# Patient Record
Sex: Female | Born: 1960 | Race: White | Hispanic: No | Marital: Married | Smoking: Never smoker
Health system: Southern US, Community
[De-identification: ages and names within clinical notes are randomized; demographics above are authoritative.]

## PROBLEM LIST (undated history)

## (undated) DIAGNOSIS — K610 Anal abscess: Secondary | ICD-10-CM

## (undated) DIAGNOSIS — R32 Unspecified urinary incontinence: Secondary | ICD-10-CM

## (undated) DIAGNOSIS — G4730 Sleep apnea, unspecified: Secondary | ICD-10-CM

## (undated) DIAGNOSIS — J309 Allergic rhinitis, unspecified: Secondary | ICD-10-CM

## (undated) DIAGNOSIS — I872 Venous insufficiency (chronic) (peripheral): Secondary | ICD-10-CM

## (undated) DIAGNOSIS — K219 Gastro-esophageal reflux disease without esophagitis: Secondary | ICD-10-CM

## (undated) DIAGNOSIS — N1330 Unspecified hydronephrosis: Secondary | ICD-10-CM

## (undated) DIAGNOSIS — T8859XA Other complications of anesthesia, initial encounter: Secondary | ICD-10-CM

## (undated) DIAGNOSIS — N200 Calculus of kidney: Secondary | ICD-10-CM

## (undated) DIAGNOSIS — Z8669 Personal history of other diseases of the nervous system and sense organs: Secondary | ICD-10-CM

## (undated) DIAGNOSIS — Z87442 Personal history of urinary calculi: Secondary | ICD-10-CM

## (undated) DIAGNOSIS — Z860100 Personal history of colon polyps, unspecified: Secondary | ICD-10-CM

## (undated) DIAGNOSIS — E559 Vitamin D deficiency, unspecified: Secondary | ICD-10-CM

## (undated) DIAGNOSIS — G4733 Obstructive sleep apnea (adult) (pediatric): Secondary | ICD-10-CM

## (undated) DIAGNOSIS — N809 Endometriosis, unspecified: Secondary | ICD-10-CM

## (undated) DIAGNOSIS — R319 Hematuria, unspecified: Secondary | ICD-10-CM

## (undated) DIAGNOSIS — C801 Malignant (primary) neoplasm, unspecified: Secondary | ICD-10-CM

## (undated) DIAGNOSIS — K649 Unspecified hemorrhoids: Secondary | ICD-10-CM

## (undated) DIAGNOSIS — T4145XA Adverse effect of unspecified anesthetic, initial encounter: Secondary | ICD-10-CM

## (undated) DIAGNOSIS — M1990 Unspecified osteoarthritis, unspecified site: Secondary | ICD-10-CM

## (undated) DIAGNOSIS — T7840XA Allergy, unspecified, initial encounter: Secondary | ICD-10-CM

## (undated) HISTORY — DX: Personal history of colon polyps, unspecified: Z86.0100

## (undated) HISTORY — PX: LAPAROSCOPIC GASTRIC BANDING: SHX1100

## (undated) HISTORY — DX: Unspecified hydronephrosis: N13.30

## (undated) HISTORY — DX: Allergy, unspecified, initial encounter: T78.40XA

## (undated) HISTORY — DX: Vitamin D deficiency, unspecified: E55.9

## (undated) HISTORY — PX: VAGINAL HYSTERECTOMY: SUR661

## (undated) HISTORY — DX: Allergic rhinitis, unspecified: J30.9

## (undated) HISTORY — DX: Calculus of kidney: N20.0

## (undated) HISTORY — DX: Malignant (primary) neoplasm, unspecified: C80.1

## (undated) HISTORY — DX: Unspecified osteoarthritis, unspecified site: M19.90

## (undated) HISTORY — DX: Personal history of colonic polyps: Z86.010

## (undated) HISTORY — DX: Sleep apnea, unspecified: G47.30

## (undated) HISTORY — PX: BREAST CYST EXCISION: SHX579

## (undated) HISTORY — DX: Unspecified urinary incontinence: R32

## (undated) HISTORY — PX: DILATION AND CURETTAGE OF UTERUS: SHX78

## (undated) HISTORY — DX: Endometriosis, unspecified: N80.9

## (undated) HISTORY — DX: Obstructive sleep apnea (adult) (pediatric): G47.33

## (undated) HISTORY — DX: Anal abscess: K61.0

## (undated) HISTORY — DX: Unspecified hemorrhoids: K64.9

## (undated) HISTORY — DX: Venous insufficiency (chronic) (peripheral): I87.2

## (undated) HISTORY — PX: HERNIA REPAIR: SHX51

## (undated) HISTORY — DX: Gastro-esophageal reflux disease without esophagitis: K21.9

## (undated) HISTORY — PX: JOINT REPLACEMENT: SHX530

## (undated) HISTORY — PX: SMALL INTESTINE SURGERY: SHX150

## (undated) HISTORY — PX: ABDOMINAL SURGERY: SHX537

## (undated) HISTORY — PX: LAPAROSCOPIC CHOLECYSTECTOMY: SUR755

## (undated) HISTORY — PX: COLONOSCOPY W/ POLYPECTOMY: SHX1380

## (undated) HISTORY — PX: HIATAL HERNIA REPAIR: SHX195

---

## 1999-11-24 ENCOUNTER — Encounter: Payer: Self-pay | Admitting: Endocrinology

## 1999-11-24 ENCOUNTER — Ambulatory Visit (HOSPITAL_COMMUNITY): Admission: RE | Admit: 1999-11-24 | Discharge: 1999-11-24 | Payer: Self-pay | Admitting: Endocrinology

## 2000-02-27 ENCOUNTER — Ambulatory Visit (HOSPITAL_COMMUNITY): Admission: RE | Admit: 2000-02-27 | Discharge: 2000-02-28 | Payer: Self-pay | Admitting: General Surgery

## 2000-02-27 ENCOUNTER — Encounter: Payer: Self-pay | Admitting: General Surgery

## 2000-02-27 ENCOUNTER — Encounter (INDEPENDENT_AMBULATORY_CARE_PROVIDER_SITE_OTHER): Payer: Self-pay | Admitting: *Deleted

## 2005-12-18 ENCOUNTER — Ambulatory Visit: Payer: Self-pay | Admitting: Internal Medicine

## 2005-12-25 ENCOUNTER — Ambulatory Visit: Payer: Self-pay | Admitting: Internal Medicine

## 2007-03-05 ENCOUNTER — Ambulatory Visit: Payer: Self-pay | Admitting: Internal Medicine

## 2007-03-06 ENCOUNTER — Encounter: Payer: Self-pay | Admitting: Internal Medicine

## 2007-03-06 LAB — CONVERTED CEMR LAB
ALT: 30 units/L (ref 0–40)
AST: 25 units/L (ref 0–37)
Alkaline Phosphatase: 59 units/L (ref 39–117)
BUN: 13 mg/dL (ref 6–23)
CO2: 27 meq/L (ref 19–32)
Chloride: 106 meq/L (ref 96–112)
Creatinine, Ser: 0.7 mg/dL (ref 0.4–1.2)
Nitrite: NEGATIVE
Potassium: 3.9 meq/L (ref 3.5–5.1)
Pro B Natriuretic peptide (BNP): 28 pg/mL (ref 0.0–100.0)
Total Bilirubin: 0.7 mg/dL (ref 0.3–1.2)
Total Protein: 6.2 g/dL (ref 6.0–8.3)
Urine Glucose: NEGATIVE mg/dL

## 2007-03-12 ENCOUNTER — Encounter: Admission: RE | Admit: 2007-03-12 | Discharge: 2007-03-12 | Payer: Self-pay | Admitting: Internal Medicine

## 2007-03-13 ENCOUNTER — Ambulatory Visit: Payer: Self-pay

## 2007-04-02 ENCOUNTER — Ambulatory Visit: Payer: Self-pay | Admitting: Internal Medicine

## 2007-05-28 ENCOUNTER — Ambulatory Visit: Payer: Self-pay | Admitting: Internal Medicine

## 2007-06-26 ENCOUNTER — Ambulatory Visit: Payer: Self-pay | Admitting: Internal Medicine

## 2007-06-26 LAB — CONVERTED CEMR LAB
ALT: 50 units/L — ABNORMAL HIGH (ref 0–35)
Albumin: 3.7 g/dL (ref 3.5–5.2)
Alkaline Phosphatase: 64 units/L (ref 39–117)
BUN: 17 mg/dL (ref 6–23)
Basophils Relative: 0.5 % (ref 0.0–1.0)
Calcium: 9.1 mg/dL (ref 8.4–10.5)
Direct LDL: 147.3 mg/dL
Eosinophils Relative: 1.8 % (ref 0.0–5.0)
GFR calc non Af Amer: 72 mL/min
Glucose, Bld: 113 mg/dL — ABNORMAL HIGH (ref 70–99)
HDL: 30 mg/dL — ABNORMAL LOW (ref >39.0)
Iron: 94 ug/dL (ref 42–145)
Monocytes Relative: 8.1 % (ref 3.0–11.0)
Platelets: 282 10*3/uL (ref 150–400)
Potassium: 4.3 meq/L (ref 3.5–5.1)
RBC: 4.57 M/uL (ref 3.87–5.11)
RDW: 13.3 % (ref 11.5–14.6)
TSH: 2.16 microintl units/mL (ref 0.35–5.50)
Total Bilirubin: 0.8 mg/dL (ref 0.3–1.2)
Total CHOL/HDL Ratio: 7.1
Total Protein: 6.8 g/dL (ref 6.0–8.3)
Triglycerides: 189 mg/dL — ABNORMAL HIGH (ref 0–149)
VLDL: 38 mg/dL (ref 0–40)
WBC: 8.2 10*3/uL (ref 4.5–10.5)

## 2007-07-02 ENCOUNTER — Ambulatory Visit: Payer: Self-pay | Admitting: Internal Medicine

## 2007-07-05 ENCOUNTER — Encounter: Payer: Self-pay | Admitting: Internal Medicine

## 2007-07-05 DIAGNOSIS — I872 Venous insufficiency (chronic) (peripheral): Secondary | ICD-10-CM | POA: Insufficient documentation

## 2007-07-05 DIAGNOSIS — N1330 Unspecified hydronephrosis: Secondary | ICD-10-CM

## 2007-07-05 DIAGNOSIS — G4733 Obstructive sleep apnea (adult) (pediatric): Secondary | ICD-10-CM | POA: Insufficient documentation

## 2007-07-05 DIAGNOSIS — E559 Vitamin D deficiency, unspecified: Secondary | ICD-10-CM

## 2007-07-05 DIAGNOSIS — K8020 Calculus of gallbladder without cholecystitis without obstruction: Secondary | ICD-10-CM | POA: Insufficient documentation

## 2007-07-05 HISTORY — DX: Venous insufficiency (chronic) (peripheral): I87.2

## 2007-07-09 ENCOUNTER — Ambulatory Visit (HOSPITAL_COMMUNITY): Admission: RE | Admit: 2007-07-09 | Discharge: 2007-07-09 | Payer: Self-pay | Admitting: *Deleted

## 2007-07-16 ENCOUNTER — Ambulatory Visit (HOSPITAL_COMMUNITY): Admission: RE | Admit: 2007-07-16 | Discharge: 2007-07-16 | Payer: Self-pay | Admitting: *Deleted

## 2007-07-31 ENCOUNTER — Encounter: Admission: RE | Admit: 2007-07-31 | Discharge: 2007-10-29 | Payer: Self-pay | Admitting: *Deleted

## 2007-08-28 ENCOUNTER — Ambulatory Visit (HOSPITAL_COMMUNITY): Admission: RE | Admit: 2007-08-28 | Discharge: 2007-08-28 | Payer: Self-pay | Admitting: *Deleted

## 2007-11-27 HISTORY — PX: LAPAROSCOPIC GASTRIC BANDING: SHX1100

## 2007-12-25 ENCOUNTER — Encounter: Admission: RE | Admit: 2007-12-25 | Discharge: 2008-03-24 | Payer: Self-pay | Admitting: *Deleted

## 2008-01-08 ENCOUNTER — Encounter: Payer: Self-pay | Admitting: Internal Medicine

## 2008-01-12 ENCOUNTER — Ambulatory Visit (HOSPITAL_COMMUNITY): Admission: RE | Admit: 2008-01-12 | Discharge: 2008-01-13 | Payer: Self-pay | Admitting: *Deleted

## 2008-02-02 ENCOUNTER — Encounter: Payer: Self-pay | Admitting: Internal Medicine

## 2008-02-06 ENCOUNTER — Ambulatory Visit: Payer: Self-pay | Admitting: Internal Medicine

## 2008-02-13 LAB — CONVERTED CEMR LAB
ALT: 74 units/L — ABNORMAL HIGH (ref 0–35)
AST: 39 units/L — ABNORMAL HIGH (ref 0–37)
Alkaline Phosphatase: 65 units/L (ref 39–117)
BUN: 11 mg/dL (ref 6–23)
Bilirubin, Direct: 0.1 mg/dL (ref 0.0–0.3)
Calcium: 9.1 mg/dL (ref 8.4–10.5)
Chloride: 108 meq/L (ref 96–112)
GFR calc Af Amer: 116 mL/min
GFR calc non Af Amer: 96 mL/min
Glucose, Bld: 97 mg/dL (ref 70–99)

## 2008-03-26 ENCOUNTER — Ambulatory Visit: Payer: Self-pay | Admitting: Internal Medicine

## 2008-03-26 DIAGNOSIS — F4321 Adjustment disorder with depressed mood: Secondary | ICD-10-CM | POA: Insufficient documentation

## 2008-03-26 DIAGNOSIS — M1990 Unspecified osteoarthritis, unspecified site: Secondary | ICD-10-CM | POA: Insufficient documentation

## 2008-03-26 DIAGNOSIS — Z9884 Bariatric surgery status: Secondary | ICD-10-CM

## 2008-03-26 DIAGNOSIS — J309 Allergic rhinitis, unspecified: Secondary | ICD-10-CM | POA: Insufficient documentation

## 2008-08-10 ENCOUNTER — Ambulatory Visit: Payer: Self-pay | Admitting: Internal Medicine

## 2008-08-10 DIAGNOSIS — J019 Acute sinusitis, unspecified: Secondary | ICD-10-CM

## 2008-08-10 DIAGNOSIS — N63 Unspecified lump in unspecified breast: Secondary | ICD-10-CM

## 2008-08-13 ENCOUNTER — Telehealth (INDEPENDENT_AMBULATORY_CARE_PROVIDER_SITE_OTHER): Payer: Self-pay | Admitting: *Deleted

## 2008-08-19 ENCOUNTER — Encounter: Payer: Self-pay | Admitting: Internal Medicine

## 2008-09-13 ENCOUNTER — Telehealth: Payer: Self-pay | Admitting: Internal Medicine

## 2008-09-14 ENCOUNTER — Ambulatory Visit: Payer: Self-pay | Admitting: Internal Medicine

## 2008-09-14 DIAGNOSIS — L02219 Cutaneous abscess of trunk, unspecified: Secondary | ICD-10-CM

## 2008-09-14 DIAGNOSIS — L03319 Cellulitis of trunk, unspecified: Secondary | ICD-10-CM

## 2008-10-14 ENCOUNTER — Encounter: Payer: Self-pay | Admitting: Internal Medicine

## 2009-05-11 ENCOUNTER — Encounter: Admission: RE | Admit: 2009-05-11 | Discharge: 2009-05-11 | Payer: Self-pay | Admitting: Surgery

## 2009-07-26 ENCOUNTER — Ambulatory Visit (HOSPITAL_COMMUNITY): Admission: RE | Admit: 2009-07-26 | Discharge: 2009-07-26 | Payer: Self-pay | Admitting: Surgery

## 2010-04-11 ENCOUNTER — Ambulatory Visit: Payer: Self-pay | Admitting: Internal Medicine

## 2010-04-14 ENCOUNTER — Encounter: Payer: Self-pay | Admitting: Internal Medicine

## 2010-11-26 HISTORY — PX: OTHER SURGICAL HISTORY: SHX169

## 2010-12-28 NOTE — Letter (Signed)
Summary: [facility]   Imported By: Lester Boise 04/26/2010 11:04:31  _____________________________________________________________________  External Attachment:    Type:   Image     Comment:   External Document

## 2010-12-28 NOTE — Assessment & Plan Note (Signed)
Summary: SINUS/NWS   Vital Signs:  Patient profile:   50 year old female Weight:      247.50 pounds O2 Sat:      98 % on Room air Temp:     98.7 degrees F oral Pulse rate:   59 / minute BP sitting:   130 / 74  (left arm) Cuff size:   regular  Vitals Entered By: Lucious Groves (Apr 11, 2010 9:07 AM)  O2 Flow:  Room air CC: C/O sinus issues x4 days./kb Is Patient Diabetic? No Pain Assessment Patient in pain? no      Comments Patient notes dizziness with taking Sudafed and that she has had fever, HA, and yellow mucous production./kb   CC:  C/O sinus issues x4 days./kb.  History of Present Illness: The patient presents with complaints of sore throat, fever, sinus congestion and drainge of several days duration. Not better with OTC meds.  Muscle aches are not  present.  The mucus is colored. C/o allergies. F/u obesity - she had to have a lapband repaired last August   Current Medications (verified): 1)  None  Allergies (verified): 1)  Biaxin  Past History:  Past Medical History: Last updated: 03/26/2008 Vit D def LE ven insuff Elev LFTs ? fatty liver Osteoarthritis Allergic rhinitis Morbid obesity OSA on Cpap  Past Surgical History: Hysterectomy Lap band 2009 w/a repair in 2010  Social History: Reviewed history from 03/26/2008 and no changes required. Occupation:post office Married Never Smoked  Review of Systems       The patient complains of fever.  The patient denies chest pain, prolonged cough, and abdominal pain.         Lost wt  Physical Exam  General:  overweight-appearing.   Nose:  nasal dischargemucosal pallor and mucosal erythema.   Mouth:  pharyngeal erythema and fair dentition.   Lungs:  Normal respiratory effort, chest expands symmetrically. Lungs are clear to auscultation, no crackles or wheezes. Heart:  Normal rate and regular rhythm. S1 and S2 normal without gallop, murmur, click, rub or other extra sounds. Abdomen:  soft.   Skin:   WNL Psych:  Oriented X3.     Impression & Recommendations:  Problem # 1:  SINUSITIS- ACUTE-NOS (ICD-461.9) Assessment New Zpac  Problem # 2:  ALLERGIC RHINITIS (ICD-477.9) Assessment: Deteriorated  Her updated medication list for this problem includes:    Sudafed 12 Hour 120 Mg Xr12h-tab (Pseudoephedrine hcl) .Marland Kitchen... 1 by mouth two times a day as needed allergies    Loratadine 10 Mg Tabs (Loratadine) .Marland Kitchen... 1 by mouth once daily as needed allergies  Problem # 3:  OBESITY, MORBID (ICD-278.01) Assessment: Improved  Problem # 4:  VITAMIN D DEFICIENCY (ICD-268.9) Assessment: Comment Only Restart Vit D Risks of noncompliance with treatment discussed. Compliance encouraged.   Complete Medication List: 1)  Vitamin D 1000 Unit Tabs (Cholecalciferol) .Marland Kitchen.. 1 by mouth qd 2)  Ibuprofen 600 Mg Tabs (Ibuprofen) .Marland Kitchen.. 1 by mouth bid  pc x 1 wk then as needed for  pain 3)  Zithromax Z-pak 250 Mg Tabs (Azithromycin) .... As dirrected 4)  Sudafed 12 Hour 120 Mg Xr12h-tab (Pseudoephedrine hcl) .Marland Kitchen.. 1 by mouth two times a day as needed allergies 5)  Loratadine 10 Mg Tabs (Loratadine) .Marland Kitchen.. 1 by mouth once daily as needed allergies  Patient Instructions: 1)  Use over-the-counter medicines for "cold": Tylenol  650mg  or Advil 400mg  every 6 hours  for fever; Delsym or Robutussin for cough. Mucinex or Mucinex D for congestion.  Ricola or Halls for sore throat. Office visit if not better or if worse. 2)  Sch wellness Prescriptions: LORATADINE 10 MG TABS (LORATADINE) 1 by mouth once daily as needed allergies  #30 x 6   Entered and Authorized by:   Tresa Garter MD   Signed by:   Tresa Garter MD on 04/11/2010   Method used:   Print then Give to Patient   RxID:   [id number] SUDAFED 12 HOUR 120 MG XR12H-TAB (PSEUDOEPHEDRINE HCL) 1 by mouth two times a day as needed allergies  #60 x 6   Entered and Authorized by:   Tresa Garter MD   Signed by:   Tresa Garter MD on 04/11/2010    Method used:   Print then Give to Patient   RxID:   [id number] ZITHROMAX Z-PAK 250 MG TABS (AZITHROMYCIN) as dirrected  #1 x 0   Entered and Authorized by:   Tresa Garter MD   Signed by:   Tresa Garter MD on 04/11/2010   Method used:   Print then Give to Patient   RxID:   [id number] IBUPROFEN 600 MG TABS (IBUPROFEN) 1 by mouth bid  pc x 1 wk then as needed for  pain  #60 x 6   Entered and Authorized by:   Tresa Garter MD   Signed by:   Tresa Garter MD on 04/11/2010   Method used:   Print then Give to Patient   RxID:   [id number] VITAMIN D 1000 UNIT TABS (CHOLECALCIFEROL) 1 by mouth qd  #100 x 3   Entered and Authorized by:   Tresa Garter MD   Signed by:   Tresa Garter MD on 04/11/2010   Method used:   Print then Give to Patient   RxID:   [id number]

## 2011-03-03 LAB — BASIC METABOLIC PANEL
BUN: 11 mg/dL (ref 6–23)
CO2: 25 mEq/L (ref 19–32)
Chloride: 107 mEq/L (ref 96–112)
Glucose, Bld: 103 mg/dL — ABNORMAL HIGH (ref 70–99)
Potassium: 4.3 mEq/L (ref 3.5–5.1)
Sodium: 138 mEq/L (ref 135–145)

## 2011-04-10 NOTE — Op Note (Signed)
Latasha, Hicks            ACCOUNT NO.:  [id number]   MEDICAL RECORD NO.:  [id number]          PATIENT TYPE:  AMB   LOCATION:  DAY                          FACILITY:  [facility]   PHYSICIAN:  Thornton Park. Daphine Deutscher, MD  DATE OF BIRTH:  1961/05/19   DATE OF PROCEDURE:  07/26/2009  DATE OF DISCHARGE:                               OPERATIVE REPORT   PREOPERATIVE DIAGNOSIS:  Leaking lap band port.   POSTOPERATIVE DIAGNOSIS:  Leaking lap band port.   PROCEDURE:  Replacement of lap band port with removal of skin tags  beneath the breast and axilla.   SURGEON:  Dr. Daphine Deutscher   ASSISTANT:  Dr. Ezzard Standing   ANESTHESIA:  General.   DESCRIPTION OF PROCEDURE:  The patient was taken to room one on 08/31  and given general anesthesia.  The abdomen was prepped with a Techni-  Care equivalent and draped sterilely.  First I excised her old scar over  her band port and went down and explanted this.  It was covered in a  pretty much hard fibrin sheath and I went ahead and freed this up.  When  I had it freed from the connection, I attempted to fill and withdrawn  and it leaked out and it was there that I noticed that the port and its  connection tubing which is all the single piece that comes separately  had a lateral opening in the tubing.  I removed this and got a fresh  port and tubing and amputated a little farther down on the joint and  then reinstalled it.  The band was then flushed with about 10 mL of  saline and the bulk of that was removed.  I left about 4-5 mL in the  band.  The Marlex mesh was sutured to the back of the port which was  then implanted in a subcutaneous pocket just lateral to the lateral edge  of the port site.  The wounds were closed with 4-0 Vicryl, Benzoin and  Steri-Strips.   Next I snipped off about 16 little skin tags beneath both breasts and in  the axilla.  These were dressed with Neosporin ointment.  The patient  tolerated the procedure well and was taken to the   recovery room in  satisfactory condition.  She will be given some Lortab elixir to take if  needed for pain.      Thornton Park Daphine Deutscher, MD  Electronically Signed     MBM/MEDQ  D:  07/26/2009  T:  07/26/2009  Job:  [id number]

## 2011-04-10 NOTE — Letter (Signed)
May 17, 2007    [facility]  Attn:  Okey Regal  [address]  [place], [place] [address]   RE:  Latasha Hicks, Latasha Hicks  MRN:  [id number]  /  DOB:  21-Jun-1961   Ms. Latasha Hicks has been a patient of mine for a number of years.  She has  been suffering with morbid obesity.  I saw her last on Apr 02, 2007.  Her  weight was 312 pounds with a height of 5 feet 7 inches and a body mass  index around 50.  She has developed lower extremity chronic venous  insufficiency, sleep apnea, and elevated blood pressure as the result of  her morbid obesity.  She is highly motivated to have a lap-band surgery.  I strongly support her in this endeavor and highly recommend a lap-band  surgery.  She is very motivated.    Sincerely,      Latasha Quint. Plotnikov, MD  Electronically Signed    AVP/MedQ  DD: 05/17/2007  DT: 05/17/2007  Job #: [id number]

## 2011-04-10 NOTE — Op Note (Signed)
NAMELILYANN, Latasha Hicks            ACCOUNT NO.:  [id number]   MEDICAL RECORD NO.:  [id number]          PATIENT TYPE:  OIB   LOCATION:  1527                         FACILITY:  [facility]   PHYSICIAN:  Thornton Park. Daphine Deutscher, MD  DATE OF BIRTH:  February 21, 1961   DATE OF PROCEDURE:  01/12/2008  DATE OF DISCHARGE:                               OPERATIVE REPORT   PREOPERATIVE DIAGNOSIS:  Morbid obesity with obstructive sleep apnea,  hypertension, osteoarthritis and a hiatal hernia by upper GI.   PROCEDURE:  Laparoscopic repair of hiatal hernia posteriorly and  placement of APS band.   SURGEON:  Luretha Murphy, M.D.   ASSISTANT:  Lorne Skeens. Hoxworth, M.D.   ANESTHESIA:  General endotracheal.   DESCRIPTION OF PROCEDURE:  [name] is a 50 year old lady  brought to OR 1 on January 12, 2008 and given general anesthesia.  The  abdomen was prepped with Techni-Care and draped sterilely.  I entered  the abdomen through the left upper quadrant using 0 degrees 10 mm scope  with OptiVu technique without difficulty.  The abdomen was insufflated.  The standard trocar placements were used including a 15 in the right  upper port to just slightly to the right of midline for introduction of  the band.  The Nathanson's retractor was placed beneath the liver.  The  patient was then placed in steep head up.  Proximal dissection was done  using the harmonic scalpel and the esophagogastric junction was noted  and taken down.  We reduced an anterior and a posterior large lipoma  occupying a lot of the hiatus but allowing her to have a sliding hiatal  hernia.  These were removed.  We then repaired this with two posterior  sutures where I could see the hiatus both the right and left.  These  were tied and brought nice coaptation of the posterior hiatus.   I then passed the band passer in the usual spot in the lower port on the  right, easily coming through and then brought the APS band around.  This  was then  plicated anteriorly pulling a little bit of the fundus up  underneath that as it was riding a little higher.  Three sutures were  used to plicate the stomach up on top of the band.  It appeared to be in  good location and rotated easily.  We also used a band calibration tube  which was placed after snapping it to make sure it was not too tight.   It was then cut and brought out through the umbilicus where I had made a  pocket on the lower side and connected it to the port and then sewed  that in place with four interrupted 2-0 Prolenes.  Wounds were irrigated  with saline and I drizzled some lidocaine in each of the ports and these  were closed with 4-0 Vicryl and also with staples.  The patient seemed  to tolerate the procedure well and taken to recovery in satisfactory  addition.      Thornton Park Daphine Deutscher, MD  Electronically Signed     MBM/MEDQ  D:  01/12/2008  T:  01/13/2008  Job:  [id number]   cc:   Georgina Quint. Plotnikov, MD  [address]  [place]  [place] [address]

## 2011-04-13 NOTE — Op Note (Signed)
[place]  Patient:    Latasha Hicks, Latasha Hicks                   MRN: [id number] Proc. Date: 02/27/00 Adm. Date:  [id number] Attending:  Henrene Dodge CC:         Anselm Pancoast. Zachery Dakins, M.D.                           Operative Report  PREOPERATIVE DIAGNOSIS:  Chronic cholecystitis.  POSTOPERATIVE DIAGNOSIS:  Chronic cholecystitis.  OPERATION:  Laparoscopic cholecystectomy with cholangiogram.  SURGEON:  Anselm Pancoast. Zachery Dakins, M.D.  ASSISTANT:  Donnie Coffin. Samuella Cota, M.D.  ANESTHESIA:  General.  INDICATIONS:  Latasha Hicks is a 50 year old overweight Caucasian female referred by the [facility] for management of symptomatic gallstones.  She has had a long history of indigestion with epigastric type discomfort, thought probably secondary to overweight and esophageal reflux, but then starting having more definite symptoms, had a slightly elevated SGPT and Dr. Posey Rea scheduled her for an ultrasound of the gallbladder.  The ultrasound showed stones within her gallbladder and she was referred to me for management of these gallbladder problems.  She is on a reflux type medications.  DESCRIPTION OF PROCEDURE:  Preoperative she was given 3 g of Unasyn and taken to operative suite.  She has PAS stockings.  The was patient given induction of general anesthesia and endotracheal tube and then her abdomen was prepped with Betadine and surgical scrubbing solution, draped in a sterile manner.  She has ad a previous laparoscopic GYN procedure and we went through the same incision. She has a very thick panniculus and abdominal wall.  The fascia was identified and his was picked up with the two [facility], a small opening make, the underlying peritoneum identified, and then opened into the peritoneal cavity.  The single traction suture was placed and Hasson cannula introduced and then the camera inserted.  The gallbladder was easily visualized, was not  acutely inflamed and no adhesions around it.  The upper 10 mm trocar was placed and the two lateral 5 mm trocars were placed by Dr. Samuella Cota.  Because of her size, the gallbladder was very difficult and we could retract it upward and outward and could identify the junction of the gallbladder and the cystic duct and the cystic duct was over about a cm in size and length, and I was able to put a clip on the gallbladder flush with the cystic duct.  What I  thought was the cystic artery was visualized and this was doubly clipped proximally and singly distally, and then we used a taupe cholangiocath to obtain and evacuate. There was good flow of the extrahepatic biliary system or prompt filling into the duodenum.  No evidence of any common duct stones and a very short cystic duct. On reinserting the camera there was a little bit of bleeding in the area of and at  this time we switched to a 30 degree scope so we could see the area better, and  took the little catheter out, triply and doubly clipped the cystic duct and divided it and then divided the little area where we thought was the cystic artery. This was really a little lymphatic and the artery was right behind it and I sort of freed it up and then doubly clipped it proximally, singly, distally and divided it. The little bleeding had been coming from a little area of the peritoneum  just lateral to the gallbladder ______ traction and we sort of teased the gallbladder out of the bed using a ______ hook electrocautery and was confident there was no evidence of any active bleeding.  At this point we switched back to the regular  straight viewing scope for better visualization and then when we had it free it the gallbladder controlling the bed with the cautery and then placed the gallbladder in EndoCatch bag.  Reinspection, all the irrigated fluid had been aspirated.  We then switched the camera to the upper 10 mm port and withdrew  the bag containing the  stones with the Hasson cannula.  There were fairly marble sized stones within the gallbladder and then we closed the fascia, the umbilicus with two figure-of-eight of 0 Vicryl.  Next, the little remaining irrigating fluid was removed.  She was  flattened and then we withdrew the carbon dioxide and then removed the upper 10 mm trocar.  The subcutaneous wounds were closed with 4-0 Vicryl, benzoin and Steri-Strips on the skin, and the patient was extubated and taken to recovery room in stable postop condition. DD:  02/27/00 TD:  02/27/00 Job: [id number]

## 2011-06-22 ENCOUNTER — Telehealth: Payer: Self-pay | Admitting: *Deleted

## 2011-06-22 DIAGNOSIS — N200 Calculus of kidney: Secondary | ICD-10-CM

## 2011-06-22 NOTE — Telephone Encounter (Signed)
Bring stone to lab pls Keep ROV Thx

## 2011-06-22 NOTE — Telephone Encounter (Signed)
Pt passed kidney stone this afternoon. She would like to know if it needs to be sent to lab? AND any further treatment or referral needed?

## 2011-06-25 NOTE — Telephone Encounter (Signed)
Left detailed vm on pt's home # OK per [facility] form.

## 2011-06-26 ENCOUNTER — Other Ambulatory Visit: Payer: Self-pay

## 2011-06-26 ENCOUNTER — Other Ambulatory Visit: Payer: Self-pay | Admitting: Internal Medicine

## 2011-07-01 LAB — STONE ANALYSIS: Stone Weight KSTONE: 0.011 g

## 2011-08-17 LAB — CBC
Hemoglobin: 13.2
MCHC: 35.1
RBC: 4.16
WBC: 9.6

## 2011-08-17 LAB — BASIC METABOLIC PANEL
BUN: 17
Chloride: 108
GFR calc Af Amer: >60
GFR calc non Af Amer: >60
Potassium: 4.8
Sodium: 143

## 2011-08-17 LAB — DIFFERENTIAL
Basophils Relative: 0
Lymphocytes Relative: 8 — ABNORMAL LOW
Monocytes Absolute: 0.5
Monocytes Relative: 5
Neutro Abs: 8.3 — ABNORMAL HIGH
Neutrophils Relative %: 87 — ABNORMAL HIGH

## 2011-08-17 LAB — HEMOGLOBIN AND HEMATOCRIT, BLOOD
HCT: 42
Hemoglobin: 14.5

## 2012-01-29 ENCOUNTER — Telehealth (INDEPENDENT_AMBULATORY_CARE_PROVIDER_SITE_OTHER): Payer: Self-pay | Admitting: Surgery

## 2012-01-29 NOTE — Telephone Encounter (Signed)
01/29/12 mailed recall letter for bariatric surgery f/u to patient. Adv pt to call [facility] @ [phone] to schedule an appointment....cef 

## 2013-03-05 ENCOUNTER — Telehealth (INDEPENDENT_AMBULATORY_CARE_PROVIDER_SITE_OTHER): Payer: Self-pay | Admitting: Surgery

## 2013-03-05 NOTE — Telephone Encounter (Signed)
03/05/13 lm and mailed recall letter for pt to schedule a bariatric follow-up appt. Dr. Daphine Deutscher did lap band surgery 01/12/08. (lss)

## 2013-05-01 ENCOUNTER — Other Ambulatory Visit (INDEPENDENT_AMBULATORY_CARE_PROVIDER_SITE_OTHER): Payer: Federal, State, Local not specified - PPO

## 2013-05-01 ENCOUNTER — Encounter: Payer: Self-pay | Admitting: Internal Medicine

## 2013-05-01 ENCOUNTER — Ambulatory Visit (INDEPENDENT_AMBULATORY_CARE_PROVIDER_SITE_OTHER): Payer: Federal, State, Local not specified - PPO | Admitting: Internal Medicine

## 2013-05-01 VITALS — BP 110/80 | HR 80 | Temp 98.5°F | Resp 16 | Ht 67.0 in | Wt 224.0 lb

## 2013-05-01 DIAGNOSIS — Z0000 Encounter for general adult medical examination without abnormal findings: Secondary | ICD-10-CM

## 2013-05-01 DIAGNOSIS — Z9884 Bariatric surgery status: Secondary | ICD-10-CM

## 2013-05-01 DIAGNOSIS — K610 Anal abscess: Secondary | ICD-10-CM

## 2013-05-01 DIAGNOSIS — K612 Anorectal abscess: Secondary | ICD-10-CM

## 2013-05-01 LAB — BASIC METABOLIC PANEL
BUN: 12 mg/dL (ref 6–23)
CO2: 26 mEq/L (ref 19–32)
Chloride: 108 mEq/L (ref 96–112)
Potassium: 4.4 mEq/L (ref 3.5–5.1)

## 2013-05-01 LAB — URINALYSIS
Ketones, ur: NEGATIVE
Leukocytes, UA: NEGATIVE
Specific Gravity, Urine: 1.025 (ref 1.000–1.030)
Total Protein, Urine: NEGATIVE
Urine Glucose: NEGATIVE
pH: 6 (ref 5.0–8.0)

## 2013-05-01 LAB — CBC WITH DIFFERENTIAL/PLATELET
Basophils Absolute: 0 10*3/uL (ref 0.0–0.1)
Basophils Relative: 0.5 % (ref 0.0–3.0)
Eosinophils Absolute: 0.1 10*3/uL (ref 0.0–0.7)
Eosinophils Relative: 1.3 % (ref 0.0–5.0)
HCT: 43.8 % (ref 36.0–46.0)
Lymphs Abs: 1.7 10*3/uL (ref 0.7–4.0)
MCHC: 33.8 g/dL (ref 30.0–36.0)
MCV: 92.9 fl (ref 78.0–100.0)
Monocytes Absolute: 0.5 10*3/uL (ref 0.1–1.0)
RBC: 4.72 Mil/uL (ref 3.87–5.11)
WBC: 6.4 10*3/uL (ref 4.5–10.5)

## 2013-05-01 LAB — LIPID PANEL: Total CHOL/HDL Ratio: 6

## 2013-05-01 LAB — HEPATIC FUNCTION PANEL
AST: 23 U/L (ref 0–37)
Alkaline Phosphatase: 43 U/L (ref 39–117)
Bilirubin, Direct: 0.1 mg/dL (ref 0.0–0.3)
Total Bilirubin: 1 mg/dL (ref 0.3–1.2)

## 2013-05-01 MED ORDER — DOXYCYCLINE HYCLATE 100 MG PO TABS: 100.0000 mg | ORAL_TABLET | Freq: Two times a day (BID) | ORAL | Status: DC

## 2013-05-01 MED ORDER — MUPIROCIN 2 % EX OINT: TOPICAL_OINTMENT | CUTANEOUS | Status: DC

## 2013-05-01 NOTE — Progress Notes (Signed)
  Subjective:    Patient ID: Latasha Hicks, female    DOB: Nov 28, 1960, 52 y.o.   MRN: [id number]  HPI C/o abscess x 1 month   Review of Systems Wt Readings from Last 3 Encounters:  05/01/13 224 lb (101.606 kg)  04/11/10 247 lb 8 oz (112.265 kg)  09/14/08 262 lb (118.842 kg)       Objective:   Physical Exam        Assessment & Plan:

## 2013-05-01 NOTE — Assessment & Plan Note (Signed)
6/14 7 o'clock Doxy x 10 d Mupirocin bid

## 2013-05-01 NOTE — Patient Instructions (Signed)
Wt Readings from Last 3 Encounters:  05/01/13 224 lb (101.606 kg)  04/11/10 247 lb 8 oz (112.265 kg)  09/14/08 262 lb (118.842 kg)   BP Readings from Last 3 Encounters:  05/01/13 110/80  04/11/10 130/74  09/14/08 126/78   Sitz baths x 2-3 days

## 2013-05-04 ENCOUNTER — Telehealth: Payer: Self-pay | Admitting: Internal Medicine

## 2013-05-04 NOTE — Telephone Encounter (Signed)
Pt informed

## 2013-05-04 NOTE — Telephone Encounter (Signed)
Latasha Hicks, please, inform patient that all labs are normal except for elev cholesterol. Thx

## 2013-05-05 NOTE — Assessment & Plan Note (Signed)
Wt Readings from Last 3 Encounters:  05/01/13 224 lb (101.606 kg)  04/11/10 247 lb 8 oz (112.265 kg)  09/14/08 262 lb (118.842 kg)

## 2013-05-06 ENCOUNTER — Encounter: Payer: Self-pay | Admitting: Internal Medicine

## 2013-05-06 ENCOUNTER — Other Ambulatory Visit (INDEPENDENT_AMBULATORY_CARE_PROVIDER_SITE_OTHER): Payer: Federal, State, Local not specified - PPO

## 2013-05-06 ENCOUNTER — Ambulatory Visit (INDEPENDENT_AMBULATORY_CARE_PROVIDER_SITE_OTHER): Payer: Federal, State, Local not specified - PPO | Admitting: Internal Medicine

## 2013-05-06 VITALS — BP 128/90 | HR 72 | Temp 98.3°F | Resp 16 | Wt 227.0 lb

## 2013-05-06 DIAGNOSIS — K610 Anal abscess: Secondary | ICD-10-CM

## 2013-05-06 DIAGNOSIS — M545 Low back pain, unspecified: Secondary | ICD-10-CM | POA: Insufficient documentation

## 2013-05-06 DIAGNOSIS — K612 Anorectal abscess: Secondary | ICD-10-CM

## 2013-05-06 LAB — URINALYSIS
Ketones, ur: NEGATIVE
Leukocytes, UA: NEGATIVE
Nitrite: NEGATIVE
Specific Gravity, Urine: 1.025 (ref 1.000–1.030)
Urobilinogen, UA: 0.2 (ref 0.0–1.0)
pH: 6 (ref 5.0–8.0)

## 2013-05-06 MED ORDER — KETOROLAC TROMETHAMINE 10 MG PO TABS: 10.0000 mg | ORAL_TABLET | Freq: Four times a day (QID) | ORAL | Status: DC | PRN

## 2013-05-06 MED ORDER — KETOROLAC TROMETHAMINE 60 MG/2ML IM SOLN
60.0000 mg | Freq: Once | INTRAMUSCULAR | Status: AC
  Administered 2013-05-06: 60 mg via INTRAMUSCULAR

## 2013-05-06 MED ORDER — HYDROCODONE-ACETAMINOPHEN 7.5-325 MG PO TABS: 1.0000 | ORAL_TABLET | Freq: Four times a day (QID) | ORAL | Status: DC | PRN

## 2013-05-06 NOTE — Assessment & Plan Note (Signed)
Ketorolac prn Norco prn UA

## 2013-05-06 NOTE — Progress Notes (Signed)
  Subjective:    Back Pain This is a new problem. The current episode started today. The pain is present in the lumbar spine. The pain does not radiate. The pain is at a severity of 5/10. The pain is severe. Pertinent negatives include no abdominal pain, dysuria or pelvic pain. Risk factors: kidney stone. She has tried analgesics and NSAIDs for the symptoms. The treatment provided mild relief.   C/o abscess x 1 month   Review of Systems  HENT: Negative for trouble swallowing and neck pain.   Gastrointestinal: Negative for nausea and abdominal pain.  Genitourinary: Negative for dysuria, frequency and pelvic pain.  Musculoskeletal: Positive for back pain.  Skin: Negative for rash.  Psychiatric/Behavioral: Negative for confusion and sleep disturbance. The patient is not nervous/anxious.    Wt Readings from Last 3 Encounters:  05/06/13 227 lb (102.967 kg)  05/01/13 224 lb (101.606 kg)  04/11/10 247 lb 8 oz (112.265 kg)   BP Readings from Last 3 Encounters:  05/06/13 128/90  05/01/13 110/80  04/11/10 130/74       Objective:   Physical Exam  Constitutional: No distress.  Obese   HENT:  Mouth/Throat: No oropharyngeal exudate.  Neck: No JVD present.  Pulmonary/Chest: She has no wheezes.  Musculoskeletal: She exhibits no tenderness.  Neurological: Coordination normal.  Skin: No rash noted.  Psychiatric: Thought content normal.          Assessment & Plan:

## 2013-05-10 ENCOUNTER — Encounter: Payer: Self-pay | Admitting: Internal Medicine

## 2013-05-10 NOTE — Assessment & Plan Note (Signed)
Resolving

## 2013-06-12 ENCOUNTER — Ambulatory Visit (INDEPENDENT_AMBULATORY_CARE_PROVIDER_SITE_OTHER): Payer: Federal, State, Local not specified - PPO | Admitting: Internal Medicine

## 2013-06-12 ENCOUNTER — Encounter: Payer: Self-pay | Admitting: Internal Medicine

## 2013-06-12 VITALS — BP 128/82 | HR 54 | Temp 98.1°F | Wt 225.0 lb

## 2013-06-12 DIAGNOSIS — Z860100 Personal history of colon polyps, unspecified: Secondary | ICD-10-CM | POA: Insufficient documentation

## 2013-06-12 DIAGNOSIS — K610 Anal abscess: Secondary | ICD-10-CM

## 2013-06-12 DIAGNOSIS — M545 Low back pain, unspecified: Secondary | ICD-10-CM | POA: Insufficient documentation

## 2013-06-12 DIAGNOSIS — K612 Anorectal abscess: Secondary | ICD-10-CM

## 2013-06-12 DIAGNOSIS — K5900 Constipation, unspecified: Secondary | ICD-10-CM

## 2013-06-12 DIAGNOSIS — Z1211 Encounter for screening for malignant neoplasm of colon: Secondary | ICD-10-CM

## 2013-06-12 DIAGNOSIS — K649 Unspecified hemorrhoids: Secondary | ICD-10-CM

## 2013-06-12 MED ORDER — POLYETHYLENE GLYCOL 3350 17 GM/SCOOP PO POWD: 17.0000 g | Freq: Two times a day (BID) | ORAL | Status: DC | PRN

## 2013-06-12 MED ORDER — POLYETHYLENE GLYCOL 3350 17 GM/SCOOP PO POWD: 17.0000 g | Freq: Every day | ORAL | Status: DC | PRN

## 2013-06-12 MED ORDER — LINACLOTIDE 145 MCG PO CAPS: 145.0000 ug | ORAL_CAPSULE | Freq: Every day | ORAL | Status: DC | PRN

## 2013-06-12 NOTE — Assessment & Plan Note (Signed)
Due colonoscopy.  

## 2013-06-12 NOTE — Progress Notes (Signed)
   Subjective:    Back Pain This is a new (she relates it to being constipated) problem. The current episode started today. The pain is present in the lumbar spine. The pain does not radiate. The pain is at a severity of 5/10. The pain is severe. Associated symptoms include a fever. Pertinent negatives include no abdominal pain, dysuria or pelvic pain. Risk factors: kidney stone. She has tried analgesics and NSAIDs for the symptoms. The treatment provided mild relief.   C/o constipation   Review of Systems  Constitutional: Positive for fever and chills.  HENT: Negative for trouble swallowing and neck pain.   Gastrointestinal: Positive for constipation. Negative for nausea, abdominal pain, diarrhea and anal bleeding.  Genitourinary: Negative for dysuria, frequency and pelvic pain.  Musculoskeletal: Positive for back pain.  Skin: Negative for rash.  Psychiatric/Behavioral: Negative for confusion and sleep disturbance. The patient is not nervous/anxious.    Wt Readings from Last 3 Encounters:  06/12/13 225 lb (102.059 kg)  05/06/13 227 lb (102.967 kg)  05/01/13 224 lb (101.606 kg)   BP Readings from Last 3 Encounters:  06/12/13 128/82  05/06/13 128/90  05/01/13 110/80       Objective:   Physical Exam  Constitutional: No distress.  Obese   HENT:  Mouth/Throat: No oropharyngeal exudate.  Neck: No JVD present.  Pulmonary/Chest: She has no wheezes. She has no rales.  Abdominal: She exhibits no mass. There is no tenderness. There is no guarding.  Musculoskeletal: She exhibits no tenderness.  LS - NT  Neurological: Coordination normal.  Skin: No rash noted.  Psychiatric: Thought content normal.    Lab Results  Component Value Date   WBC 6.4 05/01/2013   HGB 14.8 05/01/2013   HCT 43.8 05/01/2013   PLT 221.0 05/01/2013   GLUCOSE 95 05/01/2013   CHOL 247* 05/01/2013   TRIG 150.0* 05/01/2013   HDL 43.60 05/01/2013   LDLDIRECT 181.4 05/01/2013   ALT 22 05/01/2013   AST 23 05/01/2013   NA 139  05/01/2013   K 4.4 05/01/2013   CL 108 05/01/2013   CREATININE 0.7 05/01/2013   BUN 12 05/01/2013   CO2 26 05/01/2013   TSH 1.69 05/01/2013   HGBA1C 6.0 02/06/2008         Assessment & Plan:

## 2013-06-12 NOTE — Assessment & Plan Note (Signed)
Colonoscopy

## 2013-06-12 NOTE — Assessment & Plan Note (Signed)
Linzess or Miralax prn Colonosc - screening

## 2013-06-12 NOTE — Assessment & Plan Note (Signed)
Will watch Labs reviewed UA - declined

## 2013-06-12 NOTE — Assessment & Plan Note (Signed)
Resolved

## 2013-08-10 ENCOUNTER — Encounter: Payer: Self-pay | Admitting: Internal Medicine

## 2013-08-21 ENCOUNTER — Ambulatory Visit (INDEPENDENT_AMBULATORY_CARE_PROVIDER_SITE_OTHER): Payer: Federal, State, Local not specified - PPO | Admitting: Internal Medicine

## 2013-08-21 ENCOUNTER — Encounter: Payer: Self-pay | Admitting: Internal Medicine

## 2013-08-21 VITALS — BP 100/70 | HR 60 | Ht 65.0 in | Wt 228.4 lb

## 2013-08-21 DIAGNOSIS — K610 Anal abscess: Secondary | ICD-10-CM

## 2013-08-21 DIAGNOSIS — R159 Full incontinence of feces: Secondary | ICD-10-CM

## 2013-08-21 DIAGNOSIS — K648 Other hemorrhoids: Secondary | ICD-10-CM

## 2013-08-21 DIAGNOSIS — R32 Unspecified urinary incontinence: Secondary | ICD-10-CM

## 2013-08-21 DIAGNOSIS — K612 Anorectal abscess: Secondary | ICD-10-CM

## 2013-08-21 DIAGNOSIS — Z1211 Encounter for screening for malignant neoplasm of colon: Secondary | ICD-10-CM

## 2013-08-21 MED ORDER — NA SULFATE-K SULFATE-MG SULF 17.5-3.13-1.6 GM/177ML PO SOLN: ORAL | Status: DC

## 2013-08-21 NOTE — Assessment & Plan Note (Signed)
This appears to be resolved overall except for a small residual nodular that is not quite healed. There is no purulent discharge. We'll monitor.

## 2013-08-21 NOTE — Progress Notes (Addendum)
Subjective:  Referred by: Tresa Garter, MD   Patient ID: Latasha Hicks, female    DOB: 06-03-1961, 52 y.o.   MRN: [id number]  HPI Patient is a pleasant middle-aged white woman known to me from prior colonoscopy. She is here today with complaints of fecal incontinence. She has known stress urinary incontinence, wears a pad for that and has noted brown discharge from the rectum now on the past. She's had some recent constipation and started fiber pills. This has helped. She has hemorrhoids she believes, she is not describing bleeding but they protrude in the cleansing difficult sometimes. She's had stress urinary incontinence soon after the birth of her son years ago, she had an urgent episiotomy at that time and he was a large baby in fetal distress. The leakage of stool did not start until this past summer. Earlier this summer she had a perianal abscess, on the left gluteal area, treated with antibiotics. It is not draining purulent material anymore but she still has some drainage from at that is serous or slightly bloody. She can still feel that there. She denies abdominal pain nausea vomiting or fevers. She has also been on Linzess intermittently for constipation. Allergies  Allergen Reactions  . Clarithromycin     REACTION: gi upset   Outpatient Prescriptions Prior to Visit  Medication Sig Dispense Refill  .      . HYDROcodone-acetaminophen (NORCO) 7.5-325 MG per tablet Take 1 tablet by mouth 4 (four) times daily as needed for pain.  60 tablet  0  . ketorolac (TORADOL) 10 MG tablet Take 1 tablet (10 mg total) by mouth every 6 (six) hours as needed for pain.  20 tablet  0  . Linaclotide (LINZESS) 145 MCG CAPS Take 1 capsule (145 mcg total) by mouth daily as needed.  30 capsule  3  . mupirocin ointment (BACTROBAN) 2 % Use bid  30 g  0  . polyethylene glycol powder (GLYCOLAX/MIRALAX) powder Take 17 g by mouth daily as needed.  500 g  1   No facility-administered medications prior  to visit.   Past Medical History  Diagnosis Date  . Osteoarthritis   . Hemorrhoids   . Allergic rhinitis   . Hydronephrosis   . Obstructive sleep apnea   . Vitamin D deficiency   . Personal history of colonic polyps     Question accuracy, patient report only.  . Kidney stones   . Perianal abscess 05/01/2013    6/14 7 o'clock   . VENOUS INSUFFICIENCY, CHRONIC 07/05/2007    Annotation: LE Qualifier: Diagnosis of  By: Dance CMA (AAMA), Kim     Past Surgical History  Procedure Laterality Date  . Colonoscopy w/ polypectomy    . Hiatal hernia repair    . Laparoscopic cholecystectomy    . Laparoscopic gastric banding     History   Social History  . Marital Status: Married    Spouse Name: N/A    Number of Children: N/A  . Years of Education: N/A   Social History Main Topics  . Smoking status: Never Smoker   . Smokeless tobacco: Never Used  . Alcohol Use: No  . Drug Use: No  . Sexual Activity: Yes   Other Topics Concern  . 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 Diabetes Mother  Review of Systems Positive for those things mentioned in the history of present illness and joint pains, arthritis. All other review of systems negative.    Objective:   Physical Exam General:  Well-developed, well-nourished and in no acute distress Eyes:  anicteric. ENT:   Mouth and posterior pharynx free of lesions.  Neck:   supple w/o thyromegaly or mass.  Lungs: Clear to auscultation bilaterally. Heart:  S1S2, no rubs, murmurs, gallops. Abdomen:  obese, soft, non-tender, no hepatosplenomegaly, hernia, or mass and BS+. Lap band port to right of midline Rectal: Female staff present - left buttock at 5 o clock small red/violet papule with firmness, not deep, slight serous drainage with pressure and nontender + anal wink, + anterior tag, there is fecal material in  anoderm Slightly decreased resting tone and decreased squeeze - appropriate relaxation and descent and abd ctr  Anoscopy - inflamed edematous internal hemorrhoids all positions grade 1  Lymph:  no cervical or supraclavicular adenopathy. Extremities:   no edema Skin   no rash. Neuro:  A&O x 3.  Psych:  appropriate mood and  Affect.   Data Reviewed: Recent PCP notes previous colonoscopy     Assessment & Plan:  Urinary and fecal incontinence  Special screening for malignant neoplasms, colon - Plan: Na Sulfate-K Sulfate-Mg Sulf (SUPREP BOWEL PREP) SOLN, Ambulatory referral to Gastroenterology  Internal hemorrhoids

## 2013-08-21 NOTE — Assessment & Plan Note (Signed)
Seen on anoscopy today, I'm not sure how symptomatic they are. They could be contributing to some of his rectal discharge versus lax sphincter.

## 2013-08-21 NOTE — Patient Instructions (Addendum)
You have been scheduled for a colonoscopy with propofol. Please follow written instructions given to you at your visit today.  Please pick up your prep kit at the pharmacy within the next 1-3 days. If you use inhalers (even only as needed), please bring them with you on the day of your procedure. Your physician has requested that you go to www.startemmi.com and enter the access code given to you at your visit today. This web site gives a general overview about your procedure. However, you should still follow specific instructions given to you by our office regarding your preparation for the procedure.  Don't over do your fiber intake.  Please purchase Calmoseptine ointment , this is over the counter.  Use as directed.  Please read the handout we have given you on this today.  I appreciate the opportunity to care for you.

## 2013-08-21 NOTE — Assessment & Plan Note (Addendum)
She has decreased resting and voluntary squeeze the anal sphincter on rectal exam today. 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 Await colonoscopy. She is to reduce fiber supplementation at this point to try to avoid looser stools but maintain enough to promote regular defecation without straining. I think she would be a good candidate for urologic referral pending colonoscopy evaluation. She says she knows about and uses Kegel  Exercises. I have recommended Calmoseptine ointment as barrier protection in the meantime.

## 2013-08-24 ENCOUNTER — Encounter: Payer: Self-pay | Admitting: Internal Medicine

## 2013-09-03 ENCOUNTER — Inpatient Hospital Stay (HOSPITAL_COMMUNITY)
Admission: AD | Admit: 2013-09-03 | Discharge: 2013-09-05 | DRG: 453 | Disposition: A | Discharge status: Home / Self Care | Payer: Federal, State, Local not specified - PPO | Source: Ambulatory Visit | Attending: Gastroenterology | Admitting: Gastroenterology

## 2013-09-03 ENCOUNTER — Ambulatory Visit: Payer: Federal, State, Local not specified - PPO | Admitting: Internal Medicine

## 2013-09-03 ENCOUNTER — Telehealth: Payer: Self-pay | Admitting: Gastroenterology

## 2013-09-03 ENCOUNTER — Encounter: Payer: Self-pay | Admitting: Internal Medicine

## 2013-09-03 ENCOUNTER — Encounter (HOSPITAL_COMMUNITY): Payer: Self-pay | Admitting: Gastroenterology

## 2013-09-03 VITALS — BP 117/70 | HR 47 | Temp 98.9°F | Resp 18 | Ht 65.0 in | Wt 228.0 lb

## 2013-09-03 DIAGNOSIS — D126 Benign neoplasm of colon, unspecified: Secondary | ICD-10-CM

## 2013-09-03 DIAGNOSIS — Y849 Medical procedure, unspecified as the cause of abnormal reaction of the patient, or of later complication, without mention of misadventure at the time of the procedure: Secondary | ICD-10-CM | POA: Diagnosis present

## 2013-09-03 DIAGNOSIS — K633 Ulcer of intestine: Secondary | ICD-10-CM

## 2013-09-03 DIAGNOSIS — K648 Other hemorrhoids: Secondary | ICD-10-CM

## 2013-09-03 DIAGNOSIS — K5730 Diverticulosis of large intestine without perforation or abscess without bleeding: Secondary | ICD-10-CM | POA: Diagnosis present

## 2013-09-03 DIAGNOSIS — G4733 Obstructive sleep apnea (adult) (pediatric): Secondary | ICD-10-CM | POA: Diagnosis present

## 2013-09-03 DIAGNOSIS — M1990 Unspecified osteoarthritis, unspecified site: Secondary | ICD-10-CM | POA: Diagnosis present

## 2013-09-03 DIAGNOSIS — K922 Gastrointestinal hemorrhage, unspecified: Secondary | ICD-10-CM

## 2013-09-03 DIAGNOSIS — I872 Venous insufficiency (chronic) (peripheral): Secondary | ICD-10-CM | POA: Diagnosis present

## 2013-09-03 DIAGNOSIS — IMO0002 Reserved for concepts with insufficient information to code with codable children: Principal | ICD-10-CM | POA: Diagnosis present

## 2013-09-03 DIAGNOSIS — Z1211 Encounter for screening for malignant neoplasm of colon: Secondary | ICD-10-CM

## 2013-09-03 DIAGNOSIS — D62 Acute posthemorrhagic anemia: Secondary | ICD-10-CM | POA: Diagnosis present

## 2013-09-03 LAB — CBC
MCHC: 33.9 g/dL (ref 30.0–36.0)
MCV: 90.7 fL (ref 78.0–100.0)
RDW: 13.2 % (ref 11.5–15.5)

## 2013-09-03 LAB — PROTIME-INR: INR: 1.09 (ref 0.00–1.49)

## 2013-09-03 MED ORDER — ONDANSETRON HCL 4 MG/2ML IJ SOLN
4.0000 mg | Freq: Four times a day (QID) | INTRAMUSCULAR | Status: DC | PRN
  Administered 2013-09-04: 4 mg via INTRAVENOUS
  Filled 2013-09-03: qty 2

## 2013-09-03 MED ORDER — SODIUM CHLORIDE 0.9 % IV SOLN
INTRAVENOUS | Status: DC
  Administered 2013-09-04: 01:00:00 via INTRAVENOUS

## 2013-09-03 MED ORDER — PEG-KCL-NACL-NASULF-NA ASC-C 100 G PO SOLR
1.0000 | Freq: Once | ORAL | Status: AC
  Administered 2013-09-04: 200 g via ORAL
  Filled 2013-09-03: qty 1

## 2013-09-03 MED ORDER — KCL IN DEXTROSE-NACL 20-5-0.45 MEQ/L-%-% IV SOLN
INTRAVENOUS | Status: DC
  Filled 2013-09-03: qty 1000

## 2013-09-03 MED ORDER — SODIUM CHLORIDE 0.9 % IV SOLN: INTRAVENOUS | Status: DC

## 2013-09-03 MED ORDER — SODIUM CHLORIDE 0.9 % IV SOLN: 500.0000 mL | INTRAVENOUS | Status: DC

## 2013-09-03 NOTE — Progress Notes (Signed)
Dr. Leone Payor in to speak with the pt and her husband.  I made Dr. Leone Payor aware of abdominal cramping.  He assesed the pt.  Her abdomin is still soft and he also encouraged her to pass the gas..  Pt doing a good job of passing flatus.  Pt rolled to her right side.  maw

## 2013-09-03 NOTE — Progress Notes (Signed)
Pt passed a good amount of gas.  Her abdomin remains soft.  Pt states, "I am feeling better".  Waiting for Dr. Leone Payor to see pt before discharge. maw

## 2013-09-03 NOTE — Progress Notes (Signed)
Pt c/o abdominal cramping when entered the recovery room.  Her abdomin is soft and I incouraged her to bear down and push the gas out of her rectum.  maw

## 2013-09-03 NOTE — H&P (Signed)
[facility] Primary Care Physician:  Sonda Primes, MD Primary Gastroenterologist:  Dr. Leone Payor   CC: GI bleeding  HPI:  Latasha Hicks is a 52 y.o. female who underwent colonoscopy at [facility] with Dr. Leone Payor today. A 1.5cm polyp was removed from cecum with snare/cautery in piecemeal fashion. She has felt well in the afternoon until starting pass red rectal bleeding.  She ultimately had 4 episodes of rectal bleeding (dark red, red, some clots) and then she called the answering service. Her husband is a paramedic.  SHe has no abd pain, no nausea, no chest pain, no SOB or orthostasis. REally feels well otherwise.  ROS:  Full ROS was otherwise neg unless as noted above.   Past Medical History  Diagnosis Date  . Osteoarthritis   . Hemorrhoids   . Allergic rhinitis   . Hydronephrosis   . Obstructive sleep apnea   . Vitamin D deficiency   . Personal history of colonic polyps     Question accuracy, patient report only.  . Kidney stones   . Perianal abscess 05/01/2013    6/14 7 o'clock   . VENOUS INSUFFICIENCY, CHRONIC 07/05/2007    Annotation: LE Qualifier: Diagnosis of  By: Dance CMA (AAMA), Kim      Past Surgical History  Procedure Laterality Date  . Colonoscopy w/ polypectomy    . Hiatal hernia repair    . Laparoscopic cholecystectomy    . Laparoscopic gastric banding    . Vaginal hysterectomy      Prior to Admission medications   Medication Sig Start Date End Date Taking? Authorizing Provider  ibuprofen (ADVIL,MOTRIN) 200 MG tablet Take 200 mg by mouth every 6 (six) hours as needed for pain.    Historical Provider, MD    No current facility-administered medications for this encounter.    Allergies as of 09/03/2013 - Review Complete 09/03/2013  Allergen Reaction Noted  . Clarithromycin  08/13/2008    Family History  Problem Relation Age of Onset  . Diabetes Mother     History   Social History  . Marital Status: Married    Spouse Name: N/A   Number of Children: N/A  . Years of Education: N/A   Occupational History  . Not on file.   Social History Main Topics  . Smoking status: Never Smoker   . Smokeless tobacco: Never Used  . Alcohol Use: 1.8 oz/week    3 Glasses of wine per week  . Drug Use: No  . Sexual Activity: Yes   Other Topics Concern  . 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 Complete review of systems was performed and was otherwise normal.   Physical Exam: BP 120/60  Pulse 62  Temp(Src) 99 F (37.2 C) (Oral)  Ht 5\' 5"  (1.651 m)  Wt 224 lb 13.9 oz (102 kg)  BMI 37.42 kg/m2  SpO2 99%   Constitutional: generally well-appearing Psychiatric: alert and oriented x3 Eyes: extraocular movements intact Mouth: oral pharynx moist, no lesions Neck: supple no lymphadenopathy Cardiovascular: heart regular rate and rhythm Lungs: clear to auscultation bilaterally Abdomen: soft, nontender, nondistended, no obvious ascites, no peritoneal signs, normal bowel sounds Extremities: no lower extremity edema bilaterally Skin: no lesions on visible extremities    Impression/Plan: 52 y.o. female  With post  polypectomy bleeding  Stat labs (cbc, bmet).  IV fluids.  Type and screen for now.  Will monitor h/h serially.  She is hemodynamically stable.  I am admitting her for the GI bleeding, will prep with split dose prep.  Planning on repeat colonoscopy tomorrow AM.     Rachael Fee  09/03/2013, 9:56 PM [facility] Pager [phone]

## 2013-09-03 NOTE — Op Note (Signed)
[facility] [address] [place], [address]   COLONOSCOPY PROCEDURE REPORT  PATIENT: Latasha, Hicks  MR#: [id number] BIRTHDATE: Nov 02, 1961 , 51  yrs. old GENDER: Female ENDOSCOPIST: Iva Boop, MD, [facility] REFERRED XB:JYNW Buckner Malta, M.D. PROCEDURE DATE:  09/03/2013 PROCEDURE:   Colonoscopy with snare polypectomy First Screening Colonoscopy - Avg.  risk and is 50 yrs.  old or older - No.  Prior Negative Screening - Now for repeat screening. N/A  History of Adenoma - Now for follow-up colonoscopy & has been > or = to 3 yrs.  N/A  Polyps Removed Today? Yes. ASA CLASS:   Class II INDICATIONS:average risk screening and first colonoscopy. MEDICATIONS: propofol (Diprivan) 200mg  IV, MAC sedation, administered by CRNA, and These medications were titrated to patient response per physician's verbal order  DESCRIPTION OF PROCEDURE:   After the risks benefits and alternatives of the procedure were thoroughly explained, informed consent was obtained.  A digital rectal exam revealed several skin tags and a small nodule without drainage on left buttock.   The [id number]  endoscope was introduced through the anus and advanced to the terminal ileum which was intubated for a short distance. No adverse events experienced.   The quality of the prep was Suprep good  The instrument was then slowly withdrawn as the colon was fully examined.  COLON FINDINGS: A sessile polyp measuring 15 mm in size was found at the cecum.  A polypectomy was performed piecemeal fashion  using snare cautery.  The resection was complete and the polyp tissue was completely retrieved.   Moderate diverticulosis was noted in the sigmoid colon.   The mucosa appeared normal in the terminal ileum. The colon mucosa was otherwise normal.   A right colon retroflexion was performed.  Retroflexed views revealed internal hemorrhoids. The time to cecum=2 minutes 10 seconds.  Withdrawal time=13  minutes 41 seconds.  The scope was withdrawn and the procedure completed. COMPLICATIONS: There were no complications.  ENDOSCOPIC IMPRESSION: 1.   Sessile polyp measuring 15 mm in size was found at the cecum; polypectomy was performed using snare cautery 2.   Moderate diverticulosis was noted in the sigmoid colon 3.   Normal mucosa in the terminal ileum 4.   The colon mucosa was otherwise normal - excellent prep  RECOMMENDATIONS: 1.  Timing of repeat colonoscopy will be determined by pathology findings. 2.  Hold aspirin, aspirin products, and anti-inflammatory medication for 2 weeks. 3.  Schedule urology appointment with Dr.  Sherron Monday re: urinary incontinence and pelvic floor weakness 4.  Schedule follow-up with me late November/early December  eSigned:  Iva Boop, MD, [facility] 09/03/2013 1:42 PM cc: The Patient and Linda Hedges.  Plotnikov, MD

## 2013-09-03 NOTE — Progress Notes (Signed)
Lidocaine-40mg IV prior to Propofol InductionPropofol given over incremental dosages 

## 2013-09-03 NOTE — Patient Instructions (Addendum)
I found and removed one polyp from the colon. It was moderately large but appears benign.  You also have diverticulosis and hemorrhoids.  The nodule near the anus appears about the same - I do not see any infection there.  I think you should see a urologist about the urinary leakage and their evaluation may be useful for the leakage from the rectum also. My office will make an appointment for you and let you know.  I will let you know pathology results and when to have another routine colonoscopy by mail. You should see me in the office in 6-8 weeks. We will arrange that also.  I appreciate the opportunity to care for you. Iva Boop, MD, [facility]     Handouts were given to your care partner on polyps, diverticulosis and hemorrhoids. Hold aspirin, aspirin products, and anti-inflammatory medications for 2 week.  You may resume your other current medications today. Please call if any questions or concerns.    YOU HAD AN ENDOSCOPIC PROCEDURE TODAY AT THE [facility]: Refer to the procedure report that was given to you for any specific questions about what was found during the examination.  If the procedure report does not answer your questions, please call your gastroenterologist to clarify.  If you requested that your care partner not be given the details of your procedure findings, then the procedure report has been included in a sealed envelope for you to review at your convenience later.  YOU SHOULD EXPECT: Some feelings of bloating in the abdomen. Passage of more gas than usual.  Walking can help get rid of the air that was put into your GI tract during the procedure and reduce the bloating. If you had a lower endoscopy (such as a colonoscopy or flexible sigmoidoscopy) you may notice spotting of blood in your stool or on the toilet paper. If you underwent a bowel prep for your procedure, then you may not have a normal bowel movement for a few days.  DIET: Your first meal  following the procedure should be a light meal and then it is ok to progress to your normal diet.  A half-sandwich or bowl of soup is an example of a good first meal.  Heavy or fried foods are harder to digest and may make you feel nauseous or bloated.  Likewise meals heavy in dairy and vegetables can cause extra gas to form and this can also increase the bloating.  Drink plenty of fluids but you should avoid alcoholic beverages for 24 hours.  ACTIVITY: Your care partner should take you home directly after the procedure.  You should plan to take it easy, moving slowly for the rest of the day.  You can resume normal activity the day after the procedure however you should NOT DRIVE or use heavy machinery for 24 hours (because of the sedation medicines used during the test).    SYMPTOMS TO REPORT IMMEDIATELY: A gastroenterologist can be reached at any hour.  During normal business hours, 8:30 AM to 5:00 PM Monday through Friday, call [phone].  After hours and on weekends, please call the GI answering service at [phone] who will take a message and have the physician on call contact you.   Following lower endoscopy (colonoscopy or flexible sigmoidoscopy):  Excessive amounts of blood in the stool  Significant tenderness or worsening of abdominal pains  Swelling of the abdomen that is new, acute  Fever of 100F or higher   FOLLOW UP: If any  biopsies were taken you will be contacted by phone or by letter within the next 1-3 weeks.  Call your gastroenterologist if you have not heard about the biopsies in 3 weeks.  Our staff will call the home number listed on your records the next business day following your procedure to check on you and address any questions or concerns that you may have at that time regarding the information given to you following your procedure. This is a courtesy call and so if there is no answer at the home number and we have not heard from you through the emergency  physician on call, we will assume that you have returned to your regular daily activities without incident.  SIGNATURES/CONFIDENTIALITY: You and/or your care partner have signed paperwork which will be entered into your electronic medical record.  These signatures attest to the fact that that the information above on your After Visit Summary has been reviewed and is understood.  Full responsibility of the confidentiality of this discharge information lies with you and/or your care-partner.

## 2013-09-03 NOTE — Progress Notes (Signed)
Called to room to assist during endoscopic procedure.  Patient ID and intended procedure confirmed with present staff. Received instructions for my participation in the procedure from the performing physician.  

## 2013-09-03 NOTE — Telephone Encounter (Signed)
She had colonoscopy today at [facility], polyp removed piecemeal, cautery; has had 4 bloody BMs this afternoon/evening.  O/w feels well.  Her husband is paramedic, he says bleeding is pretty impressive ("clots and dark red blood").  She is going to [facility], through ER to be directly admitted by me (working on this already). Will plan to prep tonight and decide about repeating colonoscopy pending prep outcome, clincal course.

## 2013-09-04 ENCOUNTER — Encounter (HOSPITAL_COMMUNITY)
Admission: AD | Disposition: A | Discharge status: Home / Self Care | Payer: Self-pay | Source: Ambulatory Visit | Attending: Gastroenterology

## 2013-09-04 ENCOUNTER — Telehealth: Payer: Self-pay | Admitting: *Deleted

## 2013-09-04 ENCOUNTER — Encounter (HOSPITAL_COMMUNITY): Payer: Self-pay | Admitting: *Deleted

## 2013-09-04 DIAGNOSIS — D126 Benign neoplasm of colon, unspecified: Secondary | ICD-10-CM

## 2013-09-04 DIAGNOSIS — K633 Ulcer of intestine: Secondary | ICD-10-CM

## 2013-09-04 HISTORY — PX: COLONOSCOPY: SHX5424

## 2013-09-04 LAB — TYPE AND SCREEN: ABO/RH(D): O NEG

## 2013-09-04 LAB — BASIC METABOLIC PANEL
BUN: 12 mg/dL (ref 6–23)
CO2: 19 mEq/L (ref 19–32)
Calcium: 8.1 mg/dL — ABNORMAL LOW (ref 8.4–10.5)
Calcium: 7.7 mg/dL — ABNORMAL LOW (ref 8.4–10.5)
Creatinine, Ser: 0.76 mg/dL (ref 0.50–1.10)
Creatinine, Ser: 0.66 mg/dL (ref 0.50–1.10)
GFR calc Af Amer: >90 mL/min (ref >90)
GFR calc non Af Amer: >90 mL/min (ref >90)
GFR calc non Af Amer: >90 mL/min (ref >90)
Glucose, Bld: 116 mg/dL — ABNORMAL HIGH (ref 70–99)
Sodium: 141 mEq/L (ref 135–145)

## 2013-09-04 LAB — CBC
MCHC: 34.8 g/dL (ref 30.0–36.0)
MCV: 91.2 fL (ref 78.0–100.0)
Platelets: 230 10*3/uL (ref 150–400)
RBC: 3.31 MIL/uL — ABNORMAL LOW (ref 3.87–5.11)
RDW: 13.2 % (ref 11.5–15.5)
WBC: 8.7 10*3/uL (ref 4.0–10.5)

## 2013-09-04 SURGERY — COLONOSCOPY
Anesthesia: Moderate Sedation

## 2013-09-04 MED ORDER — FENTANYL CITRATE 0.05 MG/ML IJ SOLN
INTRAMUSCULAR | Status: AC
  Filled 2013-09-04: qty 2

## 2013-09-04 MED ORDER — MIDAZOLAM HCL 5 MG/ML IJ SOLN
INTRAMUSCULAR | Status: AC
  Filled 2013-09-04: qty 2

## 2013-09-04 MED ORDER — DIPHENHYDRAMINE HCL 50 MG/ML IJ SOLN
INTRAMUSCULAR | Status: AC
  Filled 2013-09-04: qty 1

## 2013-09-04 MED ORDER — DIPHENHYDRAMINE HCL 50 MG/ML IJ SOLN
INTRAMUSCULAR | Status: DC | PRN
  Administered 2013-09-04: 25 mg via INTRAVENOUS

## 2013-09-04 MED ORDER — MIDAZOLAM HCL 5 MG/ML IJ SOLN
INTRAMUSCULAR | Status: AC
  Filled 2013-09-04: qty 1

## 2013-09-04 MED ORDER — ACETAMINOPHEN 325 MG PO TABS
650.0000 mg | ORAL_TABLET | Freq: Four times a day (QID) | ORAL | Status: DC | PRN
  Administered 2013-09-04 – 2013-09-05 (×2): 650 mg via ORAL
  Filled 2013-09-04 (×2): qty 2

## 2013-09-04 MED ORDER — SODIUM CHLORIDE 0.9 % IV SOLN
Freq: Once | INTRAVENOUS | Status: AC
  Administered 2013-09-04: via INTRAVENOUS

## 2013-09-04 MED ORDER — MIDAZOLAM HCL 5 MG/5ML IJ SOLN
INTRAMUSCULAR | Status: DC | PRN
  Administered 2013-09-04 (×4): 2.5 mg via INTRAVENOUS

## 2013-09-04 MED ORDER — FENTANYL CITRATE 0.05 MG/ML IJ SOLN
INTRAMUSCULAR | Status: DC | PRN
  Administered 2013-09-04 (×4): 25 ug via INTRAVENOUS

## 2013-09-04 NOTE — Progress Notes (Signed)
See colonoscopy report.  Visible vessels at site of polypectomy were clipped.  Patient had stopped bleeding spontaneously.  There was no further bleeding with the procedure.  Plan 1.  clear liquid diet 2.  observe for 24 hours

## 2013-09-04 NOTE — Telephone Encounter (Signed)
  Follow up Call-  Call back number 09/03/2013  Post procedure Call Back phone  # [phone]  Permission to leave phone message Yes     Patient questions:  Patient answered the phone, and she is in the hospital.  She stated that she began to bleed, and the doctor had her admitted.   The plan is to put clips on today.

## 2013-09-04 NOTE — Progress Notes (Signed)
Utilization review completed. Manasi Dishon, RN, BSN. 

## 2013-09-04 NOTE — Progress Notes (Signed)
I called patient and reviewed complication with her.  Explained that CT scan or xray did not seem to be needed and that most likely colonoscopy with clipping today would take care of the bleeding. Explained that process.  Iva Boop, MD, [facility] [phone] (pager) 09/04/2013 7:32 AM

## 2013-09-04 NOTE — Progress Notes (Signed)
Pt accidentally pulled out IV. Catheter intact. Md notified, said it's okay not to restart one.

## 2013-09-04 NOTE — Progress Notes (Signed)
Pt given 2nd half of powder solution 0645. Prior, pt had thin, bloody stool. Noted to be less blood and lighter red.

## 2013-09-04 NOTE — Op Note (Signed)
[facility] [address] [place], [address]   COLONOSCOPY PROCEDURE REPORT  PATIENT: Latasha Hicks, Latasha Hicks  MR#: [id number] BIRTHDATE: [birth year] , 51  yrs. old GENDER: Female ENDOSCOPIST: Louis Meckel, MD REFERRED XB:JYNW Sena Slate, M.D, [facility] PROCEDURE DATE:  09/04/2013 PROCEDURE:   Colonoscopy with control of bleeding ASA CLASS:   Class II INDICATIONS:hematochezia several hours post polypectomy of large, sessile cecal polyp MEDICATIONS: MAC sedation, administered by CRNA, Versed 10 mg IV, Fentanyl 100 mcg IV, and Benadryl 25 mg IV  DESCRIPTION OF PROCEDURE:   After the risks benefits and alternatives of the procedure were thoroughly explained, informed consent was obtained.  A digital rectal exam revealed no abnormalities of the rectum.   The Pentax Adult Colon [id number] endoscope was introduced through the anus and advanced to the cecum, which was identified by both the appendix and ileocecal valve. No adverse events experienced.   The quality of the prep was excellent, using MoviPrep  The instrument was then slowly withdrawn as the colon was fully examined.      COLON FINDINGS: In the cecum, corresponding to the polypectomy site in the cecum, there was a large ulcer.  3 distinct vessels were seen.  There was no fresh or old blood. 5 endoscopic clips were placed on and around the individual vessels. No bleeding was noted.   Mild diverticulosis was noted in the sigmoid colon.   The colon mucosa was otherwise normal. Retroflexed views revealed no abnormalities.      .  Withdrawal time=13 minutes 0 seconds.  The scope was withdrawn and the procedure completed. COMPLICATIONS: There were no complications.  ENDOSCOPIC IMPRESSION: 1.   bleeding from polypectomy site, status post placement of endoscopic clips  RECOMMENDATIONS: 1.  clear liquids 2.  observation for another 24 hours  eSigned:  Louis Meckel, MD 09/04/2013 1:04 PM   cc:  Linda Hedges.  Plotnikov, MD and Zelphia Cairo MD

## 2013-09-04 NOTE — Progress Notes (Signed)
~  2305: Attempt to insert IV in pt, pt complain of pain at insertion site, IV removed. Pt states "I don't feel well". Noted HR drop to 40-30bpms. Pt begins to appear pale/gray. Encourage pt to talk, placed cuff on arm, pt put in trendelenburg and ask questions to stimulate. Pt begins to return color and interactive with nurse. Pt c/o of large bloody BM. EKG obtained, blood work drawn. Dr. Christella Hartigan called notified of events and event in progress, this nurse interventions, and pt's response. Informed to given NS bolus, resume fluids at ordered rate, and call with H/H results. Event ~10-46mintues.   0030:MD notified of H/H per order.

## 2013-09-04 NOTE — Interval H&P Note (Signed)
History and Physical Interval Note:  09/04/2013 12:09 PM  Latasha Hicks  has presented today for surgery, with the diagnosis of gi bleeding  The various methods of treatment have been discussed with the patient and family. After consideration of risks, benefits and other options for treatment, the patient has consented to  Procedure(s): COLONOSCOPY (N/A) as a surgical intervention .  The patient's history has been reviewed, patient examined, no change in status, stable for surgery.  I have reviewed the patient's chart and labs.  Questions were answered to the patient's satisfaction.    The recent H&P (dated *09/03/13**) was reviewed, the patient was examined and there is no change in the patients condition since that H&P was completed.   [name]  09/04/2013, 12:09 PM    [name]

## 2013-09-05 LAB — CBC
HCT: 27.5 % — ABNORMAL LOW (ref 36.0–46.0)
Hemoglobin: 9.5 g/dL — ABNORMAL LOW (ref 12.0–15.0)
MCH: 31.4 pg (ref 26.0–34.0)
MCV: 90.8 fL (ref 78.0–100.0)
Platelets: 191 10*3/uL (ref 150–400)
RBC: 3.03 MIL/uL — ABNORMAL LOW (ref 3.87–5.11)
WBC: 4.8 10*3/uL (ref 4.0–10.5)

## 2013-09-05 NOTE — Discharge Summary (Signed)
[facility] Discharge Summary  Name: Latasha Hicks MRN: [id number] DOB: Jun 04, 1961 52 y.o. PCP:  Latasha Primes, MD  Date of Admission: 09/03/2013  9:55 PM Date of Discharge: 09/05/2013 Primary Gastroenterologist: Latasha Head, MD Discharging Physician: Latasha Heaps, MD  Discharge Diagnosis: 1. Post-polypectomy bleed, resolved. 2. Anemia of acute blood loss.   Consultations: none   GI Procedures:  Colonoscopy 09/04/13 ENDOSCOPIC IMPRESSION:  bleeding from polypectomy site, status post placement of  endoscopic clips   History/Physical Exam:  See Admission H&P  Admission HPI:  Latasha Hicks is a 52 y.o. female who underwent colonoscopy at [facility] with Dr. Leone Hicks today. A 1.5cm polyp was removed from cecum with snare/cautery in piecemeal fashion. She has felt well in the afternoon until starting pass red rectal bleeding. She ultimately had 4 episodes of rectal bleeding (dark red, red, some clots) and then she called the answering service. Her husband is a paramedic. She has no abd pain, no nausea, no chest pain, no SOB or orthostasis. Really feels well otherwise.  Hicks Course by problem list: 1.  Post-polypectomy bleed. Patient admitted for hematochezia. Upon admission patient was hemodynamically stable. Admission hgb was 11.6.  Patient was prepped for repeat colonoscopy. The following morning her hgb was 10.5. She underwent colonoscopy with endoclip placement to visible vessels at polypectomy site. Early the next morning patient passed a small amount of blood. Her hgb was at 9.5.  Patient felt fine. She was discharged around noon after not having had any further bleeding. No NSAIDs for two weeks. Low fiber diet for next 10 days.   2. Anemia of acute blood loss. No transfusion required. Hgb upon discharge was 9.5. Overall, hgb fell approximately 4gms from baseline.   Discharge Vitals:  BP 92/56  Pulse 60  Temp(Src) 98.2 F (36.8 C) (Oral)  Resp 18  Ht 5\' 5"   (1.651 m)  Wt 237 lb 10.5 oz (107.8 kg)  BMI 39.55 kg/m2  SpO2 96%  Physical Exam General: pleasant white female in NAD Cardiac: Regular rate and rhythm Pulmonary: Lungs clear bilaterally Abdominal: Soft, nontender, nondistended Neuro: Alert and oriented.  Discharge Labs:  Results for orders placed during the Hicks encounter of 09/03/13 (from the past 24 hour(s))  CBC     Status: Abnormal   Collection Time    09/05/13  7:03 AM      Result Value Range   WBC 4.8  4.0 - 10.5 K/uL   RBC 3.03 (*) 3.87 - 5.11 MIL/uL   Hemoglobin 9.5 (*) 12.0 - 15.0 g/dL   HCT 78.2 (*) 95.6 - 21.3 %   MCV 90.8  78.0 - 100.0 fL   MCH 31.4  26.0 - 34.0 pg   MCHC 34.5  30.0 - 36.0 g/dL   RDW 08.6  57.8 - 46.9 %   Platelets 191  150 - 400 K/uL    Disposition and follow-up:   Latasha [facility] was discharged from [facility] in stable condition.    Follow-up Appointments: Our office will call patient with follow up appointment.  Discharge Medications:   Medication List    STOP taking these medications       ibuprofen 200 MG tablet  Commonly known as:  ADVIL,MOTRIN      TAKE these medications       acetaminophen 650 MG CR tablet  Commonly known as:  TYLENOL  Take 650 mg by mouth every 8 (eight) hours as needed for pain (pain).  Signed: Willette Hicks 09/05/2013, 10:29 AM   I have personally taken an interval history, reviewed the chart, and examined the patient.  I agree with the extender's note, impression and recommendations.  Latasha Hicks. Latasha Dice, MD, [facility] [phone]

## 2013-09-07 ENCOUNTER — Encounter (HOSPITAL_COMMUNITY): Payer: Self-pay | Admitting: Gastroenterology

## 2013-09-07 ENCOUNTER — Telehealth: Payer: Self-pay | Admitting: Internal Medicine

## 2013-09-07 NOTE — Telephone Encounter (Signed)
Note placed at front desk.  She will come pick it up

## 2013-09-11 ENCOUNTER — Encounter: Payer: Self-pay | Admitting: Internal Medicine

## 2013-09-11 NOTE — Telephone Encounter (Signed)
Error

## 2013-09-11 NOTE — Progress Notes (Signed)
Quick Note:  Please call patient from office and let her know polyp was benign  She had a post-polypectomy bleed  1) Tell her I hope she is feeling better and no more bleeding 2) Needs a CBC today or Monday and I will call the results dx is acute blood loss anemia 3) She may have an REV if she wants but think we can handle over phone if ok 4) Repeat colonoscopy needed 3 years 2017    ______

## 2013-09-15 ENCOUNTER — Other Ambulatory Visit: Payer: Self-pay

## 2013-09-15 DIAGNOSIS — IMO0002 Reserved for concepts with insufficient information to code with codable children: Secondary | ICD-10-CM

## 2013-09-17 ENCOUNTER — Other Ambulatory Visit (INDEPENDENT_AMBULATORY_CARE_PROVIDER_SITE_OTHER): Payer: Federal, State, Local not specified - PPO

## 2013-09-17 DIAGNOSIS — IMO0002 Reserved for concepts with insufficient information to code with codable children: Secondary | ICD-10-CM

## 2013-09-17 LAB — CBC
HCT: 32.5 % — ABNORMAL LOW (ref 36.0–46.0)
Hemoglobin: 10.9 g/dL — ABNORMAL LOW (ref 12.0–15.0)
MCHC: 33.5 g/dL (ref 30.0–36.0)
Platelets: 267 10*3/uL (ref 150.0–400.0)
RDW: 14.6 % (ref 11.5–14.6)

## 2013-09-17 NOTE — Progress Notes (Signed)
Quick Note:  I called results to the patient  1) she will start ferrous sulfate 325 mg qd 2) She is to have CBC in about 1 month - please place order and send her a letter to remind her to come late Nov 3) Once I hear from urology about incontinence evaluation we will see where to go re: fecal incontinence   ______

## 2013-09-18 ENCOUNTER — Other Ambulatory Visit: Payer: Self-pay

## 2013-09-18 DIAGNOSIS — D509 Iron deficiency anemia, unspecified: Secondary | ICD-10-CM

## 2013-09-18 MED ORDER — IRON 325 (65 FE) MG PO TABS: 325.0000 mg | ORAL_TABLET | Freq: Every day | ORAL | Status: DC

## 2013-10-01 ENCOUNTER — Other Ambulatory Visit: Payer: Self-pay

## 2013-10-14 ENCOUNTER — Other Ambulatory Visit: Payer: Self-pay | Admitting: Orthopaedic Surgery

## 2013-10-14 DIAGNOSIS — M5020 Other cervical disc displacement, unspecified cervical region: Secondary | ICD-10-CM

## 2013-10-20 ENCOUNTER — Ambulatory Visit
Admission: RE | Admit: 2013-10-20 | Discharge: 2013-10-20 | Disposition: A | Discharge status: Home / Self Care | Payer: Federal, State, Local not specified - PPO | Source: Ambulatory Visit | Attending: Orthopaedic Surgery | Admitting: Orthopaedic Surgery

## 2013-10-20 DIAGNOSIS — M5020 Other cervical disc displacement, unspecified cervical region: Secondary | ICD-10-CM

## 2014-07-26 ENCOUNTER — Ambulatory Visit (INDEPENDENT_AMBULATORY_CARE_PROVIDER_SITE_OTHER): Payer: Federal, State, Local not specified - PPO | Admitting: Internal Medicine

## 2014-07-26 ENCOUNTER — Ambulatory Visit (HOSPITAL_COMMUNITY): Payer: Federal, State, Local not specified - PPO | Attending: Cardiovascular Disease | Admitting: Cardiology

## 2014-07-26 ENCOUNTER — Encounter: Payer: Self-pay | Admitting: Internal Medicine

## 2014-07-26 VITALS — BP 118/78 | HR 63 | Temp 98.4°F | Resp 18 | Ht 66.0 in | Wt 226.0 lb

## 2014-07-26 DIAGNOSIS — E669 Obesity, unspecified: Secondary | ICD-10-CM | POA: Diagnosis not present

## 2014-07-26 DIAGNOSIS — M79605 Pain in left leg: Secondary | ICD-10-CM

## 2014-07-26 DIAGNOSIS — M7989 Other specified soft tissue disorders: Secondary | ICD-10-CM | POA: Insufficient documentation

## 2014-07-26 DIAGNOSIS — M79609 Pain in unspecified limb: Secondary | ICD-10-CM

## 2014-07-26 NOTE — Assessment & Plan Note (Signed)
8/15 subacute US vasc LLE D dimer, labs ASA

## 2014-07-26 NOTE — Progress Notes (Signed)
Pre visit review using our clinic review tool, if applicable. No additional management support is needed unless otherwise documented below in the visit note. 

## 2014-07-26 NOTE — Progress Notes (Signed)
   Subjective:    HPI C/o L leg swelling since June 2015 It got worse after her trip to [place] in August - returned last Wed am - bad swelling.  Dr Sherrian Divers injected R knee inJune and L knee in Aug ([facility])  Son had a clot in the arm from injury   Review of Systems  Constitutional: Positive for chills.  HENT: Negative for trouble swallowing.   Gastrointestinal: Positive for constipation. Negative for nausea, diarrhea and anal bleeding.  Genitourinary: Negative for frequency.  Musculoskeletal: Negative for neck pain.  Skin: Negative for rash.  Psychiatric/Behavioral: Negative for confusion and sleep disturbance. The patient is not nervous/anxious.    Wt Readings from Last 3 Encounters:  07/26/14 226 lb 0.6 oz (102.531 kg)  09/04/13 237 lb 10.5 oz (107.8 kg)  09/04/13 237 lb 10.5 oz (107.8 kg)   BP Readings from Last 3 Encounters:  07/26/14 118/78  09/05/13 92/56  09/05/13 92/56       Objective:   Physical Exam  Constitutional: No distress.  Obese   HENT:  Mouth/Throat: No oropharyngeal exudate.  Neck: No JVD present.  Pulmonary/Chest: She has no wheezes. She has no rales.  Abdominal: She exhibits no mass. There is no tenderness. There is no guarding.  Musculoskeletal: She exhibits no tenderness.  LS - NT  Neurological: Coordination normal.  Skin: No rash noted.  Psychiatric: Thought content normal.  LLE w/trace edema, tight, sensitive  Lab Results  Component Value Date   WBC 8.7 09/17/2013   HGB 10.9* 09/17/2013   HCT 32.5* 09/17/2013   PLT 267.0 09/17/2013   GLUCOSE 116* 09/04/2013   CHOL 247* 05/01/2013   TRIG 150.0* 05/01/2013   HDL 43.60 05/01/2013   LDLDIRECT 181.4 05/01/2013   ALT 22 05/01/2013   AST 23 05/01/2013   NA 141 09/04/2013   K 4.0 09/04/2013   CL 113* 09/04/2013   CREATININE 0.66 09/04/2013   BUN 11 09/04/2013   CO2 19 09/04/2013   TSH 1.69 05/01/2013   INR 1.09 09/03/2013   HGBA1C 6.0 02/06/2008         Assessment & Plan:

## 2014-07-26 NOTE — Patient Instructions (Signed)
Elevate left leg

## 2014-07-26 NOTE — Progress Notes (Signed)
Left lower venous duplex performed  

## 2014-07-27 ENCOUNTER — Other Ambulatory Visit (INDEPENDENT_AMBULATORY_CARE_PROVIDER_SITE_OTHER): Payer: Federal, State, Local not specified - PPO

## 2014-07-27 ENCOUNTER — Telehealth: Payer: Self-pay | Admitting: *Deleted

## 2014-07-27 ENCOUNTER — Encounter (HOSPITAL_COMMUNITY): Payer: Federal, State, Local not specified - PPO

## 2014-07-27 DIAGNOSIS — M7989 Other specified soft tissue disorders: Secondary | ICD-10-CM

## 2014-07-27 LAB — CBC WITH DIFFERENTIAL/PLATELET
Basophils Absolute: 0 10*3/uL (ref 0.0–0.1)
Basophils Relative: 0.4 % (ref 0.0–3.0)
EOS PCT: 1 % (ref 0.0–5.0)
Eosinophils Absolute: 0.1 10*3/uL (ref 0.0–0.7)
HEMATOCRIT: 42.7 % (ref 36.0–46.0)
HEMOGLOBIN: 14.4 g/dL (ref 12.0–15.0)
LYMPHS ABS: 1.5 10*3/uL (ref 0.7–4.0)
Lymphocytes Relative: 20.7 % (ref 12.0–46.0)
MCHC: 33.8 g/dL (ref 30.0–36.0)
MCV: 93 fl (ref 78.0–100.0)
MONOS PCT: 8.2 % (ref 3.0–12.0)
Monocytes Absolute: 0.6 10*3/uL (ref 0.1–1.0)
Neutro Abs: 4.9 10*3/uL (ref 1.4–7.7)
Neutrophils Relative %: 69.7 % (ref 43.0–77.0)
PLATELETS: 254 10*3/uL (ref 150.0–400.0)
RBC: 4.59 Mil/uL (ref 3.87–5.11)
RDW: 14 % (ref 11.5–15.5)
WBC: 7.1 10*3/uL (ref 4.0–10.5)

## 2014-07-27 LAB — BASIC METABOLIC PANEL
BUN: 12 mg/dL (ref 6–23)
CALCIUM: 8.9 mg/dL (ref 8.4–10.5)
CHLORIDE: 105 meq/L (ref 96–112)
CO2: 28 mEq/L (ref 19–32)
Creatinine, Ser: 0.7 mg/dL (ref 0.4–1.2)
GFR: 89.09 mL/min (ref >60.00)
GLUCOSE: 91 mg/dL (ref 70–99)
Potassium: 4.1 mEq/L (ref 3.5–5.1)
SODIUM: 137 meq/L (ref 135–145)

## 2014-07-27 LAB — PROTIME-INR
INR: 1 ratio (ref 0.8–1.0)
Prothrombin Time: 11.2 s (ref 9.6–13.1)

## 2014-07-27 NOTE — Telephone Encounter (Signed)
Left msg on triage stating had her ultrasound done yesterday was told know DVT, but she is still having increase pain & swelling in her knee. Requesting md advisement...[facility]

## 2014-07-27 NOTE — Telephone Encounter (Signed)
No DVT on [place] - good news. Take Aleve 1 po bid pc F/u w/ortho Th

## 2014-07-27 NOTE — Telephone Encounter (Signed)
Notified pt with md response.../lmb 

## 2014-07-28 LAB — D-DIMER, QUANTITATIVE (NOT AT ARMC): D DIMER QUANT: 0.54 ug{FEU}/mL — AB (ref 0.00–0.48)

## 2014-08-02 ENCOUNTER — Encounter: Payer: Self-pay | Admitting: Internal Medicine

## 2014-09-10 ENCOUNTER — Other Ambulatory Visit: Payer: Self-pay

## 2015-06-07 ENCOUNTER — Telehealth: Payer: Self-pay | Admitting: Internal Medicine

## 2015-06-07 DIAGNOSIS — G4733 Obstructive sleep apnea (adult) (pediatric): Secondary | ICD-10-CM

## 2015-06-07 DIAGNOSIS — E559 Vitamin D deficiency, unspecified: Secondary | ICD-10-CM

## 2015-06-07 NOTE — Telephone Encounter (Signed)
Patient is being seen Friday. She is requesting that we have her blood sugar checked by putting lab orders in.

## 2015-06-08 NOTE — Telephone Encounter (Signed)
CMET, Vit B12, Vit D, CBC, UA, TSH Thx

## 2015-06-08 NOTE — Telephone Encounter (Signed)
Please advise what labs do you want ordered? Last OV was 07/26/2014.

## 2015-06-09 ENCOUNTER — Other Ambulatory Visit (INDEPENDENT_AMBULATORY_CARE_PROVIDER_SITE_OTHER): Payer: Federal, State, Local not specified - PPO

## 2015-06-09 DIAGNOSIS — E559 Vitamin D deficiency, unspecified: Secondary | ICD-10-CM | POA: Diagnosis not present

## 2015-06-09 DIAGNOSIS — G4733 Obstructive sleep apnea (adult) (pediatric): Secondary | ICD-10-CM

## 2015-06-09 LAB — CBC WITH DIFFERENTIAL/PLATELET
Basophils Absolute: 0 10*3/uL (ref 0.0–0.1)
Basophils Relative: 0.4 % (ref 0.0–3.0)
Eosinophils Absolute: 0.1 10*3/uL (ref 0.0–0.7)
Eosinophils Relative: 1.4 % (ref 0.0–5.0)
HCT: 42.7 % (ref 36.0–46.0)
Hemoglobin: 14.4 g/dL (ref 12.0–15.0)
Lymphocytes Relative: 23.5 % (ref 12.0–46.0)
Lymphs Abs: 1.7 10*3/uL (ref 0.7–4.0)
MCHC: 33.8 g/dL (ref 30.0–36.0)
MCV: 91.8 fl (ref 78.0–100.0)
Monocytes Absolute: 0.6 10*3/uL (ref 0.1–1.0)
Monocytes Relative: 7.7 % (ref 3.0–12.0)
Neutro Abs: 4.8 10*3/uL (ref 1.4–7.7)
Neutrophils Relative %: 67 % (ref 43.0–77.0)
Platelets: 214 10*3/uL (ref 150.0–400.0)
RBC: 4.65 Mil/uL (ref 3.87–5.11)
RDW: 14 % (ref 11.5–15.5)
WBC: 7.2 10*3/uL (ref 4.0–10.5)

## 2015-06-09 LAB — COMPREHENSIVE METABOLIC PANEL
ALBUMIN: 3.5 g/dL (ref 3.5–5.2)
ALK PHOS: 43 U/L (ref 39–117)
ALT: 15 U/L (ref 0–35)
AST: 16 U/L (ref 0–37)
BUN: 17 mg/dL (ref 6–23)
CALCIUM: 9 mg/dL (ref 8.4–10.5)
CHLORIDE: 106 meq/L (ref 96–112)
CO2: 25 mEq/L (ref 19–32)
CREATININE: 0.74 mg/dL (ref 0.40–1.20)
GFR: 87.4 mL/min (ref >60.00)
GLUCOSE: 93 mg/dL (ref 70–99)
POTASSIUM: 4.3 meq/L (ref 3.5–5.1)
Sodium: 137 mEq/L (ref 135–145)
Total Bilirubin: 0.6 mg/dL (ref 0.2–1.2)
Total Protein: 6.3 g/dL (ref 6.0–8.3)

## 2015-06-09 LAB — URINALYSIS, ROUTINE W REFLEX MICROSCOPIC
Bilirubin Urine: NEGATIVE
Ketones, ur: NEGATIVE
Leukocytes, UA: NEGATIVE
Nitrite: NEGATIVE
Specific Gravity, Urine: >=1.03 — AB (ref 1.000–1.030)
Total Protein, Urine: NEGATIVE
Urine Glucose: NEGATIVE
Urobilinogen, UA: 0.2 (ref 0.0–1.0)
pH: 5.5 (ref 5.0–8.0)

## 2015-06-09 LAB — TSH: TSH: 1.99 u[IU]/mL (ref 0.35–4.50)

## 2015-06-09 LAB — VITAMIN B12: VITAMIN B 12: 210 pg/mL — AB (ref 211–911)

## 2015-06-09 LAB — VITAMIN D 25 HYDROXY (VIT D DEFICIENCY, FRACTURES): VITD: 13.14 ng/mL — AB (ref 30.00–100.00)

## 2015-06-09 NOTE — Telephone Encounter (Signed)
Labs ordered. Pt informed

## 2015-06-10 ENCOUNTER — Telehealth: Payer: Self-pay

## 2015-06-10 ENCOUNTER — Ambulatory Visit (INDEPENDENT_AMBULATORY_CARE_PROVIDER_SITE_OTHER): Payer: Federal, State, Local not specified - PPO | Admitting: Internal Medicine

## 2015-06-10 ENCOUNTER — Encounter: Payer: Self-pay | Admitting: Internal Medicine

## 2015-06-10 VITALS — BP 120/88 | HR 68 | Ht 66.0 in | Wt 229.0 lb

## 2015-06-10 DIAGNOSIS — E559 Vitamin D deficiency, unspecified: Secondary | ICD-10-CM | POA: Diagnosis not present

## 2015-06-10 DIAGNOSIS — Z1239 Encounter for other screening for malignant neoplasm of breast: Secondary | ICD-10-CM

## 2015-06-10 DIAGNOSIS — R202 Paresthesia of skin: Secondary | ICD-10-CM | POA: Diagnosis not present

## 2015-06-10 MED ORDER — ERGOCALCIFEROL 1.25 MG (50000 UT) PO CAPS: 50000.0000 [IU] | ORAL_CAPSULE | ORAL | Status: DC

## 2015-06-10 MED ORDER — CYANOCOBALAMIN 1000 MCG/ML IJ SOLN: 1000.0000 ug | Freq: Once | INTRAMUSCULAR | Status: DC

## 2015-06-10 MED ORDER — CYANOCOBALAMIN 1000 MCG/ML IJ SOLN
1000.0000 ug | Freq: Once | INTRAMUSCULAR | Status: AC
  Administered 2015-06-10: 1000 ug via SUBCUTANEOUS

## 2015-06-10 MED ORDER — B COMPLEX PO TABS: 1.0000 | ORAL_TABLET | Freq: Every day | ORAL | Status: AC

## 2015-06-10 MED ORDER — VITAMIN D3 50 MCG (2000 UT) PO CAPS: 2000.0000 [IU] | ORAL_CAPSULE | Freq: Every day | ORAL | Status: DC

## 2015-06-10 NOTE — Progress Notes (Signed)
Pre visit review using our clinic review tool, if applicable. No additional management support is needed unless otherwise documented below in the visit note. 

## 2015-06-10 NOTE — Progress Notes (Signed)
Subjective:  Patient ID: Latasha Hicks, female    DOB: [birth year]  Age: 54 y.o. MRN: [id number]  CC: Numbness   HPI Latasha Hicks presents for B toes numbness x4-6 weeks  Outpatient Prescriptions Prior to Visit  Medication Sig Dispense Refill  . Ferrous Sulfate (IRON) 325 (65 FE) MG TABS Take 325 mg by mouth daily. (Patient not taking: Reported on 06/10/2015) 30 each 0  . acetaminophen (TYLENOL) 650 MG CR tablet Take 650 mg by mouth every 8 (eight) hours as needed for pain (pain).     No facility-administered medications prior to visit.    ROS Review of Systems  Constitutional: Negative for chills, activity change, appetite change, fatigue and unexpected weight change.  HENT: Negative for congestion, mouth sores and sinus pressure.   Eyes: Negative for visual disturbance.  Respiratory: Negative for cough and chest tightness.   Gastrointestinal: Negative for nausea and abdominal pain.  Genitourinary: Negative for frequency, difficulty urinating and vaginal pain.  Musculoskeletal: Negative for back pain and gait problem.  Skin: Negative for pallor and rash.  Neurological: Positive for numbness. Negative for dizziness, tremors, weakness and headaches.  Psychiatric/Behavioral: Negative for confusion, sleep disturbance and dysphoric mood. The patient is not nervous/anxious.     Objective:  BP 120/88 mmHg  Pulse 68  Ht 5\' 6"  (1.676 m)  Wt 229 lb (103.874 kg)  BMI 36.98 kg/m2  SpO2 97%  BP Readings from Last 3 Encounters:  06/10/15 120/88  07/26/14 118/78  09/05/13 92/56    Wt Readings from Last 3 Encounters:  06/10/15 229 lb (103.874 kg)  07/26/14 226 lb 0.6 oz (102.531 kg)  09/04/13 237 lb 10.5 oz (107.8 kg)    Physical Exam  Constitutional: She appears well-developed. No distress.  Obese  HENT:  Head: Normocephalic.  Right Ear: External ear normal.  Left Ear: External ear normal.  Nose: Nose normal.  Mouth/Throat: Oropharynx is clear and moist.    Eyes: Conjunctivae are normal. Pupils are equal, round, and reactive to light. Right eye exhibits no discharge. Left eye exhibits no discharge.  Neck: Normal range of motion. Neck supple. No JVD present. No tracheal deviation present. No thyromegaly present.  Cardiovascular: Normal rate, regular rhythm and normal heart sounds.   Pulmonary/Chest: No stridor. No respiratory distress. She has no wheezes.  Abdominal: Soft. Bowel sounds are normal. She exhibits no distension and no mass. There is no tenderness. There is no rebound and no guarding.  Musculoskeletal: She exhibits no edema or tenderness.  Lymphadenopathy:    She has no cervical adenopathy.  Neurological: She displays normal reflexes. No cranial nerve deficit. She exhibits normal muscle tone. Coordination normal.  Skin: No rash noted. No erythema.  Psychiatric: She has a normal mood and affect. Her behavior is normal. Judgment and thought content normal.    Lab Results  Component Value Date   WBC 7.2 06/09/2015   HGB 14.4 06/09/2015   HCT 42.7 06/09/2015   PLT 214.0 06/09/2015   GLUCOSE 93 06/09/2015   CHOL 247* 05/01/2013   TRIG 150.0* 05/01/2013   HDL 43.60 05/01/2013   LDLDIRECT 181.4 05/01/2013   ALT 15 06/09/2015   AST 16 06/09/2015   NA 137 06/09/2015   K 4.3 06/09/2015   CL 106 06/09/2015   CREATININE 0.74 06/09/2015   BUN 17 06/09/2015   CO2 25 06/09/2015   TSH 1.99 06/09/2015   INR 1.0 07/27/2014   HGBA1C 6.0 02/06/2008    Mr Cervical Spine Wo Contrast  10/20/2013   CLINICAL DATA:  Neck pain and left arm pain for 3 months.  EXAM: MRI CERVICAL SPINE WITHOUT CONTRAST  TECHNIQUE: Multiplanar, multisequence MR imaging was performed. No intravenous contrast was administered.  COMPARISON:  None.  FINDINGS: There is straightening of the normal cervical lordosis. There is 2 mm of retrolisthesis of C4 on C5 and of C5 on C6. Vertebral marrow signal is within normal limits. Vertebral body heights are preserved.  Craniocervical junction is unremarkable. There is a small focus of T2 hyperintensity within the right aspect of the spinal cord at C6. The visualized paraspinal soft tissues are unremarkable.  C2-3:  Negative.  C3-4: Diminutive central disc protrusion and mild uncovertebral hypertrophy result in minimal left greater than right neural foraminal narrowing without spinal canal stenosis.  C4-5: There is a broad-based posterior disc protrusion eccentric to the left which results in mild spinal canal narrowing and mild left greater than right neural foraminal stenosis.  C5-6: Moderate to large broad-based posterior disc protrusion and uncovertebral hypertrophy result in moderate spinal canal stenosis with mild flattening of the ventral spinal cord and mild right and moderate to severe left neural foraminal stenosis.  C6-7: Mild broad-based posterior disc osteophyte complex and left-sided uncovertebral hypertrophy result in mild right and mild to moderate left neural foraminal stenosis and minimal spinal canal stenosis.  C7-T1:  Negative.  T1-2: Only imaged sagittally without evidence of disc herniation or stenosis.  T2-3: Only imaged sagittally. Small disc protrusion to the right of midline results in minimal right neural foraminal narrowing.  IMPRESSION: Multilevel degenerative disc disease from C4-C7, worst at C5-6 were there is moderate spinal stenosis and moderate to severe left neural foraminal stenosis. 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   Electronically Signed   By: Logan Bores   On: 10/20/2013 10:58    Assessment & Plan:   [name] was seen today for numbness.  Diagnoses and all orders for this visit:  Screening for breast cancer Orders: -     MM Digital Screening; Future  Paresthesia Orders: -     cyanocobalamin ((VITAMIN B-12)) injection 1,000 mcg; Inject 1 mL (1,000 mcg total) into the skin once.  Vitamin D deficiency Orders: -      Discontinue: cyanocobalamin ((VITAMIN B-12)) injection 1,000 mcg; Inject 1 mL (1,000 mcg total) into the muscle once.  Other orders -     b complex vitamins tablet; Take 1 tablet by mouth daily. -     ergocalciferol (VITAMIN D2) 50000 UNITS capsule; Take 1 capsule (50,000 Units total) by mouth once a week. -     Cholecalciferol (VITAMIN D3) 2000 UNITS capsule; Take 1 capsule (2,000 Units total) by mouth daily.   I have discontinued Ms. Mattes's acetaminophen. I am also having her start on b complex vitamins, ergocalciferol, and Vitamin D3. Additionally, I am having her maintain her Iron. We administered cyanocobalamin.  Meds ordered this encounter  Medications  . b complex vitamins tablet    Sig: Take 1 tablet by mouth daily.    Dispense:  100 tablet    Refill:  3  . ergocalciferol (VITAMIN D2) 50000 UNITS capsule    Sig: Take 1 capsule (50,000 Units total) by mouth once a week.    Dispense:  6 capsule    Refill:  0  . Cholecalciferol (VITAMIN D3) 2000 UNITS capsule    Sig: Take 1 capsule (2,000 Units total) by mouth daily.    Dispense:  100 capsule    Refill:  3  . DISCONTD: cyanocobalamin ((VITAMIN B-12)) injection 1,000 mcg    Sig:   . cyanocobalamin ((VITAMIN B-12)) injection 1,000 mcg    Sig:      Follow-up: Return for a follow-up visit.  Walker Kehr, MD

## 2015-06-10 NOTE — Telephone Encounter (Signed)
Spoke with pt about mammogram being due via OV 06/10/15. Pt agreed to have one set up and is aware someone will contact her regarding scheduling. The order will be put in

## 2015-06-10 NOTE — Assessment & Plan Note (Signed)
start Vit D prescription 50000 iu weekly (Rx emailed to your pharmacy) followed by over-the-counter Vit D 1000 iu daily.  

## 2015-07-15 ENCOUNTER — Telehealth: Payer: Self-pay | Admitting: *Deleted

## 2015-07-15 MED ORDER — ERGOCALCIFEROL 1.25 MG (50000 UT) PO CAPS: 50000.0000 [IU] | ORAL_CAPSULE | ORAL | Status: DC

## 2015-07-15 NOTE — Telephone Encounter (Signed)
Pt is requesting refill on her vitamin d^2. Verified pharmacy inform pt will send to [facility].../lmb

## 2015-07-16 ENCOUNTER — Other Ambulatory Visit: Payer: Self-pay | Admitting: Internal Medicine

## 2016-06-01 ENCOUNTER — Encounter: Payer: Self-pay | Admitting: Internal Medicine

## 2016-06-01 ENCOUNTER — Ambulatory Visit (INDEPENDENT_AMBULATORY_CARE_PROVIDER_SITE_OTHER): Payer: Federal, State, Local not specified - PPO | Admitting: Internal Medicine

## 2016-06-01 VITALS — BP 132/76 | HR 93 | Temp 98.6°F | Resp 14 | Ht 67.0 in | Wt 222.0 lb

## 2016-06-01 DIAGNOSIS — K1120 Sialoadenitis, unspecified: Secondary | ICD-10-CM

## 2016-06-01 MED ORDER — CEPHALEXIN 500 MG PO CAPS: 500.0000 mg | ORAL_CAPSULE | Freq: Three times a day (TID) | ORAL | Status: DC

## 2016-06-01 NOTE — Assessment & Plan Note (Signed)
Rx for keflex and most likely explanation. If not improved with treatment would order US neck for evaluation.

## 2016-06-01 NOTE — Progress Notes (Signed)
   Subjective:    Patient ID: Latasha Hicks, female    DOB: Sep 28, 1961, 55 y.o.   MRN: [id number]  HPI The patient is a 55 YO female coming in for swelling in her neck right greater than left. Going on for about 6 weeks. She denies nasal congestion or drainage. No fevers or chills. No dental pain or infection. No dry mouth or mouth drainage. Has not tried anything for it yet. She was concerned about cancer since it is unchanged in 6 weeks. Non-smoker.  Review of Systems  Constitutional: Negative for fever, chills, activity change, appetite change, fatigue and unexpected weight change.  HENT: Positive for facial swelling. Negative for congestion, dental problem, postnasal drip, rhinorrhea, sinus pressure, sore throat and trouble swallowing.   Eyes: Negative.   Respiratory: Negative for cough, chest tightness and shortness of breath.   Cardiovascular: Negative.   Gastrointestinal: Negative.   Musculoskeletal: Negative.       Objective:   Physical Exam  Constitutional: She appears well-developed and well-nourished.  Overweight  HENT:  Head: Normocephalic and atraumatic.  Right Ear: External ear normal.  Left Ear: External ear normal.  Dental cavity without abscess or infection, not dry appearing, oropharynx normal and ears normal. No sinus pressure.  Eyes: EOM are normal. Pupils are equal, round, and reactive to light.  Neck:  Shotty LAD, right greater than left, with tenderness to palpation on the right  Cardiovascular: Normal rate and regular rhythm.   Pulmonary/Chest: Effort normal and breath sounds normal. No respiratory distress. She has no wheezes. She has no rales.  Abdominal: Soft. She exhibits no distension. There is no tenderness. There is no rebound.   Filed Vitals:   06/01/16 1604  BP: 132/76  Pulse: 93  Temp: 98.6 F (37 C)  TempSrc: Oral  Resp: 14  Height: 5\' 7"  (1.702 m)  Weight: 222 lb (100.699 kg)  SpO2: 97%      Assessment & Plan:

## 2016-06-01 NOTE — Patient Instructions (Signed)
We have sent in keflex for the saliva gland. Take 1 pill 3 times per day for 1 week. We recommend to take it with food as it can give some people nausea.   If you are not feeling better in 1-2 weeks call us back and we can check an ultrasound of the neck.

## 2016-06-01 NOTE — Progress Notes (Signed)
Pre visit review using our clinic review tool, if applicable. No additional management support is needed unless otherwise documented below in the visit note. 

## 2016-06-04 ENCOUNTER — Other Ambulatory Visit: Payer: Self-pay | Admitting: Orthopaedic Surgery

## 2016-06-04 DIAGNOSIS — M25572 Pain in left ankle and joints of left foot: Secondary | ICD-10-CM

## 2016-06-12 ENCOUNTER — Ambulatory Visit
Admission: RE | Admit: 2016-06-12 | Discharge: 2016-06-12 | Disposition: A | Discharge status: Home / Self Care | Payer: Federal, State, Local not specified - PPO | Source: Ambulatory Visit | Attending: Orthopaedic Surgery | Admitting: Orthopaedic Surgery

## 2016-06-12 DIAGNOSIS — M25572 Pain in left ankle and joints of left foot: Secondary | ICD-10-CM

## 2016-06-25 ENCOUNTER — Encounter: Payer: Self-pay | Admitting: Internal Medicine

## 2016-06-25 ENCOUNTER — Ambulatory Visit (INDEPENDENT_AMBULATORY_CARE_PROVIDER_SITE_OTHER): Payer: Federal, State, Local not specified - PPO | Admitting: Internal Medicine

## 2016-06-25 DIAGNOSIS — K1120 Sialoadenitis, unspecified: Secondary | ICD-10-CM

## 2016-06-25 MED ORDER — DOXYCYCLINE HYCLATE 100 MG PO TABS: 100.0000 mg | ORAL_TABLET | Freq: Two times a day (BID) | ORAL | 0 refills | Status: DC

## 2016-06-25 NOTE — Assessment & Plan Note (Addendum)
7/17 R - refractory; worse Start Doxy Water w/lemon ENT ref

## 2016-06-25 NOTE — Progress Notes (Signed)
Pre visit review using our clinic review tool, if applicable. No additional management support is needed unless otherwise documented below in the visit note. 

## 2016-06-25 NOTE — Progress Notes (Signed)
Subjective:  Patient ID: Latasha [facility], female    DOB: 05/18/1961  Age: 55 y.o. MRN: [id number]  CC: Adenopathy (Right side of neck x 2 months. Recently completed Keflex with no improvement. )   HPI Latasha [facility] presents for a gland swelling under R jaw x 1 mo - treated w/Keflex a month ago - not better. Worse at night. Pt had a steroid shot to the ankle the other day...  Outpatient Medications Prior to Visit  Medication Sig Dispense Refill  . b complex vitamins tablet Take 1 tablet by mouth daily. 100 tablet 3  . Cholecalciferol (VITAMIN D3) 2000 UNITS capsule Take 1 capsule (2,000 Units total) by mouth daily. 100 capsule 3  . ergocalciferol (VITAMIN D2) 50000 UNITS capsule Take 1 capsule (50,000 Units total) by mouth once a week. 6 capsule 0  . Ferrous Sulfate (IRON) 325 (65 FE) MG TABS Take 325 mg by mouth daily. 30 each 0  . cephALEXin (KEFLEX) 500 MG capsule Take 1 capsule (500 mg total) by mouth 3 (three) times daily. 21 capsule 0   No facility-administered medications prior to visit.     ROS Review of Systems  Constitutional: Negative for activity change, appetite change, chills, fatigue and unexpected weight change.  HENT: Negative for congestion, mouth sores, sinus pressure, sore throat, trouble swallowing and voice change.   Eyes: Negative for visual disturbance.  Respiratory: Negative for cough and chest tightness.   Gastrointestinal: Negative for abdominal pain and nausea.  Genitourinary: Negative for difficulty urinating, frequency and vaginal pain.  Musculoskeletal: Negative for back pain and gait problem.  Skin: Negative for pallor and rash.  Neurological: Negative for dizziness, tremors, weakness, numbness and headaches.  Psychiatric/Behavioral: Negative for confusion and sleep disturbance.    Objective:  BP 130/80   Pulse (!) 50   Temp 98.4 F (36.9 C) (Oral)   Wt 219 lb (99.3 kg)   SpO2 98%   BMI 34.30 kg/m   BP Readings from Last 3  Encounters:  06/25/16 130/80  06/01/16 132/76  06/10/15 120/88    Wt Readings from Last 3 Encounters:  06/25/16 219 lb (99.3 kg)  06/01/16 222 lb (100.7 kg)  06/10/15 229 lb (103.9 kg)    Physical Exam  Constitutional: She appears well-developed. No distress.  HENT:  Head: Normocephalic.  Right Ear: External ear normal.  Left Ear: External ear normal.  Nose: Nose normal.  Mouth/Throat: Oropharynx is clear and moist.  Eyes: Conjunctivae are normal. Pupils are equal, round, and reactive to light. Right eye exhibits no discharge. Left eye exhibits no discharge.  Neck: Normal range of motion. Neck supple. No JVD present. No tracheal deviation present. No thyromegaly present.  Cardiovascular: Normal rate, regular rhythm and normal heart sounds.   Pulmonary/Chest: No stridor. No respiratory distress. She has no wheezes.  Abdominal: Soft. Bowel sounds are normal. She exhibits no distension and no mass. There is no tenderness. There is no rebound and no guarding.  Musculoskeletal: She exhibits no edema or tenderness.  Lymphadenopathy:    She has no cervical adenopathy.  Neurological: She displays normal reflexes. No cranial nerve deficit. She exhibits normal muscle tone. Coordination normal.  Skin: No rash noted. No erythema.  Psychiatric: She has a normal mood and affect. Her behavior is normal. Judgment and thought content normal.  L>R submandibular jaw is swollen a little; R is more tender  Lab Results  Component Value Date   WBC 7.2 06/09/2015   HGB 14.4 06/09/2015  HCT 42.7 06/09/2015   PLT 214.0 06/09/2015   GLUCOSE 93 06/09/2015   CHOL 247 (H) 05/01/2013   TRIG 150.0 (H) 05/01/2013   HDL 43.60 05/01/2013   LDLDIRECT 181.4 05/01/2013   ALT 15 06/09/2015   AST 16 06/09/2015   NA 137 06/09/2015   K 4.3 06/09/2015   CL 106 06/09/2015   CREATININE 0.74 06/09/2015   BUN 17 06/09/2015   CO2 25 06/09/2015   TSH 1.99 06/09/2015   INR 1.0 07/27/2014   HGBA1C 6.0 02/06/2008     Mr Ankle Left  Wo Contrast  Result Date: 06/12/2016 CLINICAL DATA:  Left heel pain for 7 months. No known injury. Initial encounter. EXAM: MRI OF THE LEFT ANKLE WITHOUT CONTRAST TECHNIQUE: Multiplanar, multisequence MR imaging of the ankle was performed. No intravenous contrast was administered. COMPARISON:  Plain films left ankle performed at [facility] 04/03/2016. FINDINGS: Subcutaneous edema is seen about the imaged lower leg. TENDONS Peroneal: Intact. A small amount of fluid in the tendon sheathes is noted. Posteromedial: Intact. A small amount of fluid is seen in the sheath of the tibialis posterior tendon. Anterior: Intact. Achilles: Intact and unremarkable in appearance. Plantar Fascia: Appears normal. LIGAMENTS Lateral: Intact. Medial: Intact. CARTILAGE Ankle Joint: Unremarkable. Subtalar Joints/Sinus Tarsi: Unremarkable. Bones: No fracture or worrisome marrow lesion. Small osteophytes about the talonavicular joint are identified. Small dorsal and plantar calcaneal spurs are seen. Other: None IMPRESSION: Intact and normal appearing Achilles tendon. Small amount of fluid in the sheathes of the tibialis posterior and peroneal tendons may be due to tenosynovitis or could be incidental. No tendon tear is identified. Subcutaneous edema about the ankle. Electronically Signed   By: Inge Rise M.D.   On: 06/12/2016 14:57    Assessment & Plan:   There are no diagnoses linked to this encounter. I have discontinued Latasha Hicks's cephALEXin. I am also having her maintain her Iron, b complex vitamins, Vitamin D3, and ergocalciferol.  No orders of the defined types were placed in this encounter.    Follow-up: No Follow-up on file.  Walker Kehr, MD

## 2016-06-25 NOTE — Patient Instructions (Addendum)
Salivary Gland Infection °A salivary gland infection is an infection in one or more of the glands that produce spit (saliva). You have six major salivary glands. Each gland has a duct that carries saliva into your mouth. Saliva keeps your mouth moist and breaks down the food that you eat. It also helps to prevent tooth decay. °Two salivary glands are located just in front of your ears (parotid). The ducts for these glands open up inside your cheeks, near your back teeth. You also have two glands under your tongue (sublingual) and two glands under your jaw (submandibular). The ducts for these glands open under your tongue. Any salivary gland can become infected. Most infections occur in the parotid glands or submandibular glands. °CAUSES °Salivary glands can be infected by viruses or bacteria. °· The mumps virus is the most common cause of viral salivary gland infections, though mumps is now rare in many areas because of vaccination. °¨ This infection causes swelling in both parotid glands. °¨ Viral infections are more common in children. °· The bacteria that cause salivary gland infections are usually the same bacteria that normally live in your mouth. °¨ A stone can form in a salivary gland and block the flow of saliva. As a result, saliva backs up into the salivary gland. Bacteria may then start to grow behind the blockage and cause infection. °¨ Bacterial infections usually cause pain and swelling on one side of the face. Submandibular gland swelling occurs under the jaw. Parotid swelling occurs in front of the ear. °¨ Bacterial infections are more common in adults. °RISK FACTORS °Children who do not get the MMR (measles, mumps, rubella) vaccine are more likely to get mumps, which can cause a viral salivary gland infection. °Risk factors for bacterial infections include: °· Poor dental care (oral hygiene). °· Smoking. °· Not drinking enough water. °· Having a disease that causes dry mouth and dry eyes (Mikulicz  syndrome or Sjogren syndrome). °SIGNS AND SYMPTOMS °The main sign of salivary gland infection is a swollen salivary gland. This type of inflammation is often called sialadenitis. You may have swelling in front of your ear, under your jaw, or under your tongue. Swelling may get worse when you eat and decrease after you eat. Other signs and symptoms include: °· Pain. °· Tenderness. °· Redness. °· Dry mouth. °· Bad taste in your mouth. °· Difficulty chewing and swallowing. °· Fever. °DIAGNOSIS °Your health care provider may suspect a salivary gland infection based on your signs and symptoms. He or she will also do a physical exam. The health care provider will look and feel inside your mouth to see whether a stone is blocking a salivary gland duct. You may need to see an ear, nose, and throat specialist (ENT or otolaryngologist) for diagnosis and treatment. You may also need to have diagnostic tests, such as: °· An X-ray to check for a stone. °· Other imaging studies to look for an abscess and to rule out other causes of swelling. These tests may include: °¨ Ultrasound. °¨ CT scan. °¨ MRI. °· Culture and sensitivity test. This involves collecting a sample of pus for testing in the lab to see what bacteria grow and what antibiotics they are sensitive to. The testing sample may be: °¨ Swabbed from a salivary gland duct. °¨ Withdrawn from a swollen gland with a needle (aspiration). °TREATMENT °Viral salivary gland infections usually clear up without treatment. Bacterial infections are usually treated with antibiotic medicine. Severe infections that cause difficulty with swallowing may   be treated with an IV antibiotic in the hospital. Other treatments may include:  Probing and widening the salivary duct to allow a stone to pass. In some cases, a thin, flexible scope (endoscope) may be inserted into the duct to find a stone and remove it.  Breaking up a stone using sound waves.  Draining an infected gland (abscess)  with a needle.  In some cases, you may need surgery so your health care provider can:  Remove a stone.  Drain pus from an abscess.  Remove a badly infected gland. HOME CARE INSTRUCTIONS  Take medicines only as directed by your health care provider.  If you were prescribed an antibiotic medicine, finish it all even if you start to feel better.  Follow these instructions every few hours:  Suck on a lemon candy to stimulate the flow of saliva.  Put a warm compress over the gland.  Gently massage the gland.  Drink enough fluid to keep your urine clear or pale yellow.  Rinse your mouth with a mixture of warm water and salt every few hours. To make this mixture, add a pinch of salt to 1 cup of warm water.  Practice good oral hygiene by brushing and flossing your teeth after meals and before you go to bed.  Do not use any tobacco products, including cigarettes, chewing tobacco, or electronic cigarettes. If you need help quitting, ask your health care provider. SEEK MEDICAL CARE IF:  You have pain and swelling in your face, jaw, or mouth after eating.  You have persistent swelling in any of these places:  In front of your ear.  Under your jaw.  Inside your mouth. SEEK IMMEDIATE MEDICAL CARE IF:   You have pain and swelling in your face, jaw, or mouth that are getting worse.  Your pain and swelling make it hard to swallow or breathe.   This information is not intended to replace advice given to you by your health care provider. Make sure you discuss any questions you have with your health care provider.   Document Released: 12/20/2004 Document Revised: 12/03/2014 Document Reviewed: 04/14/2014 Elsevier Interactive Patient Education 2016 [employer] Salivary Stone A salivary stone is a mineral deposit that builds up in the ducts that drain your salivary glands. Most salivary gland stones are made of calcium. When a stone forms, saliva can back up into the gland and cause  painful swelling. Your salivary glands are the glands that produce spit (saliva). You have six major salivary glands. Each gland has a duct that carries saliva into your mouth. Saliva keeps your mouth moist and breaks down the food that you eat. It also helps to prevent tooth decay. Two salivary glands are located just in front of your ears (parotid). The ducts for these glands open up inside your cheeks, near your back teeth. You also have two glands under your tongue (sublingual) and two glands under your jaw (submandibular). The ducts for these glands open under your tongue. A stone can form in any salivary gland. The most common place for a salivary stone to develop is in a submandibular salivary gland. CAUSES Any condition that reduces the flow of saliva may lead to stone formation. It is not known why some people form stones and others do not.  RISK FACTORS You may be more likely to develop a salivary stone if you:  Are female.  Do not drink enough water.  Smoke.  Have high blood pressure.  Have gout.  Have diabetes. SIGNS  AND SYMPTOMS The main sign of a salivary gland stone is sudden swelling of a salivary gland when eating. This usually happens under the jaw on one side. Other signs and symptoms include:  Swelling of the cheek or under the tongue when eating.  Pain in the swollen area.  Trouble chewing or swallowing.  Swelling that goes down after eating. DIAGNOSIS Your health care provider may diagnose a salivary gland stone based on your signs and symptoms. The health care provider will also do a physical exam. In many cases, a stone can be felt in a duct inside your mouth. You may need to see an ear, nose, and throat specialist (ENT or otolaryngologist) for diagnosis and treatment. You may also need to have diagnostic tests. These may include imaging studies to check for a stone, such as:  X-rays.  Ultrasound.  CT scan.  MRI. TREATMENT Home care may be enough to treat  a small stone that is not causing symptoms. Treatment of a stone that is large enough to cause symptoms may include:  Probing and widening the duct to allow the stone to pass.  Inserting a thin, flexible scope (endoscope) into the duct to locate and remove the stone.  Breaking up the stone with sound waves.  Removing the entire salivary gland. HOME CARE INSTRUCTIONS  Drink enough fluid to keep your urine clear or pale yellow.  Follow these instructions every few hours:  Suck on a lemon candy to stimulate the flow of saliva.  Put a hot compress over the gland.  Gently massage the gland.  Do not use any tobacco products, including cigarettes, chewing tobacco, or electronic cigarettes. If you need help quitting, ask your health care provider. SEEK MEDICAL CARE IF:  You have pain and swelling in your face, jaw, or mouth after eating.  You have persistent swelling in any of these places:  In front of your ear.  Under your jaw.  Inside your mouth. SEEK IMMEDIATE MEDICAL CARE IF:  You have pain and swelling in your face, jaw, or mouth that are getting worse.  Your pain and swelling make it hard to swallow or breathe.   This information is not intended to replace advice given to you by your health care provider. Make sure you discuss any questions you have with your health care provider.   Document Released: 12/20/2004 Document Revised: 12/03/2014 Document Reviewed: 04/14/2014 Elsevier Interactive Patient Education 2016 [place] [employer]

## 2016-07-05 ENCOUNTER — Encounter: Payer: Self-pay | Admitting: Internal Medicine

## 2016-07-05 DIAGNOSIS — M542 Cervicalgia: Secondary | ICD-10-CM | POA: Insufficient documentation

## 2016-07-05 HISTORY — DX: Cervicalgia: M54.2

## 2016-07-12 ENCOUNTER — Other Ambulatory Visit: Payer: Self-pay | Admitting: Physician Assistant

## 2016-07-12 DIAGNOSIS — M542 Cervicalgia: Secondary | ICD-10-CM

## 2016-07-19 ENCOUNTER — Other Ambulatory Visit: Payer: Self-pay | Admitting: Physician Assistant

## 2016-07-19 ENCOUNTER — Ambulatory Visit
Admission: RE | Admit: 2016-07-19 | Discharge: 2016-07-19 | Disposition: A | Discharge status: Home / Self Care | Payer: Federal, State, Local not specified - PPO | Source: Ambulatory Visit | Attending: Physician Assistant | Admitting: Physician Assistant

## 2016-07-19 DIAGNOSIS — M542 Cervicalgia: Secondary | ICD-10-CM

## 2016-07-24 ENCOUNTER — Other Ambulatory Visit: Payer: Self-pay | Admitting: Physician Assistant

## 2016-07-24 DIAGNOSIS — R070 Pain in throat: Principal | ICD-10-CM

## 2016-07-24 DIAGNOSIS — G8929 Other chronic pain: Secondary | ICD-10-CM

## 2016-08-03 ENCOUNTER — Ambulatory Visit
Admission: RE | Admit: 2016-08-03 | Discharge: 2016-08-03 | Disposition: A | Discharge status: Home / Self Care | Payer: Federal, State, Local not specified - PPO | Source: Ambulatory Visit | Attending: Physician Assistant | Admitting: Physician Assistant

## 2016-08-03 DIAGNOSIS — G8929 Other chronic pain: Secondary | ICD-10-CM

## 2016-08-03 DIAGNOSIS — R070 Pain in throat: Principal | ICD-10-CM

## 2016-08-03 MED ORDER — IOPAMIDOL (ISOVUE-300) INJECTION 61%
75.0000 mL | Freq: Once | INTRAVENOUS | Status: AC | PRN
  Administered 2016-08-03: 75 mL via INTRAVENOUS

## 2016-08-29 ENCOUNTER — Encounter (HOSPITAL_COMMUNITY): Payer: Self-pay

## 2016-09-05 ENCOUNTER — Encounter: Payer: Self-pay | Admitting: Internal Medicine

## 2016-09-26 ENCOUNTER — Ambulatory Visit: Payer: Federal, State, Local not specified - PPO | Admitting: Internal Medicine

## 2016-10-02 ENCOUNTER — Ambulatory Visit (INDEPENDENT_AMBULATORY_CARE_PROVIDER_SITE_OTHER): Payer: Federal, State, Local not specified - PPO | Admitting: Internal Medicine

## 2016-10-02 ENCOUNTER — Ambulatory Visit (INDEPENDENT_AMBULATORY_CARE_PROVIDER_SITE_OTHER)
Admission: RE | Admit: 2016-10-02 | Discharge: 2016-10-02 | Disposition: A | Discharge status: Home / Self Care | Payer: Federal, State, Local not specified - PPO | Source: Ambulatory Visit | Attending: Internal Medicine | Admitting: Internal Medicine

## 2016-10-02 ENCOUNTER — Encounter: Payer: Self-pay | Admitting: Internal Medicine

## 2016-10-02 ENCOUNTER — Other Ambulatory Visit (INDEPENDENT_AMBULATORY_CARE_PROVIDER_SITE_OTHER): Payer: Federal, State, Local not specified - PPO

## 2016-10-02 VITALS — BP 120/80 | HR 60 | Temp 98.5°F | Ht 65.0 in | Wt 226.0 lb

## 2016-10-02 DIAGNOSIS — E559 Vitamin D deficiency, unspecified: Secondary | ICD-10-CM

## 2016-10-02 DIAGNOSIS — Z780 Asymptomatic menopausal state: Secondary | ICD-10-CM

## 2016-10-02 DIAGNOSIS — Z0000 Encounter for general adult medical examination without abnormal findings: Secondary | ICD-10-CM | POA: Insufficient documentation

## 2016-10-02 DIAGNOSIS — Z9884 Bariatric surgery status: Secondary | ICD-10-CM

## 2016-10-02 DIAGNOSIS — Z860100 Personal history of colon polyps, unspecified: Secondary | ICD-10-CM

## 2016-10-02 DIAGNOSIS — K922 Gastrointestinal hemorrhage, unspecified: Secondary | ICD-10-CM | POA: Diagnosis not present

## 2016-10-02 LAB — CBC WITH DIFFERENTIAL/PLATELET
BASOS ABS: 0 10*3/uL (ref 0.0–0.1)
Basophils Relative: 0.2 % (ref 0.0–3.0)
EOS ABS: 0.1 10*3/uL (ref 0.0–0.7)
EOS PCT: 1.1 % (ref 0.0–5.0)
HCT: 43.3 % (ref 36.0–46.0)
HEMOGLOBIN: 14.8 g/dL (ref 12.0–15.0)
LYMPHS ABS: 1.5 10*3/uL (ref 0.7–4.0)
Lymphocytes Relative: 22.1 % (ref 12.0–46.0)
MCHC: 34.2 g/dL (ref 30.0–36.0)
MCV: 90.1 fl (ref 78.0–100.0)
MONO ABS: 0.5 10*3/uL (ref 0.1–1.0)
Monocytes Relative: 7.4 % (ref 3.0–12.0)
NEUTROS PCT: 69.2 % (ref 43.0–77.0)
Neutro Abs: 4.7 10*3/uL (ref 1.4–7.7)
Platelets: 282 10*3/uL (ref 150.0–400.0)
RBC: 4.81 Mil/uL (ref 3.87–5.11)
RDW: 13.5 % (ref 11.5–15.5)
WBC: 6.8 10*3/uL (ref 4.0–10.5)

## 2016-10-02 LAB — URINALYSIS
BILIRUBIN URINE: NEGATIVE
Hgb urine dipstick: NEGATIVE
KETONES UR: NEGATIVE
LEUKOCYTES UA: NEGATIVE
Nitrite: NEGATIVE
SPECIFIC GRAVITY, URINE: 1.015 (ref 1.000–1.030)
Total Protein, Urine: NEGATIVE
URINE GLUCOSE: NEGATIVE
UROBILINOGEN UA: 0.2 (ref 0.0–1.0)
pH: 5.5 (ref 5.0–8.0)

## 2016-10-02 LAB — HEPATIC FUNCTION PANEL
ALBUMIN: 3.9 g/dL (ref 3.5–5.2)
ALK PHOS: 60 U/L (ref 39–117)
ALT: 30 U/L (ref 0–35)
AST: 23 U/L (ref 0–37)
BILIRUBIN DIRECT: 0.1 mg/dL (ref 0.0–0.3)
TOTAL PROTEIN: 6.9 g/dL (ref 6.0–8.3)
Total Bilirubin: 0.7 mg/dL (ref 0.2–1.2)

## 2016-10-02 LAB — LIPID PANEL
CHOL/HDL RATIO: 5
Cholesterol: 240 mg/dL — ABNORMAL HIGH (ref 0–200)
HDL: 51.6 mg/dL (ref >39.00)
LDL Cholesterol: 157 mg/dL — ABNORMAL HIGH (ref 0–99)
NONHDL: 188.1
Triglycerides: 156 mg/dL — ABNORMAL HIGH (ref 0.0–149.0)
VLDL: 31.2 mg/dL (ref 0.0–40.0)

## 2016-10-02 LAB — BASIC METABOLIC PANEL
BUN: 13 mg/dL (ref 6–23)
CALCIUM: 9.4 mg/dL (ref 8.4–10.5)
CHLORIDE: 106 meq/L (ref 96–112)
CO2: 24 meq/L (ref 19–32)
Creatinine, Ser: 0.74 mg/dL (ref 0.40–1.20)
GFR: 86.96 mL/min (ref >60.00)
GLUCOSE: 89 mg/dL (ref 70–99)
POTASSIUM: 3.9 meq/L (ref 3.5–5.1)
SODIUM: 139 meq/L (ref 135–145)

## 2016-10-02 LAB — TSH: TSH: 1.91 u[IU]/mL (ref 0.35–4.50)

## 2016-10-02 LAB — VITAMIN B12: VITAMIN B 12: 631 pg/mL (ref 211–911)

## 2016-10-02 NOTE — Progress Notes (Signed)
Subjective:  Patient ID: Latasha Hicks, female    DOB: 07/10/1961  Age: 55 y.o. MRN: [id number]  CC: Annual Exam   HPI Latasha Hicks presents for a well exam.  Outpatient Medications Prior to Visit  Medication Sig Dispense Refill  . b complex vitamins tablet Take 1 tablet by mouth daily. 100 tablet 3  . ergocalciferol (VITAMIN D2) 50000 UNITS capsule Take 1 capsule (50,000 Units total) by mouth once a week. 6 capsule 0  . Cholecalciferol (VITAMIN D3) 2000 UNITS capsule Take 1 capsule (2,000 Units total) by mouth daily. (Patient not taking: Reported on 10/02/2016) 100 capsule 3  . Ferrous Sulfate (IRON) 325 (65 FE) MG TABS Take 325 mg by mouth daily. (Patient not taking: Reported on 10/02/2016) 30 each 0  . doxycycline (VIBRA-TABS) 100 MG tablet Take 1 tablet (100 mg total) by mouth 2 (two) times daily. (Patient not taking: Reported on 10/02/2016) 20 tablet 0   No facility-administered medications prior to visit.     ROS Review of Systems  Constitutional: Negative for activity change, appetite change, chills, fatigue and unexpected weight change.  HENT: Negative for congestion, mouth sores and sinus pressure.   Eyes: Negative for visual disturbance.  Respiratory: Negative for cough and chest tightness.   Gastrointestinal: Negative for abdominal pain and nausea.  Genitourinary: Negative for difficulty urinating, frequency and vaginal pain.  Musculoskeletal: Negative for back pain and gait problem.  Skin: Negative for pallor and rash.  Neurological: Negative for dizziness, tremors, weakness, numbness and headaches.  Psychiatric/Behavioral: Negative for confusion, sleep disturbance and suicidal ideas.    Objective:  BP 120/80   Pulse 60   Temp 98.5 F (36.9 C) (Oral)   Ht 5\' 5"  (1.651 m)   Wt 226 lb (102.5 kg)   SpO2 99%   BMI 37.61 kg/m   BP Readings from Last 3 Encounters:  10/02/16 120/80  06/25/16 130/80  06/01/16 132/76    Wt Readings from Last 3  Encounters:  10/02/16 226 lb (102.5 kg)  06/25/16 219 lb (99.3 kg)  06/01/16 222 lb (100.7 kg)    Physical Exam  Constitutional: She appears well-developed. No distress.  HENT:  Head: Normocephalic.  Right Ear: External ear normal.  Left Ear: External ear normal.  Nose: Nose normal.  Mouth/Throat: Oropharynx is clear and moist.  Eyes: Conjunctivae are normal. Pupils are equal, round, and reactive to light. Right eye exhibits no discharge. Left eye exhibits no discharge.  Neck: Normal range of motion. Neck supple. No JVD present. No tracheal deviation present. No thyromegaly present.  Cardiovascular: Normal rate, regular rhythm and normal heart sounds.   Pulmonary/Chest: No stridor. No respiratory distress. She has no wheezes.  Abdominal: Soft. Bowel sounds are normal. She exhibits no distension and no mass. There is no tenderness. There is no rebound and no guarding.  Musculoskeletal: She exhibits no edema or tenderness.  Lymphadenopathy:    She has no cervical adenopathy.  Neurological: She displays normal reflexes. No cranial nerve deficit. She exhibits normal muscle tone. Coordination normal.  Skin: No rash noted. No erythema.  Psychiatric: She has a normal mood and affect. Her behavior is normal. Judgment and thought content normal.  obese  Lab Results  Component Value Date   WBC 7.2 06/09/2015   HGB 14.4 06/09/2015   HCT 42.7 06/09/2015   PLT 214.0 06/09/2015   GLUCOSE 93 06/09/2015   CHOL 247 (H) 05/01/2013   TRIG 150.0 (H) 05/01/2013   HDL 43.60 05/01/2013  LDLDIRECT 181.4 05/01/2013   ALT 15 06/09/2015   AST 16 06/09/2015   NA 137 06/09/2015   K 4.3 06/09/2015   CL 106 06/09/2015   CREATININE 0.74 06/09/2015   BUN 17 06/09/2015   CO2 25 06/09/2015   TSH 1.99 06/09/2015   INR 1.0 07/27/2014   HGBA1C 6.0 02/06/2008    Ct Soft Tissue Neck W Contrast  Result Date: 08/03/2016 CLINICAL DATA:  55 year old female with right submandibular region enlargement for 3  months. Chronic sore throat, pain. Subsequent encounter. EXAM: CT NECK WITH CONTRAST TECHNIQUE: Multidetector CT imaging of the neck was performed using the standard protocol following the bolus administration of intravenous contrast. CONTRAST:  64mL ISOVUE-300 IOPAMIDOL (ISOVUE-300) INJECTION 61% COMPARISON:  Neck ultrasound 07/19/2016. Cervical spine MRI 10/20/2013. FINDINGS: Pharynx and larynx: Negative larynx. Pharyngeal soft tissue contours are normal. Negative parapharyngeal and retropharyngeal spaces. Salivary glands: Negative sublingual space. 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 Skin marker was placed over the right submandibular gland (series 2, image 44). Both submandibular glands appear symmetric and within normal limits. Mild intraglandular ductal ectasia noted bilaterally with no main submandibular duct enlargement. No sialolithiasis. Both parotid glands also appear symmetric and within normal limits. Thyroid: Asymmetric enlargement of the right thyroid lobe which contains heterogeneous partially hypodense nodule measuring 17 mm largest dimension (coronal image 43). The thyroid was not included on the recent neck soft tissue ultrasound. The left lobe is normal. The isthmus is mildly enlarged but otherwise normal. Lymph nodes: No cervical lymphadenopathy. Bilateral cervical lymph nodes are diminutive and normal. The lymph node recently demonstrated by ultrasound was diminutive with a normal fatty hilum. Vascular: Major vascular structures in the neck and at the skullbase are patent. Limited intracranial: Negative. Visualized orbits: Not included. Mastoids and visualized paranasal sinuses: Clear. Skeleton: No acute dental abnormality. Lower cervical spine chronic disc and endplate degeneration. No acute osseous abnormality identified. There is E elongation of the bilateral stylohyoid ligaments, up  to 4 cm in length bilaterally. Upper chest: Negative lung apices. No superior mediastinal lymphadenopathy. Small volume fluid in the visible thoracic esophagus. No axillary lymphadenopathy. IMPRESSION: 1. Normal CT appearance of the right submandibular and other salivary glands. No sialolithiasis or evidence of acute sialadenitis. 2. Asymmetric enlargement of the right thyroid lobe with a 17 mm nodule which meets consensus criteria for ultrasound characterization. Unfortunately, the thyroid was not included on the recent ultrasound images. Therefore follow-up Thyroid Ultrasound will be necessary. 3. Normal lymph nodes throughout the bilateral neck. 4. 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 Electronically Signed   By: Genevie Ann M.D.   On: 08/03/2016 15:32    Assessment & Plan:   There are no diagnoses linked to this encounter. I have discontinued Ms. Revak's doxycycline. I am also having her maintain her Iron, b complex vitamins, Vitamin D3, and ergocalciferol.  No orders of the defined types were placed in this encounter.    Follow-up: No Follow-up on file.  Walker Kehr, MD

## 2016-10-02 NOTE — Assessment & Plan Note (Signed)
Colon 2014 - Dr Deatra Ina (polypectomy w/a bleeding complication) GI ref

## 2016-10-02 NOTE — Progress Notes (Signed)
Pre visit review using our clinic review tool, if applicable. No additional management support is needed unless otherwise documented below in the visit note. 

## 2016-10-02 NOTE — Assessment & Plan Note (Signed)
Wt Readings from Last 3 Encounters:  10/02/16 226 lb (102.5 kg)  06/25/16 219 lb (99.3 kg)  06/01/16 222 lb (100.7 kg)   Labs

## 2016-10-02 NOTE — Assessment & Plan Note (Signed)
We discussed age appropriate health related issues, including available/recomended screening tests and vaccinations. We discussed a need for adhering to healthy diet and exercise. Labs/EKG were reviewed/ordered. All questions were answered.  BDS Colon 2014 - Dr Deatra Ina (polypectomy w/a bleeding complication)

## 2016-10-03 LAB — HEPATITIS C ANTIBODY: HCV Ab: NEGATIVE

## 2016-10-06 LAB — VITAMIN B1: VITAMIN B1 (THIAMINE): 7 nmol/L — AB (ref 8–30)

## 2016-10-10 ENCOUNTER — Telehealth: Payer: Self-pay | Admitting: Emergency Medicine

## 2016-10-10 NOTE — Telephone Encounter (Signed)
Patient is calling about this. Please follow up, Thank you.

## 2016-10-10 NOTE — Telephone Encounter (Signed)
What testing does she need? Thx

## 2016-10-10 NOTE — Telephone Encounter (Signed)
Pt had 3D screening mammogram 2 weeks ago with a mobile, [facility] which resulted in insufficient results.   We can call [facility] @ [phone] to get results/findings.   They have called her back requesting her to do some additional testing in [place]. Can you place a order for additional testing? Please advise.

## 2016-10-10 NOTE — Telephone Encounter (Signed)
Pt called and asked that you give her a call about her breast exam. Please advise thanks.

## 2016-10-12 NOTE — Telephone Encounter (Signed)
Can I see a mammo report from [place]? Thx

## 2016-10-12 NOTE — Telephone Encounter (Signed)
What ever testing you recommend. Please advise.

## 2016-10-15 NOTE — Telephone Encounter (Signed)
I called number below for mammogram results. No answer. Left detailed mess requesting results to be faxed to [place] at [phone] for PCP review.

## 2016-10-29 NOTE — Telephone Encounter (Signed)
Patient is calling in about this. Wanting to know if you got the results yet. [phone]   Please follow up with patient. Thank you.

## 2016-10-30 NOTE — Telephone Encounter (Signed)
Faxed request for mammogram results to [phone].

## 2016-10-30 NOTE — Telephone Encounter (Signed)
What do I need to do? Thx

## 2016-12-03 ENCOUNTER — Encounter: Payer: Self-pay | Admitting: Internal Medicine

## 2017-04-09 ENCOUNTER — Other Ambulatory Visit (INDEPENDENT_AMBULATORY_CARE_PROVIDER_SITE_OTHER): Payer: 59

## 2017-04-09 DIAGNOSIS — K922 Gastrointestinal hemorrhage, unspecified: Secondary | ICD-10-CM | POA: Diagnosis not present

## 2017-04-09 DIAGNOSIS — Z0000 Encounter for general adult medical examination without abnormal findings: Secondary | ICD-10-CM | POA: Diagnosis not present

## 2017-04-09 DIAGNOSIS — Z780 Asymptomatic menopausal state: Secondary | ICD-10-CM | POA: Diagnosis not present

## 2017-04-09 LAB — IBC PANEL
IRON: 98 ug/dL (ref 42–145)
Saturation Ratios: 25.5 % (ref 20.0–50.0)
Transferrin: 275 mg/dL (ref 212.0–360.0)

## 2017-04-09 LAB — VITAMIN D 25 HYDROXY (VIT D DEFICIENCY, FRACTURES): VITD: 17.62 ng/mL — ABNORMAL LOW (ref 30.00–100.00)

## 2017-04-15 ENCOUNTER — Ambulatory Visit (INDEPENDENT_AMBULATORY_CARE_PROVIDER_SITE_OTHER): Payer: 59 | Admitting: Internal Medicine

## 2017-04-15 ENCOUNTER — Encounter: Payer: Self-pay | Admitting: Internal Medicine

## 2017-04-15 VITALS — BP 110/76 | HR 76 | Temp 99.3°F | Ht 65.0 in | Wt 227.0 lb

## 2017-04-15 DIAGNOSIS — E559 Vitamin D deficiency, unspecified: Secondary | ICD-10-CM | POA: Diagnosis not present

## 2017-04-15 DIAGNOSIS — Z0000 Encounter for general adult medical examination without abnormal findings: Secondary | ICD-10-CM | POA: Diagnosis not present

## 2017-04-15 DIAGNOSIS — N393 Stress incontinence (female) (male): Secondary | ICD-10-CM

## 2017-04-15 DIAGNOSIS — R32 Unspecified urinary incontinence: Secondary | ICD-10-CM | POA: Diagnosis not present

## 2017-04-15 DIAGNOSIS — Z9884 Bariatric surgery status: Secondary | ICD-10-CM

## 2017-04-15 DIAGNOSIS — R159 Full incontinence of feces: Secondary | ICD-10-CM | POA: Diagnosis not present

## 2017-04-15 DIAGNOSIS — Z23 Encounter for immunization: Secondary | ICD-10-CM

## 2017-04-15 DIAGNOSIS — E65 Localized adiposity: Secondary | ICD-10-CM | POA: Insufficient documentation

## 2017-04-15 MED ORDER — VITAMIN D3 1.25 MG (50000 UT) PO CAPS: 1.0000 | ORAL_CAPSULE | ORAL | 0 refills | Status: DC

## 2017-04-15 NOTE — Assessment & Plan Note (Addendum)
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

## 2017-04-15 NOTE — Assessment & Plan Note (Signed)
low Vitamin D levels - please, start Vit D prescription 50000 iu weekly (Rx emailed to your pharmacy) followed by over-the-counter Vit D 2000 iu daily.

## 2017-04-15 NOTE — Assessment & Plan Note (Signed)
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

## 2017-04-15 NOTE — Assessment & Plan Note (Signed)
We discussed age appropriate health related issues, including available/recomended screening tests and vaccinations. We discussed a need for adhering to healthy diet and exercise. Labs were ordered to be later reviewed . All questions were answered.  Last colon 2014

## 2017-04-15 NOTE — Progress Notes (Signed)
Subjective:  Patient ID: Latasha Hicks, female    DOB: 12/14/1960  Age: 56 y.o. MRN: [id number]  CC: No chief complaint on file.   HPI Latasha Hicks presents for a well exam C/o fat apron interfering w/balance C/o urinary incontinence  Outpatient Medications Prior to Visit  Medication Sig Dispense Refill  . b complex vitamins tablet Take 1 tablet by mouth daily. 100 tablet 3  . Cholecalciferol (VITAMIN D3) 2000 UNITS capsule Take 1 capsule (2,000 Units total) by mouth daily. 100 capsule 3  . ergocalciferol (VITAMIN D2) 50000 UNITS capsule Take 1 capsule (50,000 Units total) by mouth once a week. 6 capsule 0  . Ferrous Sulfate (IRON) 325 (65 FE) MG TABS Take 325 mg by mouth daily. (Patient not taking: Reported on 10/02/2016) 30 each 0   No facility-administered medications prior to visit.     ROS Review of Systems  Constitutional: Negative for activity change, appetite change, chills, fatigue and unexpected weight change.  HENT: Negative for congestion, mouth sores and sinus pressure.   Eyes: Negative for visual disturbance.  Respiratory: Negative for cough and chest tightness.   Gastrointestinal: Negative for abdominal pain and nausea.  Genitourinary: Positive for frequency and urgency. Negative for difficulty urinating and vaginal pain.  Musculoskeletal: Negative for back pain and gait problem.  Skin: Negative for pallor and rash.  Neurological: Negative for dizziness, tremors, weakness, numbness and headaches.  Psychiatric/Behavioral: Negative for confusion, sleep disturbance and suicidal ideas.    Objective:  BP 110/76 (BP Location: Left Arm, Patient Position: Sitting, Cuff Size: Large)   Pulse 76   Temp 99.3 F (37.4 C) (Oral)   Ht 5\' 5"  (1.651 m)   Wt 227 lb (103 kg)   SpO2 99%   BMI 37.77 kg/m   BP Readings from Last 3 Encounters:  04/15/17 110/76  10/02/16 120/80  06/25/16 130/80    Wt Readings from Last 3 Encounters:  04/15/17 227 lb (103 kg)    10/02/16 226 lb (102.5 kg)  06/25/16 219 lb (99.3 kg)    Physical Exam  Constitutional: She appears well-developed. No distress.  HENT:  Head: Normocephalic.  Right Ear: External ear normal.  Left Ear: External ear normal.  Nose: Nose normal.  Mouth/Throat: Oropharynx is clear and moist.  Eyes: Conjunctivae are normal. Pupils are equal, round, and reactive to light. Right eye exhibits no discharge. Left eye exhibits no discharge.  Neck: Normal range of motion. Neck supple. No JVD present. No tracheal deviation present. No thyromegaly present.  Cardiovascular: Normal rate, regular rhythm and normal heart sounds.   Pulmonary/Chest: No stridor. No respiratory distress. She has no wheezes.  Abdominal: Soft. Bowel sounds are normal. She exhibits no distension and no mass. There is no tenderness. There is no rebound and no guarding.  Musculoskeletal: She exhibits no edema or tenderness.  Lymphadenopathy:    She has no cervical adenopathy.  Neurological: She displays normal reflexes. No cranial nerve deficit. She exhibits normal muscle tone. Coordination normal.  Skin: No rash noted. No erythema.  Psychiatric: She has a normal mood and affect. Her behavior is normal. Judgment and thought content normal.  large abd apron  Lab Results  Component Value Date   WBC 6.8 10/02/2016   HGB 14.8 10/02/2016   HCT 43.3 10/02/2016   PLT 282.0 10/02/2016   GLUCOSE 89 10/02/2016   CHOL 240 (H) 10/02/2016   TRIG 156.0 (H) 10/02/2016   HDL 51.60 10/02/2016   LDLDIRECT 181.4 05/01/2013   [place]  157 (H) 10/02/2016   ALT 30 10/02/2016   AST 23 10/02/2016   NA 139 10/02/2016   K 3.9 10/02/2016   CL 106 10/02/2016   CREATININE 0.74 10/02/2016   BUN 13 10/02/2016   CO2 24 10/02/2016   TSH 1.91 10/02/2016   INR 1.0 07/27/2014   HGBA1C 6.0 02/06/2008    Dg Bone Density (dxa)  Result Date: 10/02/2016 Date of study: 10/02/2016 Exam: DUAL X-RAY ABSORPTIOMETRY (DXA) FOR BONE MINERAL DENSITY (BMD)  Instrument: [employer] Provider: PCP Indication: screening for osteoporosis Comparison: none (please note that it is not possible to compare data from different instruments) Clinical data: Pt is a postmenopausal 56 y.o. female without previous nontraumatic fractures. On vitamin D. Results:  Lumbar spine (L1-L4) Femoral neck (FN) T-score +2.1  RFN:+0.7 LFN:+0.9  Assessment: the BMD is normal according to the [facility] classification for osteoporosis (see below). Fracture risk: low FRAX score: not calculated due to normal BMD Comments: the technical quality of the study is good Recommend optimizing calcium (1200 mg/day) and vitamin D (800 IU/day) intake. No pharmacological treatment is indicated. Followup: Repeat BMD is appropriate after 2 years. WHO criteria for diagnosis of osteoporosis in postmenopausal women and in men 63 y/o or older: - normal: T-score -1.0 to + 1.0 - osteopenia/low bone density: T-score between -2.5 and -1.0 - osteoporosis: T-score below -2.5 - severe osteoporosis: T-score below -2.5 with history of fragility fracture Note: although not part of the WHO classification, the presence of a fragility fracture, regardless of the T-score, should be considered diagnostic of osteoporosis, provided other causes for the fracture have been excluded. Philemon Kingdom, MD [facility]    Assessment & Plan:   There are no diagnoses linked to this encounter. I have discontinued [name] Iron and ergocalciferol. I am also having her maintain her b complex vitamins and Vitamin D3.  No orders of the defined types were placed in this encounter.    Follow-up: No Follow-up on file.  Walker Kehr, MD

## 2017-04-15 NOTE — Assessment & Plan Note (Signed)
Labs

## 2017-04-15 NOTE — Patient Instructions (Signed)
Valerian root for sleep  Vesicare, Myrbetriq can be tried for your bladder

## 2017-04-23 ENCOUNTER — Telehealth: Payer: Self-pay | Admitting: Internal Medicine

## 2017-04-23 NOTE — Telephone Encounter (Signed)
Patient Name: Latasha Hicks DOB: [birth year] Initial Comment Caller states she got a shot in her arm and its now got heat and a knot. Nurse Assessment Guidelines Guideline Title Affirmed Question Affirmed Notes Final Disposition User FINAL ATTEMPT MADE - message left [place], RN, [employer]

## 2017-04-23 NOTE — Telephone Encounter (Signed)
Patient Name: Latasha Hicks  DOB: 12-Oct-1961    Initial Comment Caller states she recd a tetanus shot last Monday. The site is hot and there is a knot in her arm.   Nurse Assessment  Nurse: Raphael Gibney, RN, Vanita Ingles Date/Time (Eastern Time): 04/23/2017 11:43:41 AM  Confirm and document reason for call. If symptomatic, describe symptoms. ---Caller states she had a tetanus immunization a week ago on Monday. Area is tender to the touch she has a knot on the right side. Has red area about the size of a silver dollar.  Does the patient have any new or worsening symptoms? ---Yes  Will a triage be completed? ---Yes  Related visit to physician within the last 2 weeks? ---No  Does the PT have any chronic conditions? (i.e. diabetes, asthma, etc.) ---No  Is the patient pregnant or possibly pregnant? (Ask all females between the ages of 19-55) ---No  Is this a behavioral health or substance abuse call? ---No     Guidelines    Guideline Title Affirmed Question Affirmed Notes  Immunization Reactions [1] Redness or red streak around the injection site AND [2] begins > 48 hours after shot AND [3] no fever (Exception: red area < 1 inch or 2.5 cm wide)    Final Disposition User   See Physician within 7 Hours Stringer, RN, Vanita Ingles    Comments  Pt states she can not come in today or tomorrow but would like appt on Thursday. Please call pt back regarding appt.   Referrals  GO TO FACILITY REFUSED   Disagree/Comply: Disagree  Disagree/Comply Reason: Disagree with instructions

## 2017-04-24 ENCOUNTER — Encounter: Payer: Self-pay | Admitting: Internal Medicine

## 2017-04-24 ENCOUNTER — Ambulatory Visit (INDEPENDENT_AMBULATORY_CARE_PROVIDER_SITE_OTHER): Payer: 59 | Admitting: Internal Medicine

## 2017-04-24 VITALS — BP 104/70 | HR 72 | Ht 66.0 in | Wt 227.0 lb

## 2017-04-24 DIAGNOSIS — L989 Disorder of the skin and subcutaneous tissue, unspecified: Secondary | ICD-10-CM | POA: Insufficient documentation

## 2017-04-24 NOTE — Assessment & Plan Note (Signed)
Hx and exam c/w hypersensitivity rxn to Tdap, now spontaneously improved without specific tx; will add Tdap to allergy list, and no further specific tx is needed, d/w pt and reassured, no indication for antibiotic at this time

## 2017-04-24 NOTE — Progress Notes (Signed)
   Subjective:    Patient ID: Latasha Hicks, female    DOB: [birth year], 56 y.o.   MRN: [id number]  HPI  Here after received Tdap injection to right deltoid recently per Dr Alain Marion visit, with next day marked erythema swelling itch and heat reaction, without fever or drainage.  Overall improved today without specific tx after called yesterday for appt, came in b/c did not want to get the missed OV charge.  Pt denies chest pain, increased sob or doe, wheezing, orthopnea, PND, increased LE swelling, palpitations, dizziness or syncope.  Past Medical History:  Diagnosis Date  . Allergic rhinitis   . Hemorrhoids   . Hydronephrosis   . Kidney stones   . Obstructive sleep apnea   . Osteoarthritis   . Perianal abscess 05/01/2013   6/14 7 o'clock   . Personal history of colonic polyps    Question accuracy, patient report only.  . VENOUS INSUFFICIENCY, CHRONIC 07/05/2007   Annotation: LE Qualifier: Diagnosis of  By: Dance CMA ([place]), Kim    . Vitamin D deficiency    Past Surgical History:  Procedure Laterality Date  . COLONOSCOPY N/A 09/04/2013   Procedure: COLONOSCOPY;  Surgeon: Inda Castle, MD;  Location: [facility];  Service: Endoscopy;  Laterality: N/A;  . COLONOSCOPY W/ POLYPECTOMY    . HIATAL HERNIA REPAIR    . LAPAROSCOPIC CHOLECYSTECTOMY    . LAPAROSCOPIC GASTRIC BANDING    . VAGINAL HYSTERECTOMY      reports that she has never smoked. She has never used smokeless tobacco. She reports that she drinks about 1.8 oz of alcohol per week . She reports that she does not use drugs. family history includes Diabetes in her mother. Allergies  Allergen Reactions  . Clarithromycin     REACTION: gi upset  . Tetanus Toxoids Other (See Comments)    Right arm large erythem reaction spontaneously improved   Current Outpatient Prescriptions on File Prior to Visit  Medication Sig Dispense Refill  . b complex vitamins tablet Take 1 tablet by mouth daily. 100 tablet 3  . Cholecalciferol  (VITAMIN D3) 2000 UNITS capsule Take 1 capsule (2,000 Units total) by mouth daily. 100 capsule 3  . Cholecalciferol (VITAMIN D3) 50000 units CAPS Take 1 capsule by mouth once a week. 8 capsule 0   No current facility-administered medications on file prior to visit.    Review of Systems All otherwise neg per pt     Objective:   Physical Exam BP 104/70   Pulse 72   Ht 5\' 6"  (1.676 m)   Wt 227 lb (103 kg)   SpO2 100%   BMI 36.64 kg/m  VS noted,  Constitutional: Pt appears in NAD HENT: Head: NCAT.  Right Ear: External ear normal.  Left Ear: External ear normal.  Eyes: . Pupils are equal, round, and reactive to light. Conjunctivae and EOM are normal Nose: without d/c or deformity Neck: Neck supple. Gross normal ROM Cardiovascular: Normal rate and regular rhythm.   Pulmonary/Chest: Effort normal and breath sounds without rales or wheezing.  Neurological: Pt is alert. At baseline orientation, motor grossly intact Skin:  No rashes, other new lesions, no LE edema except for minor faint erythema and 1/2 vague swelling at the injection site, no fluctuance or drainage Psychiatric: Pt behavior is normal without agitation  No other exam findings    Assessment & Plan:

## 2017-04-24 NOTE — Patient Instructions (Signed)
OK to hold on antibiotics now.  Please continue all other medications as before, and refills have been done if requested.  Please have the pharmacy call with any other refills you may need.  Please keep your appointments with your specialists as you may have planned

## 2017-05-03 ENCOUNTER — Ambulatory Visit (INDEPENDENT_AMBULATORY_CARE_PROVIDER_SITE_OTHER): Payer: 59 | Admitting: Orthopedic Surgery

## 2017-05-03 ENCOUNTER — Ambulatory Visit (INDEPENDENT_AMBULATORY_CARE_PROVIDER_SITE_OTHER): Payer: Self-pay | Admitting: Physical Medicine and Rehabilitation

## 2017-05-03 ENCOUNTER — Encounter (INDEPENDENT_AMBULATORY_CARE_PROVIDER_SITE_OTHER): Payer: Self-pay | Admitting: Orthopedic Surgery

## 2017-05-03 VITALS — Ht 66.0 in | Wt 227.0 lb

## 2017-05-03 DIAGNOSIS — M7662 Achilles tendinitis, left leg: Secondary | ICD-10-CM | POA: Diagnosis not present

## 2017-05-03 DIAGNOSIS — M6702 Short Achilles tendon (acquired), left ankle: Secondary | ICD-10-CM

## 2017-05-03 MED ORDER — LIDOCAINE HCL 1 % IJ SOLN
2.0000 mL | INTRAMUSCULAR | Status: AC | PRN
  Administered 2017-05-03: 2 mL

## 2017-05-03 MED ORDER — METHYLPREDNISOLONE ACETATE 40 MG/ML IJ SUSP
40.0000 mg | INTRAMUSCULAR | Status: AC | PRN
  Administered 2017-05-03: 40 mg via INTRA_ARTICULAR

## 2017-05-03 NOTE — Progress Notes (Signed)
Office Visit Note   Patient: Latasha Hicks           Date of Birth: 12/12/1960           MRN: [id number] Visit Date: 05/03/2017              Requested by: Cassandria Anger, MD [place], [place] [address] PCP: Cassandria Anger, MD  Chief Complaint  Patient presents with  . Left Foot - Pain    insertional achilles tendinitis s/p injection 06/21/16      HPI: Patient is a 56 year old woman who is about a year status post an Achilles injection on the left. She states that the injection lasted about 10 months. He complains of pain and swelling with activities of daily living.  Assessment & Plan: Visit Diagnoses:  1. Achilles tendinitis, left leg   2. Achilles tendon contracture, left     Plan: The retro-Achilles bursa was injected. Patient was given instructions demonstrate heel cord stretching. Follow-up as needed.  Follow-Up Instructions: Return if symptoms worsen or fail to improve.   Ortho Exam  Patient is alert, oriented, no adenopathy, well-dressed, normal affect, normal respiratory effort. Examination patient has an antalgic gait. She does have heel cord contracture the left with dorsiflexion only to neutral with her knee extended there is no redness no cellulitis no nodular changes. She is tender to palpation along the Achilles. She has a good pulse.  Imaging: No results found.  Labs: Lab Results  Component Value Date   HGBA1C 6.0 02/06/2008   ESRSEDRATE 24 03/06/2007    Orders:  No orders of the defined types were placed in this encounter.  No orders of the defined types were placed in this encounter.    Procedures: Medium Joint Inj Date/Time: 05/03/2017 8:46 AM Performed by: DUDA, MARCUS V Authorized by: Newt Minion   Consent Given by:  Patient Site marked: the procedure site was marked   Timeout: prior to procedure the correct patient, procedure, and site was verified   Indications:  Pain and diagnostic evaluation Location:   Ankle Site:  L ankle Prep: patient was prepped and draped in usual sterile fashion   Needle Size:  22 G Needle Length:  1.5 inches Approach:  Posterior Ultrasound Guided: No   Fluoroscopic Guidance: No   Medications:  2 mL lidocaine 1 %; 40 mg methylPREDNISolone acetate 40 MG/ML Aspiration Attempted: No   Patient tolerance:  Patient tolerated the procedure well with no immediate complications    Clinical Data: No additional findings.  ROS:  All other systems negative, except as noted in the HPI. Review of Systems  Objective: Vital Signs: Ht 5\' 6"  (1.676 m)   Wt 227 lb (103 kg)   BMI 36.64 kg/m   Specialty Comments:  No specialty comments available.  PMFS History: Patient Active Problem List   Diagnosis Date Noted  . Skin lesion 04/24/2017  . Symptomatic abdominal apron 04/15/2017  . H/O laparoscopic adjustable gastric banding 04/15/2017  . Well adult exam 10/02/2016  . Salivary gland infection 06/01/2016  . Paresthesia 06/10/2015  . Left leg swelling 07/26/2014  . Benign neoplasm of colon 09/04/2013  . Colonic ulcer 09/04/2013  . GI bleed 09/03/2013  . Urinary incontinence 08/21/2013  . Internal hemorrhoids 08/21/2013  . Unspecified constipation 06/12/2013  . LBP (low back pain) 06/12/2013  . History of colonic polyps 06/12/2013  . Right LBP 05/06/2013  . Perianal abscess 05/01/2013  . GRIEF REACTION 03/26/2008  .  ALLERGIC RHINITIS 03/26/2008  . OSTEOARTHRITIS 03/26/2008  . Bariatric surgery status 03/26/2008  . Vitamin D deficiency 07/05/2007  . OBESITY, MORBID 07/05/2007  . Obstructive sleep apnea 07/05/2007  . VENOUS INSUFFICIENCY, CHRONIC 07/05/2007  . CHOLELITHIASIS 07/05/2007  . HYDRONEPHROSIS 07/05/2007   Past Medical History:  Diagnosis Date  . Allergic rhinitis   . Hemorrhoids   . Hydronephrosis   . Kidney stones   . Obstructive sleep apnea   . Osteoarthritis   . Perianal abscess 05/01/2013   6/14 7 o'clock   . Personal history of colonic  polyps    Question accuracy, patient report only.  . VENOUS INSUFFICIENCY, CHRONIC 07/05/2007   Annotation: LE Qualifier: Diagnosis of  By: Dance CMA ([place]), Kim    . Vitamin D deficiency     Family History  Problem Relation Age of Onset  . Diabetes Mother     Past Surgical History:  Procedure Laterality Date  . COLONOSCOPY N/A 09/04/2013   Procedure: COLONOSCOPY;  Surgeon: Inda Castle, MD;  Location: [facility];  Service: Endoscopy;  Laterality: N/A;  . COLONOSCOPY W/ POLYPECTOMY    . HIATAL HERNIA REPAIR    . LAPAROSCOPIC CHOLECYSTECTOMY    . LAPAROSCOPIC GASTRIC BANDING    . VAGINAL HYSTERECTOMY     Social History   Occupational History  . Not on file.   Social History Main Topics  . Smoking status: Never Smoker  . Smokeless tobacco: Never Used  . Alcohol use 1.8 oz/week    3 Glasses of wine per week  . Drug use: No  . Sexual activity: Yes

## 2017-05-30 ENCOUNTER — Ambulatory Visit (INDEPENDENT_AMBULATORY_CARE_PROVIDER_SITE_OTHER): Payer: 59 | Admitting: Orthopedic Surgery

## 2017-05-30 ENCOUNTER — Encounter (INDEPENDENT_AMBULATORY_CARE_PROVIDER_SITE_OTHER): Payer: Self-pay | Admitting: Orthopedic Surgery

## 2017-05-30 VITALS — Ht 66.0 in | Wt 227.0 lb

## 2017-05-30 DIAGNOSIS — M7662 Achilles tendinitis, left leg: Secondary | ICD-10-CM

## 2017-05-30 DIAGNOSIS — M6702 Short Achilles tendon (acquired), left ankle: Secondary | ICD-10-CM

## 2017-05-30 MED ORDER — LIDOCAINE HCL 1 % IJ SOLN
2.0000 mL | INTRAMUSCULAR | Status: AC | PRN
  Administered 2017-05-30: 2 mL

## 2017-05-30 MED ORDER — METHYLPREDNISOLONE ACETATE 40 MG/ML IJ SUSP
40.0000 mg | INTRAMUSCULAR | Status: AC | PRN
  Administered 2017-05-30: 40 mg via INTRA_ARTICULAR

## 2017-05-30 NOTE — Progress Notes (Signed)
Office Visit Note   Patient: Latasha Hicks           Date of Birth: 1961-09-21           MRN: [id number] Visit Date: 05/30/2017              Requested by: Cassandria Anger, MD [place], [place] [address] PCP: Cassandria Anger, MD  Chief Complaint  Patient presents with  . Left Leg - Pain    Achilles tendonitis s/p injection 05/03/17      HPI: Patient presents status post injection for Achilles tendinitis at its Achilles tendon contracture. She has been given instructions for heel cord stretching C states she's been trying to do these but she has not been very compliant.  Assessment & Plan: Visit Diagnoses:  1. Achilles tendinitis, left leg   2. Achilles tendon contracture, left     Plan: Recommended continue heel cord stretching. Patient request a injection from the lateral aspect this was provided. She will follow up as needed again the importance of heel cord stretching was discussed and demonstrated.  Follow-Up Instructions: Return if symptoms worsen or fail to improve.   Ortho Exam  Patient is alert, oriented, no adenopathy, well-dressed, normal affect, normal respiratory effort. Examination patient has a good dorsalis pedis pulse she has good ankle good subtalar motion she still has significant heel cord contracture with dorsiflexion only to neutral with her knee extended. She has noted tenderness to palpation along the Achilles she has no nodules no swelling she is point tender to palpation over the lateral insertion of the Achilles tendon.  Imaging: No results found.  Labs: Lab Results  Component Value Date   HGBA1C 6.0 02/06/2008   ESRSEDRATE 24 03/06/2007    Orders:  No orders of the defined types were placed in this encounter.  No orders of the defined types were placed in this encounter.    Procedures: Medium Joint Inj Date/Time: 05/30/2017 4:53 PM Performed by: Paris [place] V Authorized by: Newt Minion   Consent Given by:   Patient Site marked: the procedure site was marked   Timeout: prior to procedure the correct patient, procedure, and site was verified   Indications:  Pain and diagnostic evaluation Location:  Ankle Site:  L ankle Prep: patient was prepped and draped in usual sterile fashion   Needle Size:  22 G Needle Length:  1.5 inches Approach:  Posterior Ultrasound Guided: No   Fluoroscopic Guidance: No   Medications:  2 mL lidocaine 1 %; 40 mg methylPREDNISolone acetate 40 MG/ML Aspiration Attempted: No   Patient tolerance:  Patient tolerated the procedure well with no immediate complications    Clinical Data: No additional findings.  ROS:  All other systems negative, except as noted in the HPI. Review of Systems  Objective: Vital Signs: Ht 5\' 6"  (1.676 m)   Wt 227 lb (103 kg)   BMI 36.64 kg/m   Specialty Comments:  No specialty comments available.  PMFS History: Patient Active Problem List   Diagnosis Date Noted  . Skin lesion 04/24/2017  . Symptomatic abdominal apron 04/15/2017  . H/O laparoscopic adjustable gastric banding 04/15/2017  . Well adult exam 10/02/2016  . Salivary gland infection 06/01/2016  . Paresthesia 06/10/2015  . Left leg swelling 07/26/2014  . Benign neoplasm of colon 09/04/2013  . Colonic ulcer 09/04/2013  . GI bleed 09/03/2013  . Urinary incontinence 08/21/2013  . Internal hemorrhoids 08/21/2013  . Unspecified constipation 06/12/2013  .  LBP (low back pain) 06/12/2013  . History of colonic polyps 06/12/2013  . Right LBP 05/06/2013  . Perianal abscess 05/01/2013  . GRIEF REACTION 03/26/2008  . ALLERGIC RHINITIS 03/26/2008  . OSTEOARTHRITIS 03/26/2008  . Bariatric surgery status 03/26/2008  . Vitamin D deficiency 07/05/2007  . OBESITY, MORBID 07/05/2007  . Obstructive sleep apnea 07/05/2007  . VENOUS INSUFFICIENCY, CHRONIC 07/05/2007  . CHOLELITHIASIS 07/05/2007  . HYDRONEPHROSIS 07/05/2007   Past Medical History:  Diagnosis Date  .  Allergic rhinitis   . Hemorrhoids   . Hydronephrosis   . Kidney stones   . Obstructive sleep apnea   . Osteoarthritis   . Perianal abscess 05/01/2013   6/14 7 o'clock   . Personal history of colonic polyps    Question accuracy, patient report only.  . VENOUS INSUFFICIENCY, CHRONIC 07/05/2007   Annotation: LE Qualifier: Diagnosis of  By: Dance CMA ([place]), Kim    . Vitamin D deficiency     Family History  Problem Relation Age of Onset  . Diabetes Mother     Past Surgical History:  Procedure Laterality Date  . COLONOSCOPY N/A 09/04/2013   Procedure: COLONOSCOPY;  Surgeon: Inda Castle, MD;  Location: [facility];  Service: Endoscopy;  Laterality: N/A;  . COLONOSCOPY W/ POLYPECTOMY    . HIATAL HERNIA REPAIR    . LAPAROSCOPIC CHOLECYSTECTOMY    . LAPAROSCOPIC GASTRIC BANDING    . VAGINAL HYSTERECTOMY     Social History   Occupational History  . Not on file.   Social History Main Topics  . Smoking status: Never Smoker  . Smokeless tobacco: Never Used  . Alcohol use 1.8 oz/week    3 Glasses of wine per week  . Drug use: No  . Sexual activity: Yes

## 2017-06-10 ENCOUNTER — Other Ambulatory Visit: Payer: Self-pay | Admitting: Internal Medicine

## 2017-06-10 NOTE — Telephone Encounter (Signed)
Pls advise if ok to refill../lmb 

## 2017-07-30 ENCOUNTER — Encounter: Payer: Self-pay | Admitting: Internal Medicine

## 2017-08-16 ENCOUNTER — Other Ambulatory Visit: Payer: Self-pay | Admitting: Internal Medicine

## 2017-08-24 ENCOUNTER — Ambulatory Visit (INDEPENDENT_AMBULATORY_CARE_PROVIDER_SITE_OTHER): Payer: 59 | Admitting: Family Medicine

## 2017-08-24 ENCOUNTER — Encounter: Payer: Self-pay | Admitting: Family Medicine

## 2017-08-24 VITALS — BP 128/88 | HR 64 | Temp 99.1°F | Resp 12 | Wt 227.4 lb

## 2017-08-24 DIAGNOSIS — H00014 Hordeolum externum left upper eyelid: Secondary | ICD-10-CM

## 2017-08-24 DIAGNOSIS — L03213 Periorbital cellulitis: Secondary | ICD-10-CM

## 2017-08-24 MED ORDER — CIPROFLOXACIN HCL 0.3 % OP OINT: TOPICAL_OINTMENT | Freq: Three times a day (TID) | OPHTHALMIC | 0 refills | Status: AC

## 2017-08-24 MED ORDER — SULFAMETHOXAZOLE-TRIMETHOPRIM 800-160 MG PO TABS: 1.0000 | ORAL_TABLET | Freq: Two times a day (BID) | ORAL | 0 refills | Status: AC

## 2017-08-24 NOTE — Progress Notes (Signed)
ACUTE VISIT   HPI:  Chief Complaint  Patient presents with  . Facial Swelling    left x 3 days, drainage and pain    Ms.JO-ANN JOHANNING is a 56 y.o. female, who is here today with her husband complaining of 3 days left periocular edema and erythema.  She had similar symptoms around right eye last week and resolved. Last week mosquito bite on forehead but no skin lesion or pruritus. Denies recent trauma. Today noted some epiphora. "Sore eye", mild photophobia.   Other  This is a new problem. The current episode started in the past 7 days. The problem occurs constantly. The problem has been gradually worsening. Pertinent negatives include no abdominal pain, anorexia, arthralgias, chills, congestion, coughing, fatigue, fever, headaches, joint swelling, myalgias, nausea, neck pain, numbness, rash, sore throat, swollen glands, visual change, vomiting or weakness. She has tried heat for the symptoms.   She saw her eye care provider, Dx with sty and recommended local heat. Denies conjunctival erythema, purulent discharge, visual changes.   Review of Systems  Constitutional: Negative for activity change, appetite change, chills, fatigue and fever.  HENT: Negative for congestion, ear pain, mouth sores, sinus pressure, sore throat, trouble swallowing and voice change.   Eyes: Positive for photophobia and discharge. Negative for redness, itching and visual disturbance.  Respiratory: Negative for cough, shortness of breath and wheezing.   Gastrointestinal: Negative for abdominal pain, anorexia, diarrhea, nausea and vomiting.  Musculoskeletal: Negative for arthralgias, back pain, joint swelling, myalgias and neck pain.  Skin: Negative for rash and wound.  Neurological: Negative for syncope, weakness, numbness and headaches.  Hematological: Negative for adenopathy. Does not bruise/bleed easily.  Psychiatric/Behavioral: Negative for confusion. The patient is nervous/anxious.       Current Outpatient Prescriptions on File Prior to Visit  Medication Sig Dispense Refill  . b complex vitamins tablet Take 1 tablet by mouth daily. 100 tablet 3  . Cholecalciferol (VITAMIN D3) 50000 units CAPS TAKE ONE CAPSULE BY MOUTH WEEKLY AS ONE DOSE 8 capsule 0  . Cholecalciferol (VITAMIN D3) 2000 UNITS capsule Take 1 capsule (2,000 Units total) by mouth daily. (Patient not taking: Reported on 08/24/2017) 100 capsule 3   No current facility-administered medications on file prior to visit.      Past Medical History:  Diagnosis Date  . Allergic rhinitis   . Hemorrhoids   . Hydronephrosis   . Kidney stones   . Obstructive sleep apnea   . Osteoarthritis   . Perianal abscess 05/01/2013   6/14 7 o'clock   . Personal history of colonic polyps    Question accuracy, patient report only.  . VENOUS INSUFFICIENCY, CHRONIC 07/05/2007   Annotation: LE Qualifier: Diagnosis of  By: Dance CMA ([place]), Kim    . Vitamin D deficiency    Allergies  Allergen Reactions  . Clarithromycin     REACTION: gi upset  . Tetanus Toxoids Other (See Comments)    Right arm large erythem reaction spontaneously improved    Social History   Social History  . Marital status: Married    Spouse name: N/A  . Number of children: N/A  . Years of education: N/A   Social History Main Topics  . Smoking status: Never Smoker  . Smokeless tobacco: Never Used  . Alcohol use 1.8 oz/week    3 Glasses of wine per week  . Drug use: No  . Sexual activity: Yes   Other Topics Concern  . 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   Physical Exam  Nursing note and vitals reviewed. Constitutional: She is oriented to person, place, and time. She appears well-developed. No distress.   HENT:  Head: Normocephalic and atraumatic.  Mouth/Throat: Oropharynx is clear and moist and mucous membranes are normal.  Eyes: Pupils are equal, round, and reactive to light. Conjunctivae and EOM are normal. Left eye exhibits hordeolum. Left eye exhibits no discharge and no exudate. No foreign body present in the left eye.  Tarsal conjunctivae mildly erythematous (upper > lower). Upper tarsal conjunctivae, nasal aspect: 2 1 mm pustular lesions appreciated. Upper eye lid edema and erythema, no induration. Lower eye lid minimal edema and no erythema. Upper eye lid edema limiting palpebral movement (eye opening) and visual fields.  Neck: Neck supple.  Cardiovascular: Normal rate and regular rhythm.   Respiratory: Effort normal and breath sounds normal. No respiratory distress.  Musculoskeletal: She exhibits no edema.  Lymphadenopathy:       Head (left side): No preauricular and no posterior auricular adenopathy present.    She has no cervical adenopathy.  Neurological: She is alert and oriented to person, place, and time. She has normal strength. No cranial nerve deficit. Coordination and gait normal.  Skin: Skin is warm. There is erythema.  Psychiatric: Her mood appears anxious.  Well groomed, good eye contact.    ASSESSMENT AND PLAN:   Ms. Riot was seen today for facial swelling.  Diagnoses and all orders for this visit:   Visual Acuity Screening   Right eye Left eye Both eyes  Without correction: 20/30 20/50 20/25   With correction:       Hordeolum of left upper eyelid, unspecified hordeolum type  Educated about Dx and treatment options. Continue local heat for 3-5 min at the time a few times per day. Topical abx recommended.  Periorbital cellulitis of left eye  Superficial erythema and no involvement of periocular muscles appreciated on exam, no abscess either. Oral abx recommended. Local heat. Clearly instructed about warning signs.  -     ciprofloxacin (CILOXAN)  0.3 % ophthalmic ointment; Place into the left eye 3 (three) times daily. -     sulfamethoxazole-trimethoprim (BACTRIM DS,SEPTRA DS) 800-160 MG tablet; Take 1 tablet by mouth 2 (two) times daily.      Return in about 3 days (around 08/27/2017) for with eye doctor or PCP.   -Ms.Samuel Bouche was advised to seek immediate medical attention if sudden worsening symptoms. She voices understanding.      Rogelio Waynick G. [place], MD  [facility]. [place] office.

## 2017-08-24 NOTE — Patient Instructions (Addendum)
Latasha Hicks I have seen you today for an acute visit.  A few things to remember from today's visit:   Hordeolum of left upper eyelid, unspecified hordeolum type  Periorbital cellulitis of left eye - Plan: ciprofloxacin (CILOXAN) 0.3 % ophthalmic ointment, sulfamethoxazole-trimethoprim (BACTRIM DS,SEPTRA DS) 800-160 MG tablet   Medications prescribed today are intended for short period of time and will not be refill upon request, a follow up appointment might be necessary to discuss continuation of of treatment if appropriate.  Stye A stye is a bump on your eyelid caused by a bacterial infection. A stye can form inside the eyelid (internal stye) or outside the eyelid (external stye). An internal stye may be caused by an infected oil-producing gland inside your eyelid. An external stye may be caused by an infection at the base of your eyelash (hair follicle). Styes are very common. Anyone can get them at any age. They usually occur in just one eye, but you may have more than one in either eye. What are the causes? The infection is almost always caused by bacteria called Staphylococcus aureus. This is a common type of bacteria that lives on your skin. What increases the risk? You may be at higher risk for a stye if you have had one before. You may also be at higher risk if you have:  Diabetes.  Long-term illness.  Long-term eye redness.  A skin condition called seborrhea.  High fat levels in your blood (lipids).  What are the signs or symptoms? Eyelid pain is the most common symptom of a stye. Internal styes are more painful than external styes. Other signs and symptoms may include:  Painful swelling of your eyelid.  A scratchy feeling in your eye.  Tearing and redness of your eye.  Pus draining from the stye.  How is this diagnosed? Your health care provider may be able to diagnose a stye just by examining your eye. The health care provider may also check to make  sure:  You do not have a fever or other signs of a more serious infection.  The infection has not spread to other parts of your eye or areas around your eye.  How is this treated? Most styes will clear up in a few days without treatment. In some cases, you may need to use antibiotic drops or ointment to prevent infection. Your health care provider may have to drain the stye surgically if your stye is:  Large.  Causing a lot of pain.  Interfering with your vision.  This can be done using a thin blade or a needle. Follow these instructions at home:  Take medicines only as directed by your health care provider.  Apply a clean, warm compress to your eye for 10 minutes, 4 times a day.  Do not wear contact lenses or eye makeup until your stye has healed.  Do not try to pop or drain the stye. Contact a health care provider if:  You have chills or a fever.  Your stye does not go away after several days.  Your stye affects your vision.  Your eyeball becomes swollen, red, or painful. This information is not intended to replace advice given to you by your health care provider. Make sure you discuss any questions you have with your health care provider. Document Released: 08/22/2005 Document Revised: 07/08/2016 Document Reviewed: 02/26/2014 Elsevier Interactive Patient Education  [employer].    In general please monitor for signs of worsening symptoms and seek immediate  medical attention if any concerning.  If symptoms are not resolved in a few days/weeks you should schedule a follow up appointment with your doctor, before if needed.  I hope you get better soon!

## 2017-12-09 ENCOUNTER — Ambulatory Visit (INDEPENDENT_AMBULATORY_CARE_PROVIDER_SITE_OTHER): Payer: 59 | Admitting: Orthopedic Surgery

## 2017-12-09 ENCOUNTER — Encounter (INDEPENDENT_AMBULATORY_CARE_PROVIDER_SITE_OTHER): Payer: Self-pay | Admitting: Orthopedic Surgery

## 2017-12-09 VITALS — Ht 66.0 in | Wt 227.0 lb

## 2017-12-09 DIAGNOSIS — M6702 Short Achilles tendon (acquired), left ankle: Secondary | ICD-10-CM

## 2017-12-09 NOTE — Progress Notes (Signed)
Office Visit Note   Patient: Latasha Hicks           Date of Birth: 08/20/1961           MRN: [id number] Visit Date: 12/09/2017              Requested by: Cassandria Anger, MD [place], [place] [address] PCP: Cassandria Anger, MD  Chief Complaint  Patient presents with  . Left Ankle - Pain      HPI: Patient is a 57 year old woman who presents in follow-up for left Achilles tendon contracture.  Patient states she has undergone prolonged conservative therapy she has used a boot with heel lift she has continued to work on Achilles stretching she has had steroid injections anti-inflammatories pain is persist over the past 6 months.  Patient states that she cannot do her activities of daily living due to the pain.  Assessment & Plan: Visit Diagnoses:  1. Achilles tendon contracture, left     Plan: Due to the failure of conservative care patient states that she would like to proceed with a gastrocnemius recession risks and benefits were discussed including approximately 80% chance of this improving her symptoms.  Patient would be out of work for 2 weeks postoperatively.  Follow-Up Instructions: Return in about 1 week (around 12/16/2017).   Ortho Exam  Patient is alert, oriented, no adenopathy, well-dressed, normal affect, normal respiratory effort. Examination patient has a good dorsalis pedis and posterior tibial pulse she has significant heel cord contracture with dorsiflexion about 10 degrees short of neutral with her knee extended.  She does have some swelling at the insertion of the Achilles with a mild Haglund's deformity there is no skin redness no breakdown no palpable defects of the Achilles no nodular changes of the Achilles.  Her Achilles tendon is tender to palpation.  Patient does have an antalgic gait.  Imaging: No results found. No images are attached to the encounter.  Labs: Lab Results  Component Value Date   HGBA1C 6.0 02/06/2008   ESRSEDRATE 24 03/06/2007    @LABSALLVALUES (HGBA1)@  Body mass index is 36.64 kg/m.  Orders:  No orders of the defined types were placed in this encounter.  No orders of the defined types were placed in this encounter.    Procedures: No procedures performed  Clinical Data: No additional findings.  ROS:  All other systems negative, except as noted in the HPI. Review of Systems  Objective: Vital Signs: Ht 5\' 6"  (1.676 m)   Wt 227 lb (103 kg)   BMI 36.64 kg/m   Specialty Comments:  No specialty comments available.  PMFS History: Patient Active Problem List   Diagnosis Date Noted  . Skin lesion 04/24/2017  . Symptomatic abdominal apron 04/15/2017  . H/O laparoscopic adjustable gastric banding 04/15/2017  . Well adult exam 10/02/2016  . Salivary gland infection 06/01/2016  . Paresthesia 06/10/2015  . Left leg swelling 07/26/2014  . Benign neoplasm of colon 09/04/2013  . Colonic ulcer 09/04/2013  . GI bleed 09/03/2013  . Urinary incontinence 08/21/2013  . Internal hemorrhoids 08/21/2013  . Unspecified constipation 06/12/2013  . LBP (low back pain) 06/12/2013  . History of colonic polyps 06/12/2013  . Right LBP 05/06/2013  . Perianal abscess 05/01/2013  . GRIEF REACTION 03/26/2008  . ALLERGIC RHINITIS 03/26/2008  . OSTEOARTHRITIS 03/26/2008  . Bariatric surgery status 03/26/2008  . Vitamin D deficiency 07/05/2007  . OBESITY, MORBID 07/05/2007  . Obstructive sleep apnea 07/05/2007  .  VENOUS INSUFFICIENCY, CHRONIC 07/05/2007  . CHOLELITHIASIS 07/05/2007  . HYDRONEPHROSIS 07/05/2007   Past Medical History:  Diagnosis Date  . Allergic rhinitis   . Hemorrhoids   . Hydronephrosis   . Kidney stones   . Obstructive sleep apnea   . Osteoarthritis   . Perianal abscess 05/01/2013   6/14 7 o'clock   . Personal history of colonic polyps    Question accuracy, patient report only.  . VENOUS INSUFFICIENCY, CHRONIC 07/05/2007   Annotation: LE Qualifier: Diagnosis of   By: Dance CMA ([place]), Kim    . Vitamin D deficiency     Family History  Problem Relation Age of Onset  . Diabetes Mother     Past Surgical History:  Procedure Laterality Date  . COLONOSCOPY N/A 09/04/2013   Procedure: COLONOSCOPY;  Surgeon: Inda Castle, MD;  Location: [facility];  Service: Endoscopy;  Laterality: N/A;  . COLONOSCOPY W/ POLYPECTOMY    . HIATAL HERNIA REPAIR    . LAPAROSCOPIC CHOLECYSTECTOMY    . LAPAROSCOPIC GASTRIC BANDING    . VAGINAL HYSTERECTOMY     Social History   Occupational History  . Not on file  Tobacco Use  . Smoking status: Never Smoker  . Smokeless tobacco: Never Used  Substance and Sexual Activity  . Alcohol use: Yes    Alcohol/week: 1.8 oz    Types: 3 Glasses of wine per week  . Drug use: No  . Sexual activity: Yes

## 2017-12-24 ENCOUNTER — Ambulatory Visit (INDEPENDENT_AMBULATORY_CARE_PROVIDER_SITE_OTHER): Payer: 59 | Admitting: Internal Medicine

## 2017-12-24 ENCOUNTER — Encounter: Payer: Self-pay | Admitting: Internal Medicine

## 2017-12-24 ENCOUNTER — Ambulatory Visit (INDEPENDENT_AMBULATORY_CARE_PROVIDER_SITE_OTHER)
Admission: RE | Admit: 2017-12-24 | Discharge: 2017-12-24 | Disposition: A | Discharge status: Home / Self Care | Payer: 59 | Source: Ambulatory Visit | Attending: Internal Medicine | Admitting: Internal Medicine

## 2017-12-24 ENCOUNTER — Other Ambulatory Visit: Payer: Self-pay | Admitting: Internal Medicine

## 2017-12-24 VITALS — BP 136/88 | HR 63 | Temp 98.5°F | Ht 66.0 in | Wt 233.0 lb

## 2017-12-24 DIAGNOSIS — R109 Unspecified abdominal pain: Secondary | ICD-10-CM | POA: Diagnosis not present

## 2017-12-24 DIAGNOSIS — R3129 Other microscopic hematuria: Secondary | ICD-10-CM

## 2017-12-24 DIAGNOSIS — R319 Hematuria, unspecified: Secondary | ICD-10-CM | POA: Insufficient documentation

## 2017-12-24 DIAGNOSIS — M545 Low back pain, unspecified: Secondary | ICD-10-CM

## 2017-12-24 DIAGNOSIS — N1330 Unspecified hydronephrosis: Secondary | ICD-10-CM | POA: Diagnosis not present

## 2017-12-24 DIAGNOSIS — R1032 Left lower quadrant pain: Secondary | ICD-10-CM

## 2017-12-24 DIAGNOSIS — R10A2 Flank pain, left side: Secondary | ICD-10-CM | POA: Insufficient documentation

## 2017-12-24 LAB — POCT URINALYSIS DIPSTICK
Bilirubin, UA: NEGATIVE
GLUCOSE UA: NEGATIVE
LEUKOCYTES UA: NEGATIVE
NITRITE UA: NEGATIVE
PH UA: 6 (ref 5.0–8.0)
UROBILINOGEN UA: 0.2 U/dL

## 2017-12-24 MED ORDER — HYDROCODONE-ACETAMINOPHEN 5-325 MG PO TABS: 1.0000 | ORAL_TABLET | Freq: Four times a day (QID) | ORAL | 0 refills | Status: DC | PRN

## 2017-12-24 MED ORDER — ONDANSETRON 4 MG PO TBDP
4.0000 mg | ORAL_TABLET | Freq: Once | ORAL | Status: AC
  Administered 2017-12-24: 4 mg via ORAL

## 2017-12-24 MED ORDER — TAMSULOSIN HCL 0.4 MG PO CAPS: 0.4000 mg | ORAL_CAPSULE | Freq: Every day | ORAL | 0 refills | Status: DC

## 2017-12-24 MED ORDER — KETOROLAC TROMETHAMINE 30 MG/ML IJ SOLN
30.0000 mg | Freq: Once | INTRAMUSCULAR | Status: AC
  Administered 2017-12-24: 30 mg via INTRAMUSCULAR

## 2017-12-24 MED ORDER — ONDANSETRON HCL 4 MG PO TABS: 4.0000 mg | ORAL_TABLET | Freq: Three times a day (TID) | ORAL | 0 refills | Status: DC | PRN

## 2017-12-24 NOTE — Patient Instructions (Addendum)
We are working on getting the CT scan today to check on the size of the kidney stones.   We have given you an injection of pain medicine and nausea medicine today.  We have sent in a prescription for nausea (zofran) which you can take as needed and hydrocodone that you can take 1 pill up to 4 times per day for pain as needed.   If the pain is not controlled at home this would be a reason to go to the ER. If the nausea is not controlled and you cannot eat or drink go to the ER. We will let you know about the CT scan results as soon as we can.  Work on increasing fluids as this helps to flush out the kidney stones to help you feel better.    Kidney Stones Kidney stones (urolithiasis) are rock-like masses that form inside of the kidneys. Kidneys are organs that make pee (urine). A kidney stone can cause very bad pain and can block the flow of pee. The stone usually leaves your body (passes) through your pee. You may need to have a doctor take out the stone. Follow these instructions at home: Eating and drinking  Drink enough fluid to keep your pee clear or pale yellow. This will help you pass the stone.  If told by your doctor, change the foods you eat (your diet). This may include: ? Limiting how much salt (sodium) you eat. ? Eating more fruits and vegetables. ? Limiting how much meat, poultry, fish, and eggs you eat.  Follow instructions from your doctor about eating or drinking restrictions. General instructions  Collect pee samples as told by your doctor. You may need to collect a pee sample: ? 24 hours after a stone comes out. ? 8-12 weeks after a stone comes out, and every 6-12 months after that.  Strain your pee every time you pee (urinate), for as long as told. Use the strainer that your doctor recommends.  Do not throw out the stone. Keep it so that it can be tested by your doctor.  Take over-the-counter and prescription medicines only as told by your doctor.  Keep all  follow-up visits as told by your doctor. This is important. You may need follow-up tests. Preventing kidney stones To prevent another kidney stone:  Drink enough fluid to keep your pee clear or pale yellow. This is the best way to prevent kidney stones.  Eat healthy foods.  Avoid certain foods as told by your doctor. You may be told to eat less protein.  Stay at a healthy weight.  Contact a doctor if:  You have pain that gets worse or does not get better with medicine. Get help right away if:  You have a fever or chills.  You get very bad pain.  You get new pain in your belly (abdomen).  You pass out (faint).  You cannot pee. This information is not intended to replace advice given to you by your health care provider. Make sure you discuss any questions you have with your health care provider. Document Released: 04/30/2008 Document Revised: 07/31/2016 Document Reviewed: 07/31/2016 Elsevier Interactive Patient Education  2017 [employer].

## 2017-12-24 NOTE — Assessment & Plan Note (Addendum)
Suspect kidney stones given history, hematuria and pain location. Needs CT scan to check on size and location of stones. Especially given the hydronephrosis on past CT scan which has no follow up. She is not eating or drinking well. Given rx for hydrocodone and zofran to help with nausea and pain. She is advised to drink more liquids and that if her pain is uncontrolled or nausea uncontrolled needs to seek care in ED. [place] narcotic database reviewed and no inappropriate fills prior to rx for pain.

## 2017-12-24 NOTE — Assessment & Plan Note (Signed)
It is unclear if this ever had evaluation but had moderate hydronephrosis on CT scan back in 2000 and I cannot find further evaluation. Patient states had a CT scan about 3-4 years ago with kidney stones but I cannot review this either. Ordered CT scan and will need follow up of this finding.

## 2017-12-24 NOTE — Assessment & Plan Note (Signed)
Suspect from kidney stones given the symptoms. U/A done in the office with hematuria and signs of dehydration. She is not eating or drinking well. Advised to increase liquids to 1/2 gallon per day for possible kidney stones. Checking CT renal to evaluate size of stones.

## 2017-12-24 NOTE — Progress Notes (Signed)
   Subjective:    Patient ID: Latasha Hicks, female    DOB: 03/27/1961, 57 y.o.   MRN: [id number]  HPI The patient is a 57 YO female coming in for left back pain and radiates into the lower left stomach. Started about 2-3 days ago and she states it is severe. Has had prior history of cholecystectomy, hysterectomy, and lap band procedure. She admits having kidney stones in the past. She has them for about the last 3-4 years. She states that she thinks she passed a stone about 2-3 weeks ago and was hoping that was the last of it. Pain is 9/10 pain starting on Sunday. She took some advil and hydrocodone at home (she had some leftover). She tried to work Monday but had to leave due to pain. She was in very severe pain then but did not seek care in the ER as she just took the hydrocodone she had at home. She denies fevers or chills. She denies vomiting but is nauseous. She denies constipation or diarrhea. She is having some blood in urine off and on. Denies fevers or chills. Overall the pain is worsening. This is why she decided to come in.   Review of Systems  Constitutional: Positive for activity change and appetite change. Negative for chills, fatigue, fever and unexpected weight change.  HENT: Negative.   Eyes: Negative.   Respiratory: Negative for cough, chest tightness and shortness of breath.   Cardiovascular: Negative for chest pain, palpitations and leg swelling.  Gastrointestinal: Positive for abdominal pain and nausea. Negative for abdominal distention, anal bleeding, blood in stool, constipation, diarrhea and vomiting.  Genitourinary: Positive for flank pain and hematuria.  Musculoskeletal: Positive for back pain and myalgias. Negative for arthralgias.  Skin: Negative.   Neurological: Negative.   Psychiatric/Behavioral: Negative.       Objective:   Physical Exam  Constitutional: She is oriented to person, place, and time. She appears well-developed and well-nourished. She appears  distressed.  Appears uncomfortable  HENT:  Head: Normocephalic and atraumatic.  Eyes: EOM are normal.  Neck: Normal range of motion.  Cardiovascular: Normal rate and regular rhythm.  Pulmonary/Chest: Effort normal and breath sounds normal. No respiratory distress. She has no wheezes. She has no rales.  Abdominal: Soft. Bowel sounds are normal. She exhibits no distension and no mass. There is tenderness. There is no rebound and no guarding.  Tender left abdomen and no radiation from right abdomen  Musculoskeletal: She exhibits tenderness. She exhibits no edema.  Pain in the left flank and radiates to the left abdomen  Neurological: She is alert and oriented to person, place, and time. Coordination normal.  Skin: Skin is warm and dry.  Psychiatric: She has a normal mood and affect.   Vitals:   12/24/17 0802  BP: 136/88  Pulse: 63  Temp: 98.5 F (36.9 C)  TempSrc: Oral  SpO2: 100%  Weight: 233 lb (105.7 kg)  Height: 5\' 6"  (1.676 m)      Assessment & Plan:  Toradol 30 mg IM and zofran 4 mg ODT given at visit

## 2017-12-26 ENCOUNTER — Telehealth: Payer: Self-pay | Admitting: Internal Medicine

## 2017-12-26 NOTE — Telephone Encounter (Signed)
Copied from [place] [id number]. Topic: Quick Communication - See Telephone Encounter >> Dec 26, 2017 11:01 AM Ahmed Prima L wrote: CRM for notification. See Telephone encounter for:   12/26/17.  Patient said she came in earlier this week for kidney stones & states she is not feeling any better. She would like a nurse to give her call back [phone]

## 2017-12-26 NOTE — Telephone Encounter (Signed)
Pt notified that she will still have pain until all stones have passed

## 2017-12-27 HISTORY — PX: ACHILLES TENDON SURGERY: SHX542

## 2018-01-01 ENCOUNTER — Encounter: Payer: Self-pay | Admitting: Internal Medicine

## 2018-01-01 ENCOUNTER — Ambulatory Visit (INDEPENDENT_AMBULATORY_CARE_PROVIDER_SITE_OTHER): Payer: 59 | Admitting: Internal Medicine

## 2018-01-01 DIAGNOSIS — R109 Unspecified abdominal pain: Secondary | ICD-10-CM | POA: Diagnosis not present

## 2018-01-01 DIAGNOSIS — R3129 Other microscopic hematuria: Secondary | ICD-10-CM | POA: Diagnosis not present

## 2018-01-01 DIAGNOSIS — E559 Vitamin D deficiency, unspecified: Secondary | ICD-10-CM

## 2018-01-01 MED ORDER — KETOROLAC TROMETHAMINE 10 MG PO TABS: 10.0000 mg | ORAL_TABLET | Freq: Four times a day (QID) | ORAL | 0 refills | Status: DC | PRN

## 2018-01-01 NOTE — Assessment & Plan Note (Signed)
Resolved

## 2018-01-01 NOTE — Assessment & Plan Note (Signed)
On Vit D 

## 2018-01-01 NOTE — Progress Notes (Signed)
Subjective:  Patient ID: Latasha Hicks, female    DOB: [birth year]  Age: 57 y.o. MRN: [id number]  CC: No chief complaint on file.   HPI Latasha Hicks presents for kidney stones She passed stones yesterday. C/o constipation dure to opioids - stopped  L back pain is gone  Outpatient Medications Prior to Visit  Medication Sig Dispense Refill  . b complex vitamins tablet Take 1 tablet by mouth daily. 100 tablet 3  . Cholecalciferol (VITAMIN D3) 2000 UNITS capsule Take 1 capsule (2,000 Units total) by mouth daily. 100 capsule 3  . Cholecalciferol (VITAMIN D3) 50000 units CAPS TAKE ONE CAPSULE BY MOUTH WEEKLY AS ONE DOSE 8 capsule 0  . Ibuprofen (ADVIL PO) Take by mouth.    Marland Kitchen HYDROcodone-acetaminophen (NORCO/VICODIN) 5-325 MG tablet Take 1 tablet by mouth every 6 (six) hours as needed for moderate pain. (Patient not taking: Reported on 01/01/2018) 20 tablet 0  . ondansetron (ZOFRAN) 4 MG tablet Take 1 tablet (4 mg total) by mouth every 8 (eight) hours as needed for nausea or vomiting. (Patient not taking: Reported on 01/01/2018) 20 tablet 0  . tamsulosin (FLOMAX) 0.4 MG CAPS capsule Take 1 capsule (0.4 mg total) by mouth daily. (Patient not taking: Reported on 01/01/2018) 30 capsule 0   No facility-administered medications prior to visit.     ROS Review of Systems  Constitutional: Negative for activity change, appetite change, chills, fatigue and unexpected weight change.  HENT: Negative for congestion, mouth sores and sinus pressure.   Eyes: Negative for visual disturbance.  Respiratory: Negative for cough and chest tightness.   Gastrointestinal: Negative for abdominal pain and nausea.  Genitourinary: Negative for difficulty urinating, frequency and vaginal pain.  Musculoskeletal: Negative for back pain and gait problem.  Skin: Negative for pallor and rash.  Neurological: Negative for dizziness, tremors, weakness, numbness and headaches.  Psychiatric/Behavioral: Negative for  confusion and sleep disturbance.    Objective:  BP 128/82 (BP Location: Left Arm, Patient Position: Sitting, Cuff Size: Large)   Pulse (!) 51   Temp 98.8 F (37.1 C) (Oral)   Ht 5\' 6"  (1.676 m)   Wt 236 lb (107 kg)   SpO2 99%   BMI 38.09 kg/m   BP Readings from Last 3 Encounters:  01/01/18 128/82  12/24/17 136/88  08/24/17 128/88    Wt Readings from Last 3 Encounters:  01/01/18 236 lb (107 kg)  12/24/17 233 lb (105.7 kg)  12/09/17 227 lb (103 kg)    Physical Exam  Constitutional: She appears well-developed. No distress.  HENT:  Head: Normocephalic.  Right Ear: External ear normal.  Left Ear: External ear normal.  Nose: Nose normal.  Mouth/Throat: Oropharynx is clear and moist.  Eyes: Conjunctivae are normal. Pupils are equal, round, and reactive to light. Right eye exhibits no discharge. Left eye exhibits no discharge.  Neck: Normal range of motion. Neck supple. No JVD present. No tracheal deviation present. No thyromegaly present.  Cardiovascular: Normal rate, regular rhythm and normal heart sounds.  Pulmonary/Chest: No stridor. No respiratory distress. She has no wheezes.  Abdominal: Soft. Bowel sounds are normal. She exhibits no distension and no mass. There is no tenderness. There is no rebound and no guarding.  Musculoskeletal: She exhibits no edema or tenderness.  Lymphadenopathy:    She has no cervical adenopathy.  Neurological: She displays normal reflexes. No cranial nerve deficit. She exhibits normal muscle tone. Coordination normal.  Skin: No rash noted. No erythema.  Psychiatric: She has a  normal mood and affect. Her behavior is normal. Judgment and thought content normal.    Lab Results  Component Value Date   WBC 6.8 10/02/2016   HGB 14.8 10/02/2016   HCT 43.3 10/02/2016   PLT 282.0 10/02/2016   GLUCOSE 89 10/02/2016   CHOL 240 (H) 10/02/2016   TRIG 156.0 (H) 10/02/2016   HDL 51.60 10/02/2016   LDLDIRECT 181.4 05/01/2013   LDLCALC 157 (H)  10/02/2016   ALT 30 10/02/2016   AST 23 10/02/2016   NA 139 10/02/2016   K 3.9 10/02/2016   CL 106 10/02/2016   CREATININE 0.74 10/02/2016   BUN 13 10/02/2016   CO2 24 10/02/2016   TSH 1.91 10/02/2016   INR 1.0 07/27/2014   HGBA1C 6.0 02/06/2008    Ct Renal Stone Study  Result Date: 12/24/2017 CLINICAL DATA:  Left flank pain, microscopic hematuria, recent passage of right kidney stone EXAM: CT ABDOMEN AND PELVIS WITHOUT CONTRAST TECHNIQUE: Multidetector CT imaging of the abdomen and pelvis was performed following the standard protocol without IV contrast. COMPARISON:  06/21/2011 FINDINGS: Lower chest: Lung bases are clear. Hepatobiliary: Liver is within normal limits. Status post cholecystectomy. No intrahepatic or extrahepatic ductal dilatation. Pancreas: Within normal limits. Spleen: Within normal limits. Adrenals/Urinary Tract: Adrenal glands are within normal limits. Multiple bilateral renal sinus cysts, left greater than right. This obscures the ability to detect hydronephrosis on unenhanced CT. Two nonobstructing left lower pole renal calculi measuring up to 5 mm. Four stacked left proximal ureteral calculi measuring up to 4 mm (series 2/images 48-49). Bladder is decompressed. Stomach/Bowel: Stomach is notable for postsurgical changes related to lap band. No evidence of bowel obstruction. Normal appendix (series 2/image 53). Mild sigmoid diverticulosis, without evidence of diverticulitis. Vascular/Lymphatic: No evidence of abdominal aortic aneurysm. No suspicious abdominopelvic lymphadenopathy. Reproductive: Status post hysterectomy. Bilateral ovaries are within normal limits. Other: Small volume pelvic ascites. Musculoskeletal: Mild degenerative changes of the lower thoracic spine. IMPRESSION: Four stacked left proximal ureteral calculi measuring up to 4 mm. Two additional nonobstructing left lower pole renal calculi measuring up to 5 mm. Bilateral renal sinus cysts, left greater than right.  Additional ancillary findings as above. Electronically Signed   By: Julian Hy M.D.   On: 12/24/2017 10:06    Assessment & Plan:   There are no diagnoses linked to this encounter. I have discontinued Byanka I. Schoenbeck's HYDROcodone-acetaminophen, ondansetron, and tamsulosin. I am also having her maintain her b complex vitamins, Vitamin D3, Vitamin D3, and Ibuprofen (ADVIL PO).  No orders of the defined types were placed in this encounter.    Follow-up: No Follow-up on file.  Walker Kehr, MD

## 2018-01-01 NOTE — Assessment & Plan Note (Signed)
Due to kidney stones Toradol prn

## 2018-01-19 ENCOUNTER — Other Ambulatory Visit: Payer: Self-pay | Admitting: Internal Medicine

## 2018-01-21 DIAGNOSIS — M6702 Short Achilles tendon (acquired), left ankle: Secondary | ICD-10-CM | POA: Diagnosis not present

## 2018-01-28 ENCOUNTER — Inpatient Hospital Stay (INDEPENDENT_AMBULATORY_CARE_PROVIDER_SITE_OTHER): Payer: 59 | Admitting: Orthopedic Surgery

## 2018-01-29 ENCOUNTER — Inpatient Hospital Stay (INDEPENDENT_AMBULATORY_CARE_PROVIDER_SITE_OTHER): Payer: 59

## 2018-01-29 ENCOUNTER — Ambulatory Visit (INDEPENDENT_AMBULATORY_CARE_PROVIDER_SITE_OTHER): Payer: 59

## 2018-01-29 ENCOUNTER — Encounter (INDEPENDENT_AMBULATORY_CARE_PROVIDER_SITE_OTHER): Payer: Self-pay

## 2018-01-29 VITALS — Ht 66.0 in | Wt 236.0 lb

## 2018-01-29 DIAGNOSIS — M6702 Short Achilles tendon (acquired), left ankle: Secondary | ICD-10-CM

## 2018-01-29 NOTE — Progress Notes (Signed)
Patient is in the office today for a 1 week post op dressing change. Patient is s/p a left gastroc recession 01/21/18. She has the surgical dressing intact. The incision is well approximated and sutures are intact. There is no redness or swelling and the pt is full weight bearing without difficulty. Advised the pt that she may wash the leg with soap and water, pat dry and apply an ace bandage for compression. Patient voiced understanding and did not have any questions or concerns. Appointment has been made to follow up with Dr. Sharol Given next week and the pt is to call with any questions or changes in condition.   Autumn Forrest, [place], [employer]

## 2018-02-03 ENCOUNTER — Telehealth (INDEPENDENT_AMBULATORY_CARE_PROVIDER_SITE_OTHER): Payer: Self-pay

## 2018-02-03 NOTE — Telephone Encounter (Signed)
Patient's husband called wanting to let us know that patient had hit her left foot earlier today, but no swelling, no redness. Stated that patient is using ice and elevating left foot, also taking Tramadol.  Patient has an appt.with Dr. Sharol Given on Wednesday, 02/05/18.

## 2018-02-05 ENCOUNTER — Encounter (INDEPENDENT_AMBULATORY_CARE_PROVIDER_SITE_OTHER): Payer: Self-pay | Admitting: Orthopedic Surgery

## 2018-02-05 ENCOUNTER — Ambulatory Visit (INDEPENDENT_AMBULATORY_CARE_PROVIDER_SITE_OTHER): Payer: 59 | Admitting: Orthopedic Surgery

## 2018-02-05 VITALS — Ht 66.0 in | Wt 236.0 lb

## 2018-02-05 DIAGNOSIS — M6702 Short Achilles tendon (acquired), left ankle: Secondary | ICD-10-CM

## 2018-02-05 NOTE — Progress Notes (Signed)
Office Visit Note   Patient: Latasha Hicks           Date of Birth: 1961/03/17           MRN: [id number] Visit Date: 02/05/2018              Requested by: Cassandria Anger, MD [place], [place] [address] PCP: Cassandria Anger, MD  Chief Complaint  Patient presents with  . Left Leg - Routine Post Op    01/21/18 left gastroc recession       HPI: Patient is a 57 year old woman whose presents status post gastrocnemius recession on the left.  Patient states that she fell when she first went home and she states she stubbed her toe recently sustaining a stretching injury to the left calf.  She states that prior to stopping her toe her leg was asymptomatic.  Assessment & Plan: Visit Diagnoses:  1. Achilles tendon contracture, left     Plan: Patient was given instructions and demonstrated heel cord stretching the sutures are harvested she will start scar massage.  Follow-Up Instructions: Return in about 3 weeks (around 02/26/2018).   Ortho Exam  Patient is alert, oriented, no adenopathy, well-dressed, normal affect, normal respiratory effort. Examination the incision is well-healed there is no redness no cellulitis no drainage.  Patient does have pain with dorsiflexion of the ankle secondary to her recent blunt trauma.  The importance of stretching was discussed.  Imaging: No results found. No images are attached to the encounter.  Labs: Lab Results  Component Value Date   HGBA1C 6.0 02/06/2008   ESRSEDRATE 24 03/06/2007    @LABSALLVALUES (HGBA1)@  Body mass index is 38.09 kg/m.  Orders:  No orders of the defined types were placed in this encounter.  No orders of the defined types were placed in this encounter.    Procedures: No procedures performed  Clinical Data: No additional findings.  ROS:  All other systems negative, except as noted in the HPI. Review of Systems  Objective: Vital Signs: Ht 5\' 6"  (1.676 m)   Wt 236 lb (107 kg)    BMI 38.09 kg/m   Specialty Comments:  No specialty comments available.  PMFS History: Patient Active Problem List   Diagnosis Date Noted  . Hematuria 12/24/2017  . Left flank pain 12/24/2017  . Skin lesion 04/24/2017  . Symptomatic abdominal apron 04/15/2017  . H/O laparoscopic adjustable gastric banding 04/15/2017  . Well adult exam 10/02/2016  . Salivary gland infection 06/01/2016  . Paresthesia 06/10/2015  . Left leg swelling 07/26/2014  . Benign neoplasm of colon 09/04/2013  . Colonic ulcer 09/04/2013  . GI bleed 09/03/2013  . Urinary incontinence 08/21/2013  . Internal hemorrhoids 08/21/2013  . Unspecified constipation 06/12/2013  . LBP (low back pain) 06/12/2013  . History of colonic polyps 06/12/2013  . Right LBP 05/06/2013  . Perianal abscess 05/01/2013  . GRIEF REACTION 03/26/2008  . ALLERGIC RHINITIS 03/26/2008  . OSTEOARTHRITIS 03/26/2008  . Bariatric surgery status 03/26/2008  . Vitamin D deficiency 07/05/2007  . OBESITY, MORBID 07/05/2007  . Obstructive sleep apnea 07/05/2007  . VENOUS INSUFFICIENCY, CHRONIC 07/05/2007  . CHOLELITHIASIS 07/05/2007  . HYDRONEPHROSIS 07/05/2007   Past Medical History:  Diagnosis Date  . Allergic rhinitis   . Hemorrhoids   . Hydronephrosis   . Kidney stones   . Obstructive sleep apnea   . Osteoarthritis   . Perianal abscess 05/01/2013   6/14 7 o'clock   . Personal history of  colonic polyps    Question accuracy, patient report only.  . VENOUS INSUFFICIENCY, CHRONIC 07/05/2007   Annotation: LE Qualifier: Diagnosis of  By: Dance CMA ([place]), Kim    . Vitamin D deficiency     Family History  Problem Relation Age of Onset  . Diabetes Mother     Past Surgical History:  Procedure Laterality Date  . COLONOSCOPY N/A 09/04/2013   Procedure: COLONOSCOPY;  Surgeon: Inda Castle, MD;  Location: [facility];  Service: Endoscopy;  Laterality: N/A;  . COLONOSCOPY W/ POLYPECTOMY    . HIATAL HERNIA REPAIR    . LAPAROSCOPIC  CHOLECYSTECTOMY    . LAPAROSCOPIC GASTRIC BANDING    . VAGINAL HYSTERECTOMY     Social History   Occupational History  . Not on file  Tobacco Use  . Smoking status: Never Smoker  . Smokeless tobacco: Never Used  Substance and Sexual Activity  . Alcohol use: Yes    Alcohol/week: 1.8 oz    Types: 3 Glasses of wine per week  . Drug use: No  . Sexual activity: Yes

## 2018-02-11 ENCOUNTER — Encounter (HOSPITAL_COMMUNITY): Payer: Self-pay

## 2018-02-27 ENCOUNTER — Encounter (INDEPENDENT_AMBULATORY_CARE_PROVIDER_SITE_OTHER): Payer: Self-pay | Admitting: Orthopedic Surgery

## 2018-02-27 ENCOUNTER — Ambulatory Visit (INDEPENDENT_AMBULATORY_CARE_PROVIDER_SITE_OTHER): Payer: 59 | Admitting: Orthopedic Surgery

## 2018-02-27 VITALS — Ht 66.0 in | Wt 236.0 lb

## 2018-02-27 DIAGNOSIS — M6702 Short Achilles tendon (acquired), left ankle: Secondary | ICD-10-CM

## 2018-02-27 NOTE — Progress Notes (Signed)
Office Visit Note   Patient: Latasha Hicks           Date of Birth: 11-Jul-1961           MRN: [id number] Visit Date: 02/27/2018              Requested by: Cassandria Anger, MD [place], [place] [id number] PCP: Cassandria Anger, MD  Chief Complaint  Patient presents with  . Left Leg - Routine Post Op    01/21/18 left gastroc recession       HPI: Patient presents in follow-up status post gastrocnemius recession on the left.  Patient states she is doing well she states with increased activity with yard work she did have increased swelling.  Assessment & Plan: Visit Diagnoses:  1. Achilles tendon contracture, left     Plan: Patient is given a note to return to work on 03/10/2018.  She will continue to increase her activities as tolerated recommend Achilles stretching for a year recommended scar massage recommended knee-high compression stockings.  Follow-Up Instructions: Return if symptoms worsen or fail to improve.   Ortho Exam  Patient is alert, oriented, no adenopathy, well-dressed, normal affect, normal respiratory effort. Examination patient has a small amount of swelling in the left lower extremity she has dorsiflexion about 10 degrees past neutral she has a little bit of scarring around the surgical incision and scar massage was recommended.  There is no redness no cellulitis no tenderness to palpation.  Imaging: No results found. No images are attached to the encounter.  Labs: Lab Results  Component Value Date   HGBA1C 6.0 02/06/2008   ESRSEDRATE 24 03/06/2007    @LABSALLVALUES (HGBA1)@  Body mass index is 38.09 kg/m.  Orders:  No orders of the defined types were placed in this encounter.  No orders of the defined types were placed in this encounter.    Procedures: No procedures performed  Clinical Data: No additional findings.  ROS:  All other systems negative, except as noted in the HPI. Review of  Systems  Objective: Vital Signs: Ht 5\' 6"  (1.676 m)   Wt 236 lb (107 kg)   BMI 38.09 kg/m   Specialty Comments:  No specialty comments available.  PMFS History: Patient Active Problem List   Diagnosis Date Noted  . Hematuria 12/24/2017  . Left flank pain 12/24/2017  . Skin lesion 04/24/2017  . Symptomatic abdominal apron 04/15/2017  . H/O laparoscopic adjustable gastric banding 04/15/2017  . Well adult exam 10/02/2016  . Salivary gland infection 06/01/2016  . Paresthesia 06/10/2015  . Left leg swelling 07/26/2014  . Benign neoplasm of colon 09/04/2013  . Colonic ulcer 09/04/2013  . GI bleed 09/03/2013  . Urinary incontinence 08/21/2013  . Internal hemorrhoids 08/21/2013  . Unspecified constipation 06/12/2013  . LBP (low back pain) 06/12/2013  . History of colonic polyps 06/12/2013  . Right LBP 05/06/2013  . Perianal abscess 05/01/2013  . GRIEF REACTION 03/26/2008  . ALLERGIC RHINITIS 03/26/2008  . OSTEOARTHRITIS 03/26/2008  . Bariatric surgery status 03/26/2008  . Vitamin D deficiency 07/05/2007  . OBESITY, MORBID 07/05/2007  . Obstructive sleep apnea 07/05/2007  . VENOUS INSUFFICIENCY, CHRONIC 07/05/2007  . CHOLELITHIASIS 07/05/2007  . HYDRONEPHROSIS 07/05/2007   Past Medical History:  Diagnosis Date  . Allergic rhinitis   . Hemorrhoids   . Hydronephrosis   . Kidney stones   . Obstructive sleep apnea   . Osteoarthritis   . Perianal abscess 05/01/2013   6/14 7 o'clock   .  Personal history of colonic polyps    Question accuracy, patient report only.  . VENOUS INSUFFICIENCY, CHRONIC 07/05/2007   Annotation: LE Qualifier: Diagnosis of  By: Dance CMA ([place]), Kim    . Vitamin D deficiency     Family History  Problem Relation Age of Onset  . Diabetes Mother     Past Surgical History:  Procedure Laterality Date  . COLONOSCOPY N/A 09/04/2013   Procedure: COLONOSCOPY;  Surgeon: Inda Castle, MD;  Location: [facility];  Service: Endoscopy;  Laterality: N/A;   . COLONOSCOPY W/ POLYPECTOMY    . HIATAL HERNIA REPAIR    . LAPAROSCOPIC CHOLECYSTECTOMY    . LAPAROSCOPIC GASTRIC BANDING    . VAGINAL HYSTERECTOMY     Social History   Occupational History  . Not on file  Tobacco Use  . Smoking status: Never Smoker  . Smokeless tobacco: Never Used  Substance and Sexual Activity  . Alcohol use: Yes    Alcohol/week: 1.8 oz    Types: 3 Glasses of wine per week  . Drug use: No  . Sexual activity: Yes

## 2018-05-01 ENCOUNTER — Telehealth (INDEPENDENT_AMBULATORY_CARE_PROVIDER_SITE_OTHER): Payer: Self-pay

## 2018-05-01 ENCOUNTER — Ambulatory Visit (INDEPENDENT_AMBULATORY_CARE_PROVIDER_SITE_OTHER): Payer: 59 | Admitting: Orthopaedic Surgery

## 2018-05-01 ENCOUNTER — Ambulatory Visit (INDEPENDENT_AMBULATORY_CARE_PROVIDER_SITE_OTHER): Payer: 59

## 2018-05-01 ENCOUNTER — Encounter (INDEPENDENT_AMBULATORY_CARE_PROVIDER_SITE_OTHER): Payer: Self-pay | Admitting: Orthopaedic Surgery

## 2018-05-01 VITALS — Ht 65.0 in | Wt 228.0 lb

## 2018-05-01 DIAGNOSIS — M25562 Pain in left knee: Secondary | ICD-10-CM | POA: Diagnosis not present

## 2018-05-01 DIAGNOSIS — G8929 Other chronic pain: Secondary | ICD-10-CM | POA: Insufficient documentation

## 2018-05-01 DIAGNOSIS — M25561 Pain in right knee: Secondary | ICD-10-CM

## 2018-05-01 MED ORDER — BUPIVACAINE HCL 0.25 % IJ SOLN
2.0000 mL | INTRAMUSCULAR | Status: AC | PRN
  Administered 2018-05-01: 2 mL via INTRA_ARTICULAR

## 2018-05-01 MED ORDER — LIDOCAINE HCL 1 % IJ SOLN
2.0000 mL | INTRAMUSCULAR | Status: AC | PRN
  Administered 2018-05-01: 2 mL

## 2018-05-01 MED ORDER — METHYLPREDNISOLONE ACETATE 40 MG/ML IJ SUSP
40.0000 mg | INTRAMUSCULAR | Status: AC | PRN
  Administered 2018-05-01: 40 mg via INTRA_ARTICULAR

## 2018-05-01 NOTE — Progress Notes (Signed)
Office Visit Note   Patient: Latasha Hicks           Date of Birth: [birth year]           MRN: [id number] Visit Date: 05/01/2018              Requested by: Cassandria Anger, MD [place], [place] [id number] PCP: Cassandria Anger, MD   Assessment & Plan: Visit Diagnoses:  1. Left knee pain, unspecified chronicity     Plan: Impression is primary localized osteoarthritis to the left knee.  We will proceed with an intra-articular cortisone injection today.  We will send for approval for Visco supplementation injections.  In the meantime, she will think about timing of knee replacement.  She is planning on retiring at the end of the year and is thinking about doing this prior to.  She will follow-up with [place] once approved for Visco injection.  Follow-Up Instructions: Return if symptoms worsen or fail to improve.   Orders:  Orders Placed This Encounter  Procedures  . Large Joint Inj: L knee  . XR KNEE 3 VIEW LEFT   No orders of the defined types were placed in this encounter.     Procedures: Large Joint Inj: L knee on 05/01/2018 10:58 AM Indications: pain Details: 22 G needle, anterolateral approach Medications: 2 mL lidocaine 1 %; 2 mL bupivacaine 0.25 %; 40 mg methylPREDNISolone acetate 40 MG/ML      Clinical Data: No additional findings.   Subjective: Chief Complaint  Patient presents with  . Left Knee - Pain    HPI patient is a pleasant 57 year old female presents to our clinic today with recurrent left knee pain.  History of primary localized osteoarthritis.  This is been treated in the past with cortisone injections.  Her last one was several years ago and appears to have helped for about 6 months.  Her pain has returned.  The majority of her pain is to the posterior lateral aspect.  She describes this as a constant ache with associated instability.  She does also have night pain.  She has been taken tramadol and using Biofreeze with CBD oil with  minimal relief of symptoms.  Review of Systems as detailed in HPI.  All others reviewed and are negative.   Objective: Vital Signs: Ht 5\' 5"  (1.651 m)   Wt 228 lb (103.4 kg)   BMI 37.94 kg/m   Physical Exam well-developed well-nourished female no acute distress.  Alert and oriented x3.  Ortho Exam examination of the left knee reveals no effusion.  Range of motion 0 to 120 degrees.  Moderate patellofemoral crepitus.  Medial lateral joint line tenderness.  Specialty Comments:  No specialty comments available.  Imaging: Xr Knee 3 View Left  Result Date: 05/01/2018 Moderate tricompartmental degenerative changes    PMFS History: Patient Active Problem List   Diagnosis Date Noted  . Left knee pain 05/01/2018  . Hematuria 12/24/2017  . Left flank pain 12/24/2017  . Skin lesion 04/24/2017  . Symptomatic abdominal apron 04/15/2017  . H/O laparoscopic adjustable gastric banding 04/15/2017  . Well adult exam 10/02/2016  . Salivary gland infection 06/01/2016  . Paresthesia 06/10/2015  . Left leg swelling 07/26/2014  . Benign neoplasm of colon 09/04/2013  . Colonic ulcer 09/04/2013  . GI bleed 09/03/2013  . Urinary incontinence 08/21/2013  . Internal hemorrhoids 08/21/2013  . Unspecified constipation 06/12/2013  . LBP (low back pain) 06/12/2013  . History of colonic polyps  06/12/2013  . Right LBP 05/06/2013  . Perianal abscess 05/01/2013  . GRIEF REACTION 03/26/2008  . ALLERGIC RHINITIS 03/26/2008  . OSTEOARTHRITIS 03/26/2008  . Bariatric surgery status 03/26/2008  . Vitamin D deficiency 07/05/2007  . OBESITY, MORBID 07/05/2007  . Obstructive sleep apnea 07/05/2007  . VENOUS INSUFFICIENCY, CHRONIC 07/05/2007  . CHOLELITHIASIS 07/05/2007  . HYDRONEPHROSIS 07/05/2007   Past Medical History:  Diagnosis Date  . Allergic rhinitis   . Hemorrhoids   . Hydronephrosis   . Kidney stones   . Obstructive sleep apnea   . Osteoarthritis   . Perianal abscess 05/01/2013   6/14 7  o'clock   . Personal history of colonic polyps    Question accuracy, patient report only.  . VENOUS INSUFFICIENCY, CHRONIC 07/05/2007   Annotation: LE Qualifier: Diagnosis of  By: Dance CMA ([place]), Kim    . Vitamin D deficiency     Family History  Problem Relation Age of Onset  . Diabetes Mother     Past Surgical History:  Procedure Laterality Date  . COLONOSCOPY N/A 09/04/2013   Procedure: COLONOSCOPY;  Surgeon: Inda Castle, MD;  Location: [facility];  Service: Endoscopy;  Laterality: N/A;  . COLONOSCOPY W/ POLYPECTOMY    . HIATAL HERNIA REPAIR    . LAPAROSCOPIC CHOLECYSTECTOMY    . LAPAROSCOPIC GASTRIC BANDING    . VAGINAL HYSTERECTOMY     Social History   Occupational History  . Not on file  Tobacco Use  . Smoking status: Never Smoker  . Smokeless tobacco: Never Used  Substance and Sexual Activity  . Alcohol use: Yes    Alcohol/week: 1.8 oz    Types: 3 Glasses of wine per week  . Drug use: No  . Sexual activity: Yes

## 2018-05-01 NOTE — Telephone Encounter (Signed)
Please submit for gel injection- Dr Freddy Jaksch Knee.

## 2018-05-01 NOTE — Telephone Encounter (Signed)
Submitted application online for Monovisc injection, left knee. 

## 2018-05-01 NOTE — Telephone Encounter (Signed)
Noted  

## 2018-05-09 ENCOUNTER — Telehealth (INDEPENDENT_AMBULATORY_CARE_PROVIDER_SITE_OTHER): Payer: Self-pay

## 2018-05-09 NOTE — Telephone Encounter (Signed)
Submitted through portal for pharmacy under medical benefits.

## 2018-05-14 ENCOUNTER — Telehealth (INDEPENDENT_AMBULATORY_CARE_PROVIDER_SITE_OTHER): Payer: Self-pay

## 2018-05-14 NOTE — Telephone Encounter (Signed)
Talked with Jarrett Soho at [facility] and verified Rx for Monovisc, left knee.

## 2018-05-14 NOTE — Telephone Encounter (Signed)
Latasha Hicks received faxed rx for monovisc and said they can't accept an electronic signature and need verbal auth

## 2018-05-15 ENCOUNTER — Telehealth (INDEPENDENT_AMBULATORY_CARE_PROVIDER_SITE_OTHER): Payer: Self-pay | Admitting: Orthopaedic Surgery

## 2018-05-15 ENCOUNTER — Telehealth (INDEPENDENT_AMBULATORY_CARE_PROVIDER_SITE_OTHER): Payer: Self-pay

## 2018-05-15 NOTE — Telephone Encounter (Signed)
Noted with patient's paperwork.

## 2018-05-15 NOTE — Telephone Encounter (Signed)
[facility] called with authorization referral # [id number]

## 2018-05-15 NOTE — Telephone Encounter (Signed)
Faxed medication list and allergies to [facility] at [phone].

## 2018-05-20 ENCOUNTER — Encounter (INDEPENDENT_AMBULATORY_CARE_PROVIDER_SITE_OTHER): Payer: Self-pay | Admitting: Radiology

## 2018-05-20 NOTE — Progress Notes (Signed)
Received fax from [facility], asking for more info.  I have faxed requested info to them.  Ref #[id number].

## 2018-05-26 ENCOUNTER — Telehealth (INDEPENDENT_AMBULATORY_CARE_PROVIDER_SITE_OTHER): Payer: Self-pay

## 2018-05-26 NOTE — Telephone Encounter (Signed)
Called [facility] to check status of PA for Monovisc, left knee and was advised that injection was Denied through patient's pharmacy benefits and needs to be applied under patient's medical benefits per Jacqulynn Cadet, [employer] with [facility].  Submitted application online through [employer] portal for Monovisc, left knee under medical benefits.

## 2018-06-03 ENCOUNTER — Telehealth (INDEPENDENT_AMBULATORY_CARE_PROVIDER_SITE_OTHER): Payer: Self-pay

## 2018-06-03 NOTE — Telephone Encounter (Signed)
Talked with patient and advised her that she needed to call [facility] and give consent for gel injection.  Provided patient with phone number [phone].

## 2018-06-13 NOTE — Telephone Encounter (Addendum)
FYI  Pt called [facility] to give consent for gel injection

## 2018-06-13 NOTE — Telephone Encounter (Signed)
Noted. Thank You.

## 2018-06-18 NOTE — Telephone Encounter (Signed)
[facility] said they would deliver on 7/26. Should I call patient to schedule?

## 2018-06-18 NOTE — Telephone Encounter (Signed)
you're welcome

## 2018-06-18 NOTE — Telephone Encounter (Signed)
We can not schedule appointment until it is delivered to our office.  Thank you so much for asking.  I really do appreciate it.

## 2018-06-20 ENCOUNTER — Telehealth (INDEPENDENT_AMBULATORY_CARE_PROVIDER_SITE_OTHER): Payer: Self-pay

## 2018-06-20 ENCOUNTER — Telehealth (INDEPENDENT_AMBULATORY_CARE_PROVIDER_SITE_OTHER): Payer: Self-pay | Admitting: Radiology

## 2018-06-20 NOTE — Telephone Encounter (Signed)
Talked with [facility] and Monovisc injection will be delivered on Monday, 06/23/2018.

## 2018-06-20 NOTE — Telephone Encounter (Signed)
[facility] and arranged delivery for Monovisc.  It will be delivered Monday 06/23/18.  FYI.

## 2018-06-23 ENCOUNTER — Telehealth (INDEPENDENT_AMBULATORY_CARE_PROVIDER_SITE_OTHER): Payer: Self-pay

## 2018-06-23 NOTE — Telephone Encounter (Signed)
FYI  to you too

## 2018-06-23 NOTE — Telephone Encounter (Signed)
Noted. Thank You.

## 2018-06-23 NOTE — Telephone Encounter (Signed)
Called patient to let her know we have received injection from [facility] and we can NOW schedule appt for [facility] INJECTION LEFT KNEE.  NO answer LMOM to return call to make appt

## 2018-06-27 ENCOUNTER — Telehealth (INDEPENDENT_AMBULATORY_CARE_PROVIDER_SITE_OTHER): Payer: Self-pay

## 2018-06-27 NOTE — Telephone Encounter (Signed)
Patient approved for Monovisc, left knee. Purchased through [place] No PA required Per [facility]OB patient will be responsible for 15% OOP. Appt.scheduled for 07/08/2018.

## 2018-07-08 ENCOUNTER — Ambulatory Visit (INDEPENDENT_AMBULATORY_CARE_PROVIDER_SITE_OTHER): Payer: 59 | Admitting: Orthopaedic Surgery

## 2018-07-08 ENCOUNTER — Encounter (INDEPENDENT_AMBULATORY_CARE_PROVIDER_SITE_OTHER): Payer: Self-pay | Admitting: Orthopaedic Surgery

## 2018-07-08 DIAGNOSIS — M1712 Unilateral primary osteoarthritis, left knee: Secondary | ICD-10-CM | POA: Diagnosis not present

## 2018-07-08 MED ORDER — DICLOFENAC SODIUM 1 % TD GEL: 2.0000 g | Freq: Four times a day (QID) | TRANSDERMAL | 1 refills | Status: DC

## 2018-07-08 MED ORDER — HYALURONAN 88 MG/4ML IX SOSY
88.0000 mg | PREFILLED_SYRINGE | INTRA_ARTICULAR | Status: AC | PRN
  Administered 2018-07-08: 88 mg via INTRA_ARTICULAR

## 2018-07-08 NOTE — Progress Notes (Signed)
   Procedure Note  Patient: Latasha Hicks             Date of Birth: [birth year]           MRN: [id number]             Visit Date: 07/08/2018  Procedures: Visit Diagnoses: Primary localized osteoarthritis of left knee - Plan: Large Joint Inj: L knee  Large Joint Inj: L knee on 07/08/2018 9:27 AM Indications: pain Details: 22 G needle, anterolateral approach Medications: 88 mg Hyaluronan 88 MG/4ML

## 2018-07-09 ENCOUNTER — Ambulatory Visit: Payer: Self-pay | Admitting: *Deleted

## 2018-07-09 NOTE — Telephone Encounter (Signed)
Pt calling to report that she has diarrhea since about 7:30 this morning. Pt states she has had "too many episodes to count" of diarrhea that is loose and yellow in color. Pt reports that she has only had 15oz to drink today and has been able to eat yogurt and mashed potatoes. Pt reports that she has had nausea but no vomiting at this time. Pt that she has not been around anyone that has been sick recently. Pt states that she did have [place] food yesterday and left the food in the car for about an hour while riding around. Pt states that when she got home she did eat guacamole and also had a hamburger with mushrooms last night. Pt previously scheduled for appt on tomorrow. Pt given home care advice and advised if symptoms became worse before appt to seek treatment in the ED. Pt verbalized understanding.  Reason for Disposition . [1] SEVERE diarrhea (e.g., 7 or more times / day more than normal) AND [2] present > 24 hours (1 day)  Answer Assessment - Initial Assessment Questions 1. DIARRHEA SEVERITY: "How bad is the diarrhea?" "How many extra stools have you had in the past 24 hours than normal?"    - NO DIARRHEA (SCALE 0)   - MILD (SCALE 1-3): Few loose or mushy BMs; increase of 1-3 stools over normal daily number of stools; mild increase in ostomy output.   -  MODERATE (SCALE 4-7): Increase of 4-6 stools daily over normal; moderate increase in ostomy output. * SEVERE (SCALE 8-10; OR 'WORST POSSIBLE'): Increase of 7 or more stools daily over normal; moderate increase in ostomy output; incontinence.     Pt states too many to count 2. ONSET: "When did the diarrhea begin?"      This morning around 7am 3. BM CONSISTENCY: "How loose or watery is the diarrhea?"      loose 4. VOMITING: "Are you also vomiting?" If so, ask: "How many times in the past 24 hours?"      no 5. ABDOMINAL PAIN: "Are you having any abdominal pain?" If yes: "What does it feel like?" (e.g., crampy, dull, intermittent, constant)    Tender 3 not bad 6. ABDOMINAL PAIN SEVERITY: If present, ask: "How bad is the pain?"  (e.g., Scale 1-10; mild, moderate, or severe)   - MILD (1-3): doesn't interfere with normal activities, abdomen soft and not tender to touch    - MODERATE (4-7): interferes with normal activities or awakens from sleep, tender to touch    - SEVERE (8-10): excruciating pain, doubled over, unable to do any normal activities       No pain just stomach tender 7. ORAL INTAKE: If vomiting, "Have you been able to drink liquids?" "How much fluids have you had in the past 24 hours?"     Drunk about 15 oz 8. HYDRATION: "Any signs of dehydration?" (e.g., dry mouth [not just dry lips], too weak to stand, dizziness, new weight loss) "When did you last urinate?"     Staying in the bed so not dizzy 9. EXPOSURE: "Have you traveled to a foreign country recently?" "Have you been exposed to anyone with diarrhea?" "Could you have eaten any food that was spoiled?"     No has not been around anyone that's been sick. Pt states she did eat [place] food on yesterday and had a hamburger with mushrooms last night. Pt states she had the leftovers of the [place] food in the car for about an hour before taking it  out at home. Pt states that when she got home she did eat the guacamole 10. ANTIBIOTIC USE: "Are you taking antibiotics now or have you taken antibiotics in the past 2 months?"       no 11. OTHER SYMPTOMS: "Do you have any other symptoms?" (e.g., fever, blood in stool)       No blood in stool 12. PREGNANCY: "Is there any chance you are pregnant?" "When was your last menstrual period?"       No partial hysterectomy and lap band surgery  Protocols used: DIARRHEA-A-AH

## 2018-07-10 ENCOUNTER — Encounter: Payer: Self-pay | Admitting: Internal Medicine

## 2018-07-10 ENCOUNTER — Ambulatory Visit (INDEPENDENT_AMBULATORY_CARE_PROVIDER_SITE_OTHER): Payer: 59 | Admitting: Internal Medicine

## 2018-07-10 DIAGNOSIS — R197 Diarrhea, unspecified: Secondary | ICD-10-CM

## 2018-07-10 NOTE — Patient Instructions (Signed)
It is okay to use imdium if needed. Probiotics are okay to get you back to normal faster.    Bland Diet A bland diet consists of foods that do not have a lot of fat or fiber. Foods without fat or fiber are easier for the body to digest. They are also less likely to irritate your mouth, throat, stomach, and other parts of your gastrointestinal tract. A bland diet is sometimes called a BRAT diet. What is my plan? Your health care provider or dietitian may recommend specific changes to your diet to prevent and treat your symptoms, such as:  Eating small meals often.  Cooking food until it is soft enough to chew easily.  Chewing your food well.  Drinking fluids slowly.  Not eating foods that are very spicy, sour, or fatty.  Not eating citrus fruits, such as oranges and grapefruit.  What do I need to know about this diet?  Eat a variety of foods from the bland diet food list.  Do not follow a bland diet longer than you have to.  Ask your health care provider whether you should take vitamins. What foods can I eat? Grains  Hot cereals, such as cream of wheat. Bread, crackers, or tortillas made from refined white flour. Rice. Vegetables Canned or cooked vegetables. Mashed or boiled potatoes. Fruits Bananas. Applesauce. Other types of cooked or canned fruit with the skin and seeds removed, such as canned peaches or pears. Meats and Other Protein Sources Scrambled eggs. Creamy peanut butter or other nut butters. Lean, well-cooked meats, such as chicken or fish. Tofu. Soups or broths. Dairy Low-fat dairy products, such as milk, cottage cheese, or yogurt. Beverages Water. Herbal tea. Apple juice. Sweets and Desserts Pudding. Custard. Fruit gelatin. Ice cream. Fats and Oils Mild salad dressings. Canola or olive oil. The items listed above may not be a complete list of allowed foods or beverages. Contact your dietitian for more options. What foods are not recommended? Foods and  ingredients that are often not recommended include:  Spicy foods, such as hot sauce or salsa.  Fried foods.  Sour foods, such as pickled or fermented foods.  Raw vegetables or fruits, especially citrus or berries.  Caffeinated drinks.  Alcohol.  Strongly flavored seasonings or condiments.  The items listed above may not be a complete list of foods and beverages that are not allowed. Contact your dietitian for more information. This information is not intended to replace advice given to you by your health care provider. Make sure you discuss any questions you have with your health care provider. Document Released: 03/05/2016 Document Revised: 04/19/2016 Document Reviewed: 11/24/2014 Elsevier Interactive Patient Education  2018 [employer].

## 2018-07-10 NOTE — Progress Notes (Signed)
   Subjective:    Patient ID: Latasha Hicks, female    DOB: 11-29-1960, 57 y.o.   MRN: [id number]  HPI The patient is a 57 YO female coming in for diarrhea. She denies vomiting. Mild nausea. Started yesterday morning. Ate [place] food which had been sitting out for several hours. Diarrhea initially too numerous to count. Today is gone. She has tried imodium which helped relieve symptoms. Slept last night without waking for diarrhea. Overall it is almost gone. Denies blood in diarrhea. Has been drinking some fluids. Eating some mashed potatoes yesterday. Minimal food. Down 4-5 pounds in the last day. Does have mild weakness.   Review of Systems  Constitutional: Negative.   HENT: Negative.   Eyes: Negative.   Respiratory: Negative for cough, chest tightness and shortness of breath.   Cardiovascular: Negative for chest pain, palpitations and leg swelling.  Gastrointestinal: Positive for abdominal pain, diarrhea and nausea. Negative for abdominal distention, constipation and vomiting.  Musculoskeletal: Negative.   Skin: Negative.   Neurological: Negative.   Psychiatric/Behavioral: Negative.       Objective:   Physical Exam  Constitutional: She is oriented to person, place, and time. She appears well-developed and well-nourished.  HENT:  Head: Normocephalic and atraumatic.  Eyes: EOM are normal.  Neck: Normal range of motion.  Cardiovascular: Normal rate and regular rhythm.  Pulmonary/Chest: Effort normal and breath sounds normal. No respiratory distress. She has no wheezes. She has no rales.  Abdominal: Soft. Bowel sounds are normal. She exhibits no distension. There is no tenderness. There is no rebound.  Minimal soreness to palpation, no guarding or rebound  Musculoskeletal: She exhibits no edema.  Neurological: She is alert and oriented to person, place, and time. Coordination normal.  Skin: Skin is warm and dry.   Vitals:   07/10/18 0956  BP: 110/70  Pulse: (!) 58  Temp:  98.3 F (36.8 C)  TempSrc: Oral  SpO2: 98%  Weight: 221 lb (100.2 kg)  Height: 5\' 5"  (1.651 m)      Assessment & Plan:

## 2018-07-10 NOTE — Assessment & Plan Note (Signed)
Likely food bourne from sitting out too long prior to ingestion. Can use imdium prn. Advised to push fluids. Given BRAT diet to help re-introduce foods. Call back if persistent or recurrent.

## 2018-08-28 ENCOUNTER — Encounter: Payer: Self-pay | Admitting: Internal Medicine

## 2018-08-28 ENCOUNTER — Ambulatory Visit (INDEPENDENT_AMBULATORY_CARE_PROVIDER_SITE_OTHER): Payer: 59 | Admitting: Internal Medicine

## 2018-08-28 VITALS — BP 114/72 | HR 53 | Temp 98.6°F | Ht 65.0 in | Wt 223.0 lb

## 2018-08-28 DIAGNOSIS — E559 Vitamin D deficiency, unspecified: Secondary | ICD-10-CM | POA: Diagnosis not present

## 2018-08-28 DIAGNOSIS — K635 Polyp of colon: Secondary | ICD-10-CM | POA: Diagnosis not present

## 2018-08-28 DIAGNOSIS — Z1239 Encounter for other screening for malignant neoplasm of breast: Secondary | ICD-10-CM

## 2018-08-28 DIAGNOSIS — Z0000 Encounter for general adult medical examination without abnormal findings: Secondary | ICD-10-CM | POA: Diagnosis not present

## 2018-08-28 DIAGNOSIS — Z780 Asymptomatic menopausal state: Secondary | ICD-10-CM

## 2018-08-28 NOTE — Assessment & Plan Note (Signed)
Dr Carlean Purl Colon 2014 w/polypectomy complicated by bleeding F/u appt is due

## 2018-08-28 NOTE — Patient Instructions (Signed)
Sign up for  Digital library ( via Libby app on your phone or your ipad). If you don't have a library card  - go to any library branch. They will set you up in 15 minutes. It is free. You can check out books to read and to listen, check out magazines and newspapers, movies etc.   

## 2018-08-28 NOTE — Progress Notes (Signed)
Subjective:  Patient ID: Latasha Hicks, female    DOB: 1961-10-03  Age: 57 y.o. MRN: [id number]  CC: No chief complaint on file.   HPI Latasha Hicks presents for a well exam  Outpatient Medications Prior to Visit  Medication Sig Dispense Refill  . b complex vitamins tablet Take 1 tablet by mouth daily. 100 tablet 3  . Cholecalciferol (VITAMIN D3) 2000 UNITS capsule Take 1 capsule (2,000 Units total) by mouth daily. 100 capsule 3  . diclofenac sodium (VOLTAREN) 1 % GEL Apply 2 g topically 4 (four) times daily. 1 Tube 1  . Ibuprofen (ADVIL PO) Take by mouth.    Marland Kitchen ketorolac (TORADOL) 10 MG tablet Take 1 tablet (10 mg total) by mouth every 6 (six) hours as needed. For renal colic 20 tablet 0   No facility-administered medications prior to visit.     ROS: Review of Systems  Constitutional: Negative for activity change, appetite change, chills, fatigue and unexpected weight change.  HENT: Negative for congestion, mouth sores and sinus pressure.   Eyes: Negative for visual disturbance.  Respiratory: Negative for cough and chest tightness.   Gastrointestinal: Negative for abdominal pain and nausea.  Genitourinary: Negative for difficulty urinating, frequency and vaginal pain.  Musculoskeletal: Negative for back pain and gait problem.  Skin: Negative for pallor and rash.  Neurological: Negative for dizziness, tremors, weakness, numbness and headaches.  Psychiatric/Behavioral: Negative for confusion, sleep disturbance and suicidal ideas.    Objective:  BP 114/72 (BP Location: Left Arm, Patient Position: Sitting, Cuff Size: Normal)   Pulse (!) 53   Temp 98.6 F (37 C) (Oral)   Ht 5\' 5"  (1.651 m)   Wt 223 lb (101.2 kg)   SpO2 98%   BMI 37.11 kg/m   BP Readings from Last 3 Encounters:  08/28/18 114/72  07/10/18 110/70  01/01/18 128/82    Wt Readings from Last 3 Encounters:  08/28/18 223 lb (101.2 kg)  07/10/18 221 lb (100.2 kg)  05/01/18 228 lb (103.4 kg)     Physical Exam  Constitutional: She appears well-developed. No distress.  HENT:  Head: Normocephalic.  Right Ear: External ear normal.  Left Ear: External ear normal.  Nose: Nose normal.  Mouth/Throat: Oropharynx is clear and moist.  Eyes: Pupils are equal, round, and reactive to light. Conjunctivae are normal. Right eye exhibits no discharge. Left eye exhibits no discharge.  Neck: Normal range of motion. Neck supple. No JVD present. No tracheal deviation present. No thyromegaly present.  Cardiovascular: Normal rate, regular rhythm and normal heart sounds.  Pulmonary/Chest: No stridor. No respiratory distress. She has no wheezes.  Abdominal: Soft. Bowel sounds are normal. She exhibits no distension and no mass. There is no tenderness. There is no rebound and no guarding.  Musculoskeletal: She exhibits no edema or tenderness.  Lymphadenopathy:    She has no cervical adenopathy.  Neurological: She displays normal reflexes. No cranial nerve deficit. She exhibits normal muscle tone. Coordination normal.  Skin: No rash noted. No erythema.  Psychiatric: She has a normal mood and affect. Her behavior is normal. Judgment and thought content normal.  obese  Lab Results  Component Value Date   WBC 6.8 10/02/2016   HGB 14.8 10/02/2016   HCT 43.3 10/02/2016   PLT 282.0 10/02/2016   GLUCOSE 89 10/02/2016   CHOL 240 (H) 10/02/2016   TRIG 156.0 (H) 10/02/2016   HDL 51.60 10/02/2016   LDLDIRECT 181.4 05/01/2013   LDLCALC 157 (H) 10/02/2016   ALT  30 10/02/2016   AST 23 10/02/2016   NA 139 10/02/2016   K 3.9 10/02/2016   CL 106 10/02/2016   CREATININE 0.74 10/02/2016   BUN 13 10/02/2016   CO2 24 10/02/2016   TSH 1.91 10/02/2016   INR 1.0 07/27/2014   HGBA1C 6.0 02/06/2008    Ct Renal Stone Study  Result Date: 12/24/2017 CLINICAL DATA:  Left flank pain, microscopic hematuria, recent passage of right kidney stone EXAM: CT ABDOMEN AND PELVIS WITHOUT CONTRAST TECHNIQUE: Multidetector  CT imaging of the abdomen and pelvis was performed following the standard protocol without IV contrast. COMPARISON:  06/21/2011 FINDINGS: Lower chest: Lung bases are clear. Hepatobiliary: Liver is within normal limits. Status post cholecystectomy. No intrahepatic or extrahepatic ductal dilatation. Pancreas: Within normal limits. Spleen: Within normal limits. Adrenals/Urinary Tract: Adrenal glands are within normal limits. Multiple bilateral renal sinus cysts, left greater than right. This obscures the ability to detect hydronephrosis on unenhanced CT. Two nonobstructing left lower pole renal calculi measuring up to 5 mm. Four stacked left proximal ureteral calculi measuring up to 4 mm (series 2/images 48-49). Bladder is decompressed. Stomach/Bowel: Stomach is notable for postsurgical changes related to lap band. No evidence of bowel obstruction. Normal appendix (series 2/image 53). Mild sigmoid diverticulosis, without evidence of diverticulitis. Vascular/Lymphatic: No evidence of abdominal aortic aneurysm. No suspicious abdominopelvic lymphadenopathy. Reproductive: Status post hysterectomy. Bilateral ovaries are within normal limits. Other: Small volume pelvic ascites. Musculoskeletal: Mild degenerative changes of the lower thoracic spine. IMPRESSION: Four stacked left proximal ureteral calculi measuring up to 4 mm. Two additional nonobstructing left lower pole renal calculi measuring up to 5 mm. Bilateral renal sinus cysts, left greater than right. Additional ancillary findings as above. Electronically Signed   By: Julian Hy M.D.   On: 12/24/2017 10:06    Assessment & Plan:   There are no diagnoses linked to this encounter.   No orders of the defined types were placed in this encounter.    Follow-up: No follow-ups on file.  Walker Kehr, MD

## 2018-08-28 NOTE — Assessment & Plan Note (Signed)
Vit D 

## 2018-08-29 ENCOUNTER — Telehealth: Payer: Self-pay | Admitting: Obstetrics and Gynecology

## 2018-08-29 ENCOUNTER — Other Ambulatory Visit (INDEPENDENT_AMBULATORY_CARE_PROVIDER_SITE_OTHER): Payer: 59

## 2018-08-29 DIAGNOSIS — K635 Polyp of colon: Secondary | ICD-10-CM

## 2018-08-29 DIAGNOSIS — Z0000 Encounter for general adult medical examination without abnormal findings: Secondary | ICD-10-CM | POA: Diagnosis not present

## 2018-08-29 DIAGNOSIS — E559 Vitamin D deficiency, unspecified: Secondary | ICD-10-CM

## 2018-08-29 LAB — LIPID PANEL
CHOLESTEROL: 243 mg/dL — AB (ref 0–200)
HDL: 44.4 mg/dL (ref >39.00)
LDL CALC: 176 mg/dL — AB (ref 0–99)
NonHDL: 198.69
Total CHOL/HDL Ratio: 5
Triglycerides: 114 mg/dL (ref 0.0–149.0)
VLDL: 22.8 mg/dL (ref 0.0–40.0)

## 2018-08-29 LAB — CBC WITH DIFFERENTIAL/PLATELET
BASOS PCT: 0.3 % (ref 0.0–3.0)
Basophils Absolute: 0 10*3/uL (ref 0.0–0.1)
EOS ABS: 0.1 10*3/uL (ref 0.0–0.7)
Eosinophils Relative: 0.8 % (ref 0.0–5.0)
HCT: 43.5 % (ref 36.0–46.0)
Hemoglobin: 14.7 g/dL (ref 12.0–15.0)
LYMPHS ABS: 1.3 10*3/uL (ref 0.7–4.0)
Lymphocytes Relative: 15.1 % (ref 12.0–46.0)
MCHC: 33.9 g/dL (ref 30.0–36.0)
MCV: 92 fl (ref 78.0–100.0)
Monocytes Absolute: 0.6 10*3/uL (ref 0.1–1.0)
Monocytes Relative: 6.9 % (ref 3.0–12.0)
NEUTROS ABS: 6.7 10*3/uL (ref 1.4–7.7)
Neutrophils Relative %: 76.9 % (ref 43.0–77.0)
PLATELETS: 230 10*3/uL (ref 150.0–400.0)
RBC: 4.73 Mil/uL (ref 3.87–5.11)
RDW: 14 % (ref 11.5–15.5)
WBC: 8.7 10*3/uL (ref 4.0–10.5)

## 2018-08-29 LAB — URINALYSIS, ROUTINE W REFLEX MICROSCOPIC
BILIRUBIN URINE: NEGATIVE
Ketones, ur: NEGATIVE
Leukocytes, UA: NEGATIVE
Nitrite: NEGATIVE
PH: 6 (ref 5.0–8.0)
SPECIFIC GRAVITY, URINE: 1.025 (ref 1.000–1.030)
Total Protein, Urine: NEGATIVE
Urine Glucose: NEGATIVE
Urobilinogen, UA: 0.2 (ref 0.0–1.0)

## 2018-08-29 LAB — HEPATIC FUNCTION PANEL
ALT: 18 U/L (ref 0–35)
AST: 20 U/L (ref 0–37)
Albumin: 3.8 g/dL (ref 3.5–5.2)
Alkaline Phosphatase: 58 U/L (ref 39–117)
BILIRUBIN DIRECT: 0.1 mg/dL (ref 0.0–0.3)
Total Bilirubin: 0.6 mg/dL (ref 0.2–1.2)
Total Protein: 6.7 g/dL (ref 6.0–8.3)

## 2018-08-29 LAB — TSH: TSH: 1.51 u[IU]/mL (ref 0.35–4.50)

## 2018-08-29 LAB — BASIC METABOLIC PANEL
BUN: 19 mg/dL (ref 6–23)
CHLORIDE: 107 meq/L (ref 96–112)
CO2: 26 meq/L (ref 19–32)
CREATININE: 0.85 mg/dL (ref 0.40–1.20)
Calcium: 9.1 mg/dL (ref 8.4–10.5)
GFR: 73.59 mL/min (ref >60.00)
Glucose, Bld: 95 mg/dL (ref 70–99)
Potassium: 4.1 mEq/L (ref 3.5–5.1)
Sodium: 139 mEq/L (ref 135–145)

## 2018-08-29 LAB — VITAMIN D 25 HYDROXY (VIT D DEFICIENCY, FRACTURES): VITD: 22.96 ng/mL — AB (ref 30.00–100.00)

## 2018-08-29 LAB — VITAMIN B12: VITAMIN B 12: 419 pg/mL (ref 211–911)

## 2018-08-29 NOTE — Telephone Encounter (Signed)
Called and left a message for patient to call back to schedule a new patient doctor referral appointment with our office to see Dr. Talbert Nan for: wellness and menopause.

## 2018-08-31 ENCOUNTER — Other Ambulatory Visit: Payer: Self-pay | Admitting: Internal Medicine

## 2018-08-31 MED ORDER — VITAMIN D3 1.25 MG (50000 UT) PO CAPS: 1.0000 | ORAL_CAPSULE | ORAL | 0 refills | Status: DC

## 2018-09-02 ENCOUNTER — Encounter (INDEPENDENT_AMBULATORY_CARE_PROVIDER_SITE_OTHER): Payer: Self-pay | Admitting: Orthopaedic Surgery

## 2018-09-02 ENCOUNTER — Ambulatory Visit (INDEPENDENT_AMBULATORY_CARE_PROVIDER_SITE_OTHER): Payer: Self-pay

## 2018-09-02 ENCOUNTER — Ambulatory Visit (INDEPENDENT_AMBULATORY_CARE_PROVIDER_SITE_OTHER): Payer: 59 | Admitting: Orthopaedic Surgery

## 2018-09-02 ENCOUNTER — Other Ambulatory Visit (INDEPENDENT_AMBULATORY_CARE_PROVIDER_SITE_OTHER): Payer: Self-pay | Admitting: Orthopaedic Surgery

## 2018-09-02 DIAGNOSIS — M1712 Unilateral primary osteoarthritis, left knee: Secondary | ICD-10-CM

## 2018-09-02 DIAGNOSIS — M25572 Pain in left ankle and joints of left foot: Secondary | ICD-10-CM

## 2018-09-02 NOTE — Progress Notes (Signed)
Office Visit Note   Patient: Latasha Hicks           Date of Birth: 1960/11/30           MRN: [id number] Visit Date: 09/02/2018              Requested by: Cassandria Anger, MD [place], [place] [address] PCP: Cassandria Anger, MD   Assessment & Plan: Visit Diagnoses:  1. Pain in left ankle and joints of left foot   2. Unilateral primary osteoarthritis, left knee     Plan: In terms of her knee arthritis she has exhausted conservative treatment and at this point wishes to proceed with a left total knee replacement.  We discussed risks and benefits and postoperative rehab and recovery.  In terms of her left ankle she has had 2 cortisone injections with minimal relief.  I think she has has issues with posterior tibial tendon dysfunction and possibly ankle arthritis.  Recommend MRI to fully evaluate for structural abnormalities.  Follow-Up Instructions: Return if symptoms worsen or fail to improve.   Orders:  No orders of the defined types were placed in this encounter.  No orders of the defined types were placed in this encounter.     Procedures: No procedures performed   Clinical Data: No additional findings.   Subjective: Chief Complaint  Patient presents with  . Left Knee - Pain  . Left Ankle - Pain  . Right Hip - Pain    Latasha Hicks comes in today for 2 separate issues.  One is her knee degenerative joint disease.  She states that she has not gotten significant pain relief from cortisone injections and now wishes to proceed with a total knee replacement.  In terms of her left ankle this continues to bother her mainly on the posterior medial side.  She has had a gastroc recession by Dr. Sharol Given which failed to improve her pain.  The pain is worse with prolonged standing and walking.   Review of Systems  Constitutional: Negative.   HENT: Negative.   Eyes: Negative.   Respiratory: Negative.   Cardiovascular: Negative.   Endocrine: Negative.    Musculoskeletal: Negative.   Neurological: Negative.   Hematological: Negative.   Psychiatric/Behavioral: Negative.   All other systems reviewed and are negative.    Objective: Vital Signs: There were no vitals taken for this visit.  Physical Exam  Constitutional: She is oriented to person, place, and time. She appears well-developed and well-nourished.  Pulmonary/Chest: Effort normal.  Neurological: She is alert and oriented to person, place, and time.  Skin: Skin is warm. Capillary refill takes less than 2 seconds.  Psychiatric: She has a normal mood and affect. Her behavior is normal. Judgment and thought content normal.  Nursing note and vitals reviewed.   Ortho Exam Left knee exam shows no joint effusion.  Collaterals and cruciates are stable. Left ankle exam shows mainly tenderness along the posterior tibial tendon.  She does have a flexible flatfoot.  She has trouble doing a single heel raise.  Mild hindfoot valgus. Specialty Comments:  No specialty comments available.  Imaging: Xr Ankle Complete Left  Result Date: 09/02/2018 Mild periarticular spurring of the medial and lateral gutter    PMFS History: Patient Active Problem List   Diagnosis Date Noted  . Colon polyp 08/28/2018  . Diarrhea 07/10/2018  . Left knee pain 05/01/2018  . Hematuria 12/24/2017  . Left flank pain 12/24/2017  . Skin lesion  04/24/2017  . Symptomatic abdominal apron 04/15/2017  . H/O laparoscopic adjustable gastric banding 04/15/2017  . Well adult exam 10/02/2016  . Salivary gland infection 06/01/2016  . Paresthesia 06/10/2015  . Left leg swelling 07/26/2014  . Benign neoplasm of colon 09/04/2013  . Colonic ulcer 09/04/2013  . GI bleed 09/03/2013  . Urinary incontinence 08/21/2013  . Internal hemorrhoids 08/21/2013  . Unspecified constipation 06/12/2013  . LBP (low back pain) 06/12/2013  . History of colonic polyps 06/12/2013  . Right LBP 05/06/2013  . Perianal abscess 05/01/2013   . GRIEF REACTION 03/26/2008  . ALLERGIC RHINITIS 03/26/2008  . OSTEOARTHRITIS 03/26/2008  . Bariatric surgery status 03/26/2008  . Vitamin D deficiency 07/05/2007  . OBESITY, MORBID 07/05/2007  . Obstructive sleep apnea 07/05/2007  . VENOUS INSUFFICIENCY, CHRONIC 07/05/2007  . CHOLELITHIASIS 07/05/2007  . HYDRONEPHROSIS 07/05/2007   Past Medical History:  Diagnosis Date  . Allergic rhinitis   . Hemorrhoids   . Hydronephrosis   . Kidney stones   . Obstructive sleep apnea   . Osteoarthritis   . Perianal abscess 05/01/2013   6/14 7 o'clock   . Personal history of colonic polyps    Question accuracy, patient report only.  . VENOUS INSUFFICIENCY, CHRONIC 07/05/2007   Annotation: LE Qualifier: Diagnosis of  By: Dance CMA ([place]), Kim    . Vitamin D deficiency     Family History  Problem Relation Age of Onset  . Diabetes Mother     Past Surgical History:  Procedure Laterality Date  . ACHILLES TENDON SURGERY Left 12/2017  . COLONOSCOPY N/A 09/04/2013   Procedure: COLONOSCOPY;  Surgeon: Inda Castle, MD;  Location: [facility];  Service: Endoscopy;  Laterality: N/A;  . COLONOSCOPY W/ POLYPECTOMY    . HIATAL HERNIA REPAIR    . LAPAROSCOPIC CHOLECYSTECTOMY    . LAPAROSCOPIC GASTRIC BANDING    . VAGINAL HYSTERECTOMY     Social History   Occupational History  . Not on file  Tobacco Use  . Smoking status: Never Smoker  . Smokeless tobacco: Never Used  Substance and Sexual Activity  . Alcohol use: Yes    Alcohol/week: 3.0 standard drinks    Types: 3 Glasses of wine per week  . Drug use: No  . Sexual activity: Yes

## 2018-09-02 NOTE — Addendum Note (Signed)
Addended by: Precious Bard on: 09/02/2018 02:56 PM   Modules accepted: Orders

## 2018-09-16 ENCOUNTER — Ambulatory Visit
Admission: RE | Admit: 2018-09-16 | Discharge: 2018-09-16 | Disposition: A | Discharge status: Home / Self Care | Payer: 59 | Source: Ambulatory Visit | Attending: Orthopaedic Surgery | Admitting: Orthopaedic Surgery

## 2018-09-16 DIAGNOSIS — M25572 Pain in left ankle and joints of left foot: Secondary | ICD-10-CM

## 2018-09-18 NOTE — Pre-Procedure Instructions (Signed)
Latasha Hicks  09/18/2018      [id number] [address], [place] [address] Phone: [phone] Fax: [phone]    Your procedure is scheduled on Mon., Nov. 4, 2019 from 12:32PM-2:33PM  Report to [facility] Admitting Entrance "A" at 10:30AM  Call this number if you have problems the morning of surgery:  [phone]   Remember:  Do not eat or drink after midnight on Nov. 3rd    Take these medicines the morning of surgery with A SIP OF WATER: NONE  7 days before surgery (09/22/18), stop taking all Other Aspirin Products, Vitamins, Fish oils, and Herbal medications. Also stop all NSAIDS i.e. Advil, Ibuprofen, Motrin, Aleve, Anaprox, Naproxen, BC, Goody Powders, and all Supplements.   Do not wear jewelry, make-up or nail polish.  Do not wear lotions, powders, or perfumes, or deodorant.  Do not shave 48 hours prior to surgery.    Do not bring valuables to the hospital.  [facility] is not responsible for any belongings or valuables.  Contacts, dentures or bridgework may not be worn into surgery.  Leave your suitcase in the car.  After surgery it may be brought to your room.  For patients admitted to the hospital, discharge time will be determined by your treatment team.  Patients discharged the day of surgery will not be allowed to drive home.   Special instructions:  [facility]- Preparing For Surgery  Before surgery, you can play an important role. Because skin is not sterile, your skin needs to be as free of germs as possible. You can reduce the number of germs on your skin by washing with CHG (chlorahexidine gluconate) Soap before surgery.  CHG is an antiseptic cleaner which kills germs and bonds with the skin to continue killing germs even after washing.    Oral Hygiene is also important to reduce your risk of infection.  Remember - BRUSH YOUR TEETH THE MORNING OF SURGERY WITH  YOUR REGULAR TOOTHPASTE  Please do not use if you have an allergy to CHG or antibacterial soaps. If your skin becomes reddened/irritated stop using the CHG.  Do not shave (including legs and underarms) for at least 48 hours prior to first CHG shower. It is OK to shave your face.  Please follow these instructions carefully.   1. Shower the NIGHT BEFORE SURGERY and the MORNING OF SURGERY with CHG.   2. If you chose to wash your hair, wash your hair first as usual with your normal shampoo.  3. After you shampoo, rinse your hair and body thoroughly to remove the shampoo.  4. Use CHG as you would any other liquid soap. You can apply CHG directly to the skin and wash gently with a scrungie or a clean washcloth.   5. Apply the CHG Soap to your body ONLY FROM THE NECK DOWN.  Do not use on open wounds or open sores. Avoid contact with your eyes, ears, mouth and genitals (private parts). Wash Face and genitals (private parts)  with your normal soap.  6. Wash thoroughly, paying special attention to the area where your surgery will be performed.  7. Thoroughly rinse your body with warm water from the neck down.  8. DO NOT shower/wash with your normal soap after using and rinsing off the CHG Soap.  9. Pat yourself dry with a CLEAN TOWEL.  10. Wear CLEAN PAJAMAS to bed the night before surgery,  wear comfortable clothes the morning of surgery  11. Place CLEAN SHEETS on your bed the night of your first shower and DO NOT SLEEP WITH PETS.  Day of Surgery:  Do not apply any deodorants/lotions.  Please wear clean clothes to the hospital/surgery center.   Remember to brush your teeth WITH YOUR REGULAR TOOTHPASTE.  Please read over the following fact sheets that you were given. Pain Booklet, Coughing and Deep Breathing, MRSA Information and Surgical Site Infection Prevention

## 2018-09-19 ENCOUNTER — Ambulatory Visit (HOSPITAL_COMMUNITY)
Admission: RE | Admit: 2018-09-19 | Discharge: 2018-09-19 | Disposition: A | Discharge status: Home / Self Care | Payer: 59 | Source: Ambulatory Visit | Attending: Physician Assistant | Admitting: Physician Assistant

## 2018-09-19 ENCOUNTER — Encounter (HOSPITAL_COMMUNITY)
Admission: RE | Admit: 2018-09-19 | Discharge: 2018-09-19 | Disposition: A | Discharge status: Home / Self Care | Payer: 59 | Source: Ambulatory Visit | Attending: Orthopaedic Surgery | Admitting: Orthopaedic Surgery

## 2018-09-19 ENCOUNTER — Encounter (HOSPITAL_COMMUNITY): Payer: Self-pay

## 2018-09-19 ENCOUNTER — Other Ambulatory Visit: Payer: Self-pay

## 2018-09-19 DIAGNOSIS — R001 Bradycardia, unspecified: Secondary | ICD-10-CM | POA: Diagnosis not present

## 2018-09-19 DIAGNOSIS — M1712 Unilateral primary osteoarthritis, left knee: Secondary | ICD-10-CM

## 2018-09-19 DIAGNOSIS — Z01818 Encounter for other preprocedural examination: Secondary | ICD-10-CM | POA: Diagnosis not present

## 2018-09-19 HISTORY — DX: Personal history of urinary calculi: Z87.442

## 2018-09-19 HISTORY — DX: Other complications of anesthesia, initial encounter: T88.59XA

## 2018-09-19 HISTORY — DX: Adverse effect of unspecified anesthetic, initial encounter: T41.45XA

## 2018-09-19 LAB — CBC WITH DIFFERENTIAL/PLATELET
ABS IMMATURE GRANULOCYTES: 0.02 10*3/uL (ref 0.00–0.07)
BASOS PCT: 0 %
Basophils Absolute: 0 10*3/uL (ref 0.0–0.1)
Eosinophils Absolute: 0.1 10*3/uL (ref 0.0–0.5)
Eosinophils Relative: 1 %
HEMATOCRIT: 44.6 % (ref 36.0–46.0)
HEMOGLOBIN: 13.8 g/dL (ref 12.0–15.0)
IMMATURE GRANULOCYTES: 0 %
LYMPHS PCT: 15 %
Lymphs Abs: 1 10*3/uL (ref 0.7–4.0)
MCH: 29.2 pg (ref 26.0–34.0)
MCHC: 30.9 g/dL (ref 30.0–36.0)
MCV: 94.3 fL (ref 80.0–100.0)
MONO ABS: 0.6 10*3/uL (ref 0.1–1.0)
MONOS PCT: 10 %
NEUTROS ABS: 4.8 10*3/uL (ref 1.7–7.7)
NEUTROS PCT: 74 %
Platelets: 244 10*3/uL (ref 150–400)
RBC: 4.73 MIL/uL (ref 3.87–5.11)
RDW: 13.1 % (ref 11.5–15.5)
WBC: 6.5 10*3/uL (ref 4.0–10.5)
nRBC: 0 % (ref 0.0–0.2)

## 2018-09-19 LAB — TYPE AND SCREEN
ABO/RH(D): O NEG
Antibody Screen: NEGATIVE

## 2018-09-19 LAB — COMPREHENSIVE METABOLIC PANEL
ALBUMIN: 3.4 g/dL — AB (ref 3.5–5.0)
ALK PHOS: 61 U/L (ref 38–126)
ALT: 40 U/L (ref 0–44)
ANION GAP: 11 (ref 5–15)
AST: 41 U/L (ref 15–41)
BILIRUBIN TOTAL: 1 mg/dL (ref 0.3–1.2)
BUN: 13 mg/dL (ref 6–20)
CALCIUM: 9.3 mg/dL (ref 8.9–10.3)
CO2: 22 mmol/L (ref 22–32)
Chloride: 106 mmol/L (ref 98–111)
Creatinine, Ser: 0.79 mg/dL (ref 0.44–1.00)
GLUCOSE: 109 mg/dL — AB (ref 70–99)
Potassium: 4.2 mmol/L (ref 3.5–5.1)
Sodium: 139 mmol/L (ref 135–145)
TOTAL PROTEIN: 6.3 g/dL — AB (ref 6.5–8.1)

## 2018-09-19 LAB — APTT: APTT: 30 s (ref 24–36)

## 2018-09-19 LAB — PROTIME-INR
INR: 0.99
PROTHROMBIN TIME: 13 s (ref 11.4–15.2)

## 2018-09-19 LAB — SURGICAL PCR SCREEN
MRSA, PCR: NEGATIVE
Staphylococcus aureus: NEGATIVE

## 2018-09-19 NOTE — Progress Notes (Signed)
PCP - Dr. Alain Marion Cardiologist - denies  Chest x-ray - 09/19/18 EKG - 09/19/18 Stress Test - denies ECHO - denies Cardiac Cath - denies  Sleep Study - patient has had sleep study several years ago, and used CPAP in past. Patient states that following her bariatric surgery she has not needed CPAP and no longer uses.   Aspirin Instructions: patient informed to stop ASA products, ketorolac and voltaren gel 7 days prior to surgery.   Anesthesia review: no  Patient denies shortness of breath, fever, cough and chest pain at PAT appointment   Patient verbalized understanding of instructions that were given to them at the PAT appointment. Patient was also instructed that they will need to review over the PAT instructions again at home before surgery.

## 2018-09-25 ENCOUNTER — Ambulatory Visit (INDEPENDENT_AMBULATORY_CARE_PROVIDER_SITE_OTHER): Payer: 59 | Admitting: Obstetrics and Gynecology

## 2018-09-25 ENCOUNTER — Encounter: Payer: Self-pay | Admitting: Obstetrics and Gynecology

## 2018-09-25 ENCOUNTER — Other Ambulatory Visit: Payer: Self-pay

## 2018-09-25 VITALS — BP 112/84 | HR 68 | Ht 65.5 in | Wt 227.6 lb

## 2018-09-25 DIAGNOSIS — R61 Generalized hyperhidrosis: Secondary | ICD-10-CM

## 2018-09-25 DIAGNOSIS — Z01419 Encounter for gynecological examination (general) (routine) without abnormal findings: Secondary | ICD-10-CM

## 2018-09-25 DIAGNOSIS — R232 Flushing: Secondary | ICD-10-CM | POA: Diagnosis not present

## 2018-09-25 DIAGNOSIS — N816 Rectocele: Secondary | ICD-10-CM

## 2018-09-25 DIAGNOSIS — N393 Stress incontinence (female) (male): Secondary | ICD-10-CM | POA: Diagnosis not present

## 2018-09-25 NOTE — Patient Instructions (Addendum)
You can try Estroven or Estroven pm for hot flashes and night sweats  EXERCISE AND DIET:  We recommended that you start or continue a regular exercise program for good health. Regular exercise means any activity that makes your heart beat faster and makes you sweat.  We recommend exercising at least 30 minutes per day at least 3 days a week, preferably 4 or 5.  We also recommend a diet low in fat and sugar.  Inactivity, poor dietary choices and obesity can cause diabetes, heart attack, stroke, and kidney damage, among others.    ALCOHOL AND SMOKING:  Women should limit their alcohol intake to no more than 7 drinks/beers/glasses of wine (combined, not each!) per week. Moderation of alcohol intake to this level decreases your risk of breast cancer and liver damage. And of course, no recreational drugs are part of a healthy lifestyle.  And absolutely no smoking or even second hand smoke. Most people know smoking can cause heart and lung diseases, but did you know it also contributes to weakening of your bones? Aging of your skin?  Yellowing of your teeth and nails?  CALCIUM AND VITAMIN D:  Adequate intake of calcium and Vitamin D are recommended.  The recommendations for exact amounts of these supplements seem to change often, but generally speaking 1,200 mg of calcium (between diet and supplements) and 800 units of Vitamin D per day seems prudent. Certain women may benefit from higher intake of Vitamin D.  If you are among these women, your doctor will have told you during your visit.    PAP SMEARS:  Pap smears, to check for cervical cancer or precancers,  have traditionally been done yearly, although recent scientific advances have shown that most women can have pap smears less often.  However, every woman still should have a physical exam from her gynecologist every year. It will include a breast check, inspection of the vulva and vagina to check for abnormal growths or skin changes, a visual exam of the  cervix, and then an exam to evaluate the size and shape of the uterus and ovaries.  And after 56 years of age, a rectal exam is indicated to check for rectal cancers. We will also provide age appropriate advice regarding health maintenance, like when you should have certain vaccines, screening for sexually transmitted diseases, bone density testing, colonoscopy, mammograms, etc.   MAMMOGRAMS:  All women over 90 years old should have a yearly mammogram. Many facilities now offer a "3D" mammogram, which may cost around $50 extra out of pocket. If possible,  we recommend you accept the option to have the 3D mammogram performed.  It both reduces the number of women who will be called back for extra views which then turn out to be normal, and it is better than the routine mammogram at detecting truly abnormal areas.    COLONOSCOPY:  Colonoscopy to screen for colon cancer is recommended for all women at age 73.  We know, you hate the idea of the prep.  We agree, BUT, having colon cancer and not knowing it is worse!!  Colon cancer so often starts as a polyp that can be seen and removed at colonscopy, which can quite literally save your life!  And if your first colonoscopy is normal and you have no family history of colon cancer, most women don't have to have it again for 10 years.  Once every ten years, you can do something that may end up saving your life, right?  We will be happy to help you get it scheduled when you are ready.  Be sure to check your insurance coverage so you understand how much it will cost.  It may be covered as a preventative service at no cost, but you should check your particular policy.      Breast Self-Awareness Breast self-awareness means being familiar with how your breasts look and feel. It involves checking your breasts regularly and reporting any changes to your health care provider. Practicing breast self-awareness is important. A change in your breasts can be a sign of a serious  medical problem. Being familiar with how your breasts look and feel allows you to find any problems early, when treatment is more likely to be successful. All women should practice breast self-awareness, including women who have had breast implants. How to do a breast self-exam One way to learn what is normal for your breasts and whether your breasts are changing is to do a breast self-exam. To do a breast self-exam: Look for Changes  1. Remove all the clothing above your waist. 2. Stand in front of a mirror in a room with good lighting. 3. Put your hands on your hips. 4. Push your hands firmly downward. 5. Compare your breasts in the mirror. Look for differences between them (asymmetry), such as: ? Differences in shape. ? Differences in size. ? Puckers, dips, and bumps in one breast and not the other. 6. Look at each breast for changes in your skin, such as: ? Redness. ? Scaly areas. 7. Look for changes in your nipples, such as: ? Discharge. ? Bleeding. ? Dimpling. ? Redness. ? A change in position. Feel for Changes  Carefully feel your breasts for lumps and changes. It is best to do this while lying on your back on the floor and again while sitting or standing in the shower or tub with soapy water on your skin. Feel each breast in the following way:  Place the arm on the side of the breast you are examining above your head.  Feel your breast with the other hand.  Start in the nipple area and make  inch (2 cm) overlapping circles to feel your breast. Use the pads of your three middle fingers to do this. Apply light pressure, then medium pressure, then firm pressure. The light pressure will allow you to feel the tissue closest to the skin. The medium pressure will allow you to feel the tissue that is a little deeper. The firm pressure will allow you to feel the tissue close to the ribs.  Continue the overlapping circles, moving downward over the breast until you feel your ribs below  your breast.  Move one finger-width toward the center of the body. Continue to use the  inch (2 cm) overlapping circles to feel your breast as you move slowly up toward your collarbone.  Continue the up and down exam using all three pressures until you reach your armpit.  Write Down What You Find  Write down what is normal for each breast and any changes that you find. Keep a written record with breast changes or normal findings for each breast. By writing this information down, you do not need to depend only on memory for size, tenderness, or location. Write down where you are in your menstrual cycle, if you are still menstruating. If you are having trouble noticing differences in your breasts, do not get discouraged. With time you will become more familiar with the variations in your  breasts and more comfortable with the exam. How often should I examine my breasts? Examine your breasts every month. If you are breastfeeding, the best time to examine your breasts is after a feeding or after using a breast pump. If you menstruate, the best time to examine your breasts is 5-7 days after your period is over. During your period, your breasts are lumpier, and it may be more difficult to notice changes. When should I see my health care provider? See your health care provider if you notice:  A change in shape or size of your breasts or nipples.  A change in the skin of your breast or nipples, such as a reddened or scaly area.  Unusual discharge from your nipples.  A lump or thick area that was not there before.  Pain in your breasts.  Anything that concerns you.  This information is not intended to replace advice given to you by your health care provider. Make sure you discuss any questions you have with your health care provider. Document Released: 11/12/2005 Document Revised: 04/19/2016 Document Reviewed: 10/02/2015 Elsevier Interactive Patient Education  2018 [place].  Menopause and  Hormone Replacement Therapy What is hormone replacement therapy? Hormone replacement therapy (HRT) is the use of artificial (synthetic) hormones to replace hormones that your body stops producing during menopause. Menopause is the normal time of life when menstrual periods stop completely and the ovaries stop producing the female hormones estrogen and progesterone. This lack of hormones can affect your health and cause undesirable symptoms. HRT can relieve some of those symptoms. What are my options for HRT? HRT may consist of the synthetic hormones estrogen and progestin, or it may consist of only estrogen (estrogen-only therapy). You and your health care provider will decide which form of HRT is best for you. If you choose to be on HRT and you have a uterus, estrogen and progestin are usually prescribed. Estrogen-only therapy is used for women who do not have a uterus. Possible options for taking HRT include:  Pills.  Patches.  Gels.  Sprays.  Vaginal cream.  Vaginal rings.  Vaginal inserts.  The amount of hormone(s) that you take and how long you take the hormone(s) varies depending on your individual health. It is important to:  Begin HRT with the lowest possible dosage.  Stop HRT as soon as your health care provider tells you to stop.  Work with your health care provider so that you feel informed and comfortable with your decisions.  What are the benefits of HRT? HRT can reduce the frequency and severity of menopausal symptoms. Benefits of HRT vary depending on the menopausal symptoms that you have, the severity of your symptoms, and your overall health. HRT may help to improve the following menopausal symptoms:  Hot flashes and night sweats. These are sudden feelings of heat that spread over the face and body. The skin may turn red, like a blush. Night sweats are hot flashes that happen while you are sleeping or trying to sleep.  Bone loss (osteoporosis). The body loses  calcium more quickly after menopause, causing the bones to become weaker. This can increase the risk for bone breaks (fractures).  Vaginal dryness. The lining of the vagina can become thin and dry, which can cause pain during sexual intercourse or cause infection, burning, or itching.  Urinary tract infections.  Urinary incontinence. This is a decreased ability to control when you urinate.  Irritability.  Short-term memory problems.  What are the risks of HRT?  Risks of HRT vary depending on your individual health and medical history. Risks of HRT also depend on whether you receive both estrogen and progestin or you receive estrogen only.HRT may increase the risk of:  Spotting. This is when a small amount of bloodleaks from the vagina unexpectedly.  Endometrial cancer. This cancer is in the lining of the uterus (endometrium).  Breast cancer.  Increased density of breast tissue. This can make it harder to find breast cancer on a breast X-ray (mammogram).  Stroke.  Heart attack.  Blood clots.  Gallbladder disease.  Risks of HRT can increase if you have any of the following conditions:  Endometrial cancer.  Liver disease.  Heart disease.  Breast cancer.  History of blood clots.  History of stroke.  How should I care for myself while I am on HRT?  Take over-the-counter and prescription medicines only as told by your health care provider.  Get mammograms, pelvic exams, and medical checkups as often as told by your health care provider.  Have Pap tests done as often as told by your health care provider. A Pap test is sometimes called a Pap smear. It is a screening test that is used to check for signs of cancer of the cervix and vagina. A Pap test can also identify the presence of infection or precancerous changes. Pap tests may be done: ? Every 3 years, starting at age 79. ? Every 5 years, starting after age 62, in combination with testing for human papillomavirus  (HPV). ? More often or less often depending on other medical conditions you have, your age, and other risk factors.  It is your responsibility to get your Pap test results. Ask your health care provider or the department performing the test when your results will be ready.  Keep all follow-up visits as told by your health care provider. This is important. When should I seek medical care? Talk with your health care provider if:  You have any of these: ? Pain or swelling in your legs. ? Shortness of breath. ? Chest pain. ? Lumps or changes in your breasts or armpits. ? Slurred speech. ? Pain, burning, or bleeding when you urine.  You develop any of these: ? Unusual vaginal bleeding. ? Dizziness or headaches. ? Weakness or numbness in any part of your arms or legs. ? Pain in your abdomen.  This information is not intended to replace advice given to you by your health care provider. Make sure you discuss any questions you have with your health care provider. Document Released: 08/11/2003 Document Revised: 10/09/2016 Document Reviewed: 05/16/2015 Elsevier Interactive Patient Education  2017 [place]. Menopause Menopause is the normal time of life when menstrual periods stop completely. Menopause is complete when you have missed 12 consecutive menstrual periods. It usually occurs between the ages of 42 years and 22 years. Very rarely does a woman develop menopause before the age of 82 years. At menopause, your ovaries stop producing the female hormones estrogen and progesterone. This can cause undesirable symptoms and also affect your health. Sometimes the symptoms may occur 4-5 years before the menopause begins. There is no relationship between menopause and:  Oral contraceptives.  Number of children you had.  Race.  The age your menstrual periods started (menarche).  Heavy smokers and very thin women may develop menopause earlier in life. What are the causes?  The ovaries  stop producing the female hormones estrogen and progesterone. Other causes include:  Surgery to remove both ovaries.  The  ovaries stop functioning for no known reason.  Tumors of the pituitary gland in the brain.  Medical disease that affects the ovaries and hormone production.  Radiation treatment to the abdomen or pelvis.  Chemotherapy that affects the ovaries.  What are the signs or symptoms?  Hot flashes.  Night sweats.  Decrease in sex drive.  Vaginal dryness and thinning of the vagina causing painful intercourse.  Dryness of the skin and developing wrinkles.  Headaches.  Tiredness.  Irritability.  Memory problems.  Weight gain.  Bladder infections.  Hair growth of the face and chest.  Infertility. More serious symptoms include:  Loss of bone (osteoporosis) causing breaks (fractures).  Depression.  Hardening and narrowing of the arteries (atherosclerosis) causing heart attacks and strokes.  How is this diagnosed?  When the menstrual periods have stopped for 12 straight months.  Physical exam.  Hormone studies of the blood. How is this treated? There are many treatment choices and nearly as many questions about them. The decisions to treat or not to treat menopausal changes is an individual choice made with your health care provider. Your health care provider can discuss the treatments with you. Together, you can decide which treatment will work best for you. Your treatment choices may include:  Hormone therapy (estrogen and progesterone).  Non-hormonal medicines.  Treating the individual symptoms with medicine (for example antidepressants for depression).  Herbal medicines that may help specific symptoms.  Counseling by a psychiatrist or psychologist.  Group therapy.  Lifestyle changes including: ? Eating healthy. ? Regular exercise. ? Limiting caffeine and alcohol. ? Stress management and meditation.  No treatment.  Follow these  instructions at home:  Take the medicine your health care provider gives you as directed.  Get plenty of sleep and rest.  Exercise regularly.  Eat a diet that contains calcium (good for the bones) and soy products (acts like estrogen hormone).  Avoid alcoholic beverages.  Do not smoke.  If you have hot flashes, dress in layers.  Take supplements, calcium, and vitamin D to strengthen bones.  You can use over-the-counter lubricants or moisturizers for vaginal dryness.  Group therapy is sometimes very helpful.  Acupuncture may be helpful in some cases. Contact a health care provider if:  You are not sure you are in menopause.  You are having menopausal symptoms and need advice and treatment.  You are still having menstrual periods after age 62 years.  You have pain with intercourse.  Menopause is complete (no menstrual period for 12 months) and you develop vaginal bleeding.  You need a referral to a specialist (gynecologist, psychiatrist, or psychologist) for treatment. Get help right away if:  You have severe depression.  You have excessive vaginal bleeding.  You fell and think you have a broken bone.  You have pain when you urinate.  You develop leg or chest pain.  You have a fast pounding heart beat (palpitations).  You have severe headaches.  You develop vision problems.  You feel a lump in your breast.  You have abdominal pain or severe indigestion. This information is not intended to replace advice given to you by your health care provider. Make sure you discuss any questions you have with your health care provider. Document Released: 02/02/2004 Document Revised: 04/19/2016 Document Reviewed: 06/11/2013 Elsevier Interactive Patient Education  2017 [place]. Menopause and Herbal Products What is menopause? Menopause is the normal time of life when menstrual periods decrease in frequency and eventually stop completely. This process can take several  years for some women. Menopause is complete when you have had an absence of menstruation for a full year since your last menstrual period. It usually occurs between the ages of 59 and 81. It is not common for menopause to begin before the age of 58. During menopause, your body stops producing the female hormones estrogen and progesterone. Common symptoms associated with this loss of hormones (vasomotor symptoms) are:  Hot flashes.  Hot flushes.  Night sweats.  Other common symptoms and complications of menopause include:  Decrease in sex drive.  Vaginal dryness and thinning of the walls of the vagina. This can make sex painful.  Dryness of the skin and development of wrinkles.  Headaches.  Tiredness.  Irritability.  Memory problems.  Weight gain.  Bladder infections.  Hair growth on the face and chest.  Inability to reproduce offspring (infertility).  Loss of density in the bones (osteoporosis) increasing your risk for breaks (fractures).  Depression.  Hardening and narrowing of the arteries (atherosclerosis). This increases your risk of heart attack and stroke.  What treatment options are available? There are many treatment choices for menopause symptoms. The most common treatment is hormone replacement therapy. Many alternative therapies for menopause are emerging, including the use of herbal products. These supplements can be found in the form of herbs, teas, oils, tinctures, and pills. Common herbal supplements for menopause are made from plants that contain phytoestrogens. Phytoestrogens are compounds that occur naturally in plants and plant products. They act like estrogen in the body. Foods and herbs that contain phytoestrogens include:  Soy.  Flax seeds.  Red clover.  Ginseng.  What menopause symptoms may be helped if I use herbal products?  Vasomotor symptoms. These may be helped by: ? Soy. Some studies show that soy may have a moderate benefit for hot  flashes. ? Black cohosh. There is limited evidence indicating this may be beneficial for hot flashes.  Symptoms that are related to heart and blood vessel disease. These may be helped by soy. Studies have shown that soy can help to lower cholesterol.  Depression. This may be helped by: ? St. John's wort. There is limited evidence that shows this may help mild to moderate depression. ? Black cohosh. There is evidence that this may help depression and mood swings.  Osteoporosis. Soy may help to decrease bone loss that is associated with menopause and may prevent osteoporosis. Limited evidence indicates that red clover may offer some bone loss protection as well. Other herbal products that are commonly used during menopause lack enough evidence to support their use as a replacement for conventional menopause therapies. These products include evening primrose, ginseng, and red clover. What are the cases when herbal products should not be used during menopause? Do not use herbal products during menopause without your health care provider's approval if:  You are taking medicine.  You have a preexisting liver condition.  Are there any risks in my taking herbal products during menopause? If you choose to use herbal products to help with symptoms of menopause, keep in mind that:  Different supplements have different and unmeasured amounts of herbal ingredients.  Herbal products are not regulated the same way that medicines are.  Concentrations of herbs may vary depending on the way they are prepared. For example, the concentration may be different in a pill, tea, oil, and tincture.  Little is known about the risks of using herbal products, particularly the risks of long-term use.  Some herbal supplements can be harmful when  combined with certain medicines.  Most commonly reported side effects of herbal products are mild. However, if used improperly, many herbal supplements can cause serious  problems. Talk to your health care provider before starting any herbal product. If problems develop, stop taking the supplement and let your health care provider know. This information is not intended to replace advice given to you by your health care provider. Make sure you discuss any questions you have with your health care provider. Document Released: 04/30/2008 Document Revised: 10/09/2016 Document Reviewed: 04/27/2014 Elsevier Interactive Patient Education  2017 [employer]  About Rectocele  Overview  A rectocele is a type of hernia which causes different degrees of bulging of the rectal tissues into the vaginal wall.  You may even notice that it presses against the vaginal wall so much that some vaginal tissues droop outside of the opening of your vagina.  Causes of Rectocele  The most common cause is childbirth.  The muscles and ligaments in the pelvis that hold up and support the female organs and vagina become stretched and weakened during labor and delivery.  The more babies you have, the more the support tissues are stretched and weakened.  Not everyone who has a baby will develop a rectocele.  Some women have stronger supporting tissue in the pelvis and may not have as much of a problem as others.  Women who have a Cesarean section usually do not get rectocele's unless they pushed a long time prior to the cesarean delivery.  Other conditions that can cause a rectocele include chronic constipation, a chronic cough, a lot of heavy lifting, and obesity.  Older women may have this problem because the loss of female hormones causes the vaginal tissue to become weaker.  Symptoms  There may not be any symptoms.  If you do have symptoms, they may include:  Pelvic pressure in the rectal area  Protrusion of the lower part of the vagina through the opening of the vagina  Constipation and trapping of the stool, making it difficult to have a bowel movement.  In severe cases, you may have  to press on the lower part of your vagina to help push the stool out of you rectum.  This is called splinting to empty.  Diagnosing Rectocele  Your health care provider will ask about your symptoms and perform a pelvic exam.  S/he will ask you to bear down, pushing like you are having a bowel movement so as to see how far the lower part of the vagina protrudes into the vagina and possible outside of the vagina.  Your provider will also ask you to contract the muscles of your pelvis (like you are stopping the stream in the middle of urinating) to determine the strength of your pelvic muscles.  Your provider may also do a rectal exam.  Treatment Options  If you do not have any symptoms, no treatment may be necessary.  Other treatment options include:  Pelvic floor exercises: Contracting the muscles in your genital area may help strengthen your muscles and support the organs.  Be sure to get proper exercise instruction from you physical therapist.  A pessary (removealbe pelvic support device) sometimes helps rectocele symptoms.  Surgery: Surgical repair may be necessary. In some cases the uterus may need to be taken out ( a hysterectomy) as well.  There are many types of surgery for pelvic support problems.  Look for physicians who specialize in repair procedures.  You can take care of yourself by:  Treating and preventing constipation  Avoiding heavy lifting, and lifting correctly (with your legs, not with you waist or back)  Treating a chronic cough or bronchitis  Not smoking  avoiding too much weight gain  Doing pelvic floor exercises   2007, [employer] Doc.33  Kegel Exercises Kegel exercises help strengthen the muscles that support the rectum, vagina, small intestine, bladder, and uterus. Doing Kegel exercises can help:  Improve bladder and bowel control.  Improve sexual response.  Reduce problems and discomfort during pregnancy.  Kegel exercises involve  squeezing your pelvic floor muscles, which are the same muscles you squeeze when you try to stop the flow of urine. The exercises can be done while sitting, standing, or lying down, but it is best to vary your position. Phase 1 exercises 1. Squeeze your pelvic floor muscles tight. You should feel a tight lift in your rectal area. If you are a female, you should also feel a tightness in your vaginal area. Keep your stomach, buttocks, and legs relaxed. 2. Hold the muscles tight for up to 10 seconds. 3. Relax your muscles. Repeat this exercise 50 times a day or as many times as told by your health care provider. Continue to do this exercise for at least 4-6 weeks or for as long as told by your health care provider. This information is not intended to replace advice given to you by your health care provider. Make sure you discuss any questions you have with your health care provider. Document Released: 10/29/2012 Document Revised: 07/07/2016 Document Reviewed: 10/02/2015 Elsevier Interactive Patient Education  [employer].

## 2018-09-25 NOTE — Progress Notes (Signed)
57 y.o. G48P1000 Married White or Caucasian Not Hispanic or Latino female here for annual exam.  Patient reports she is having worsening hot flashes and night sweats over the last 3 weeks. 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 She started having hot flashes and night sweats in the last few months. She is up about 3 times a night, throws the covers off and falls back asleep. Occasional night sweats. 1-2 hot flashes a day.  No vaginal dryness, no dyspareunia.  Sometimes has issues with constipation, she can improve it with dietary changes.  She has mild GSI, will leak with sneeze, she wears a small pad daily. This is a long term issue.   She had a gastric banding, lost 90 lbs. Sleep apnea improved, was on a diuretic for lower ext swelling, went off of that.  She is having a left knee replacement.    No LMP recorded. Patient has had a hysterectomy.          Sexually active: Yes.    The current method of family planning is post menopausal status, post hysterectomy.    Exercising: No.  The patient does not participate in regular exercise at present. Smoker:  no  Health Maintenance: Pap:  2005 WNL History of abnormal Pap:  Yes, may years ago prior to hysterectomy, repeat pap smear was normal per patient MMG: Many years ago, scheduled for 10/14/2018 at the [facility]  Colonoscopy: 2014 polyps, repeat in 5 years Bone Density: 10/02/2016 Normal TDaP: 04/15/2017   Gardasil: N/A   reports that she has never smoked. She has never used smokeless tobacco. She reports that she drinks alcohol. She reports that she does not use drugs. Rare ETOH. Works for the [employer]. Retiring in 1/20. One grown son, 26, married, no children yet.   Past Medical History:  Diagnosis Date  . Allergic rhinitis   . Complication of anesthesia    "hard to wake up in past"  . Endometriosis   . Hemorrhoids   . History of kidney stones   . Hydronephrosis   . Kidney stones   . Obstructive sleep  apnea    used CPAP in past, no longer needed after bariatric surgery  . Osteoarthritis   . Perianal abscess 05/01/2013   6/14 7 o'clock   . Personal history of colonic polyps    Question accuracy, patient report only.  . Urinary incontinence   . VENOUS INSUFFICIENCY, CHRONIC 07/05/2007   Annotation: LE Qualifier: Diagnosis of  By: Dance CMA (Ivins), Kim    . Vitamin D deficiency     Past Surgical History:  Procedure Laterality Date  . ABDOMINAL SURGERY    . ACHILLES TENDON SURGERY Left 12/2017  . COLONOSCOPY N/A 09/04/2013   Procedure: COLONOSCOPY;  Surgeon: Inda Castle, MD;  Location: [facility];  Service: Endoscopy;  Laterality: N/A;  . COLONOSCOPY W/ POLYPECTOMY    . DILATION AND CURETTAGE OF UTERUS    . HERNIA REPAIR    . HIATAL HERNIA REPAIR    . LAPAROSCOPIC CHOLECYSTECTOMY    . LAPAROSCOPIC GASTRIC BANDING    . VAGINAL HYSTERECTOMY      Current Outpatient Medications  Medication Sig Dispense Refill  . b complex vitamins tablet Take 1 tablet by mouth daily. 100 tablet 3  . Cholecalciferol (VITAMIN D3) 2000 UNITS capsule Take 1 capsule (2,000 Units total) by mouth daily. 100 capsule 3  . Cholecalciferol (VITAMIN D3) 50000 units CAPS Take 1 capsule by mouth once a  week. 6 capsule 0  . diclofenac sodium (VOLTAREN) 1 % GEL Apply 2 g topically 4 (four) times daily. (Patient taking differently: Apply 2 g topically daily as needed (pain). ) 1 Tube 1  . ketorolac (TORADOL) 10 MG tablet Take 1 tablet (10 mg total) by mouth every 6 (six) hours as needed. For renal colic (Patient not taking: Reported on 09/18/2018) 20 tablet 0   No current facility-administered medications for this visit.     Family History  Problem Relation Age of Onset  . Diabetes Mother   . Cervical cancer Mother   . Hypertension Mother     Review of Systems  Constitutional: Negative.   HENT: Negative.   Eyes: Negative.   Respiratory: Negative.   Cardiovascular: Negative.   Gastrointestinal:  Negative.   Endocrine: Negative.   Genitourinary:       Night sweats Hot flashes  Musculoskeletal: Negative.   Skin: Negative.   Allergic/Immunologic: Negative.   Neurological: Negative.   Hematological: Negative.   Psychiatric/Behavioral: Negative.     Exam:   BP 112/84 (BP Location: Right Arm, Patient Position: Sitting, Cuff Size: Normal)   Pulse 68   Ht 5' 5.5" (1.664 m)   Wt 227 lb 9.6 oz (103.2 kg)   BMI 37.30 kg/m   Weight change: @WEIGHTCHANGE @ Height:   Height: 5' 5.5" (166.4 cm)  Ht Readings from Last 3 Encounters:  09/25/18 5' 5.5" (1.664 m)  09/19/18 5\' 6"  (1.676 m)  08/28/18 5\' 5"  (1.651 m)    General appearance: alert, cooperative and appears stated age Head: Normocephalic, without obvious abnormality, atraumatic Neck: no adenopathy, supple, symmetrical, trachea midline and thyroid normal to inspection and palpation Lungs: clear to auscultation bilaterally Cardiovascular: regular rate and rhythm Breasts: normal appearance, no masses or tenderness Abdomen: soft, non-tender; non distended,  no masses,  no organomegaly Extremities: extremities normal, atraumatic, no cyanosis or edema Skin: Skin color, texture, turgor normal. No rashes or lesions Lymph nodes: Cervical, supraclavicular, and axillary nodes normal. No abnormal inguinal nodes palpated Neurologic: Grossly normal   Pelvic: External genitalia:  no lesions              Urethra:  normal appearing urethra with no masses, tenderness or lesions              Bartholins and Skenes: normal                 Vagina: normal appearing vagina with normal color and discharge, no lesions. Grade 1-2 rectocele, small              Cervix: absent               Bimanual Exam:  Uterus:  uterus absent              Adnexa: no mass, fullness, tenderness               Rectovaginal: Confirms               Anus:  normal sphincter tone, no lesions  Chaperone was present for exam.  A:  Well Woman with normal exam  H/O TVH  years ago for endometrial hyperplasia  Recent vasomotor symptoms  GSI, long term  Small rectocele  P:   No pap needed  Try estroven/estroven pm for vasomotor symptoms  Avoid triggers  Discussed gabapentin, SSRI, and ERT  Mammogram due  Colonoscopy UTD  Discussed breast self exam  Discussed calcium and vit D intake  Kegel information given,  discussed PT, incontinence dish and surgery

## 2018-09-28 MED ORDER — TRANEXAMIC ACID-NACL 1000-0.7 MG/100ML-% IV SOLN
1000.0000 mg | INTRAVENOUS | Status: AC
  Administered 2018-09-29: 1000 mg via INTRAVENOUS
  Filled 2018-09-28: qty 100

## 2018-09-28 MED ORDER — TRANEXAMIC ACID 1000 MG/10ML IV SOLN
2000.0000 mg | INTRAVENOUS | Status: AC
  Administered 2018-09-29: 2000 mg via TOPICAL
  Filled 2018-09-28: qty 20

## 2018-09-29 ENCOUNTER — Encounter (HOSPITAL_COMMUNITY)
Admission: RE | Disposition: A | Discharge status: Home Health | Payer: Self-pay | Source: Ambulatory Visit | Attending: Orthopaedic Surgery

## 2018-09-29 ENCOUNTER — Encounter (HOSPITAL_COMMUNITY): Payer: Self-pay | Admitting: *Deleted

## 2018-09-29 ENCOUNTER — Inpatient Hospital Stay (HOSPITAL_COMMUNITY): Payer: 59 | Admitting: Vascular Surgery

## 2018-09-29 ENCOUNTER — Inpatient Hospital Stay (HOSPITAL_COMMUNITY): Payer: 59 | Admitting: Anesthesiology

## 2018-09-29 ENCOUNTER — Inpatient Hospital Stay (HOSPITAL_COMMUNITY): Payer: 59

## 2018-09-29 ENCOUNTER — Inpatient Hospital Stay (HOSPITAL_COMMUNITY)
Admission: RE | Admit: 2018-09-29 | Discharge: 2018-10-01 | DRG: 470 | Disposition: A | Discharge status: Home Health | Payer: 59 | Source: Ambulatory Visit | Attending: Orthopaedic Surgery | Admitting: Orthopaedic Surgery

## 2018-09-29 DIAGNOSIS — Z887 Allergy status to serum and vaccine status: Secondary | ICD-10-CM

## 2018-09-29 DIAGNOSIS — Z96659 Presence of unspecified artificial knee joint: Secondary | ICD-10-CM

## 2018-09-29 DIAGNOSIS — Z6837 Body mass index (BMI) 37.0-37.9, adult: Secondary | ICD-10-CM

## 2018-09-29 DIAGNOSIS — M1712 Unilateral primary osteoarthritis, left knee: Secondary | ICD-10-CM | POA: Diagnosis not present

## 2018-09-29 DIAGNOSIS — Z8719 Personal history of other diseases of the digestive system: Secondary | ICD-10-CM

## 2018-09-29 DIAGNOSIS — J309 Allergic rhinitis, unspecified: Secondary | ICD-10-CM | POA: Diagnosis present

## 2018-09-29 DIAGNOSIS — Z833 Family history of diabetes mellitus: Secondary | ICD-10-CM

## 2018-09-29 DIAGNOSIS — G4733 Obstructive sleep apnea (adult) (pediatric): Secondary | ICD-10-CM | POA: Diagnosis present

## 2018-09-29 DIAGNOSIS — E559 Vitamin D deficiency, unspecified: Secondary | ICD-10-CM | POA: Diagnosis present

## 2018-09-29 DIAGNOSIS — Z8249 Family history of ischemic heart disease and other diseases of the circulatory system: Secondary | ICD-10-CM

## 2018-09-29 DIAGNOSIS — Z8049 Family history of malignant neoplasm of other genital organs: Secondary | ICD-10-CM

## 2018-09-29 DIAGNOSIS — Z90710 Acquired absence of both cervix and uterus: Secondary | ICD-10-CM

## 2018-09-29 DIAGNOSIS — Z881 Allergy status to other antibiotic agents status: Secondary | ICD-10-CM

## 2018-09-29 DIAGNOSIS — E669 Obesity, unspecified: Secondary | ICD-10-CM | POA: Diagnosis present

## 2018-09-29 DIAGNOSIS — Z9049 Acquired absence of other specified parts of digestive tract: Secondary | ICD-10-CM

## 2018-09-29 DIAGNOSIS — Z96652 Presence of left artificial knee joint: Secondary | ICD-10-CM

## 2018-09-29 DIAGNOSIS — Z87442 Personal history of urinary calculi: Secondary | ICD-10-CM

## 2018-09-29 HISTORY — PX: TOTAL KNEE ARTHROPLASTY: SHX125

## 2018-09-29 SURGERY — ARTHROPLASTY, KNEE, TOTAL
Anesthesia: Monitor Anesthesia Care | Site: Knee | Laterality: Left

## 2018-09-29 MED ORDER — SORBITOL 70 % SOLN
30.0000 mL | Freq: Every day | Status: DC | PRN
  Administered 2018-10-01: 30 mL via ORAL
  Filled 2018-09-29: qty 30

## 2018-09-29 MED ORDER — ACETAMINOPHEN 10 MG/ML IV SOLN: 1000.0000 mg | Freq: Once | INTRAVENOUS | Status: DC | PRN

## 2018-09-29 MED ORDER — PROMETHAZINE HCL 25 MG/ML IJ SOLN: 6.2500 mg | INTRAMUSCULAR | Status: DC | PRN

## 2018-09-29 MED ORDER — PHENOL 1.4 % MT LIQD: 1.0000 | OROMUCOSAL | Status: DC | PRN

## 2018-09-29 MED ORDER — CEFAZOLIN SODIUM-DEXTROSE 2-4 GM/100ML-% IV SOLN
2.0000 g | INTRAVENOUS | Status: AC
  Administered 2018-09-29: 2 g via INTRAVENOUS

## 2018-09-29 MED ORDER — CHLORHEXIDINE GLUCONATE 4 % EX LIQD: 60.0000 mL | Freq: Once | CUTANEOUS | Status: DC

## 2018-09-29 MED ORDER — SODIUM CHLORIDE 0.9% FLUSH
INTRAVENOUS | Status: DC | PRN
  Administered 2018-09-29: 40 mL

## 2018-09-29 MED ORDER — ASPIRIN 81 MG PO CHEW
81.0000 mg | CHEWABLE_TABLET | Freq: Two times a day (BID) | ORAL | Status: DC
  Administered 2018-09-29 – 2018-10-01 (×4): 81 mg via ORAL
  Filled 2018-09-29 (×4): qty 1

## 2018-09-29 MED ORDER — PROMETHAZINE HCL 25 MG PO TABS: 25.0000 mg | ORAL_TABLET | Freq: Four times a day (QID) | ORAL | 1 refills | Status: DC | PRN

## 2018-09-29 MED ORDER — METOCLOPRAMIDE HCL 5 MG/ML IJ SOLN: 5.0000 mg | Freq: Three times a day (TID) | INTRAMUSCULAR | Status: DC | PRN

## 2018-09-29 MED ORDER — TRANEXAMIC ACID-NACL 1000-0.7 MG/100ML-% IV SOLN
1000.0000 mg | Freq: Once | INTRAVENOUS | Status: AC
  Administered 2018-09-29: 1000 mg via INTRAVENOUS
  Filled 2018-09-29: qty 100

## 2018-09-29 MED ORDER — DEXAMETHASONE SODIUM PHOSPHATE 10 MG/ML IJ SOLN
10.0000 mg | Freq: Once | INTRAMUSCULAR | Status: AC
  Administered 2018-09-30: 10 mg via INTRAVENOUS
  Filled 2018-09-29: qty 1

## 2018-09-29 MED ORDER — BUPIVACAINE LIPOSOME 1.3 % IJ SUSP
20.0000 mL | INTRAMUSCULAR | Status: AC
  Administered 2018-09-29: 20 mL
  Filled 2018-09-29: qty 20

## 2018-09-29 MED ORDER — OXYCODONE HCL 5 MG PO TABS: 5.0000 mg | ORAL_TABLET | Freq: Once | ORAL | Status: DC | PRN

## 2018-09-29 MED ORDER — FENTANYL CITRATE (PF) 100 MCG/2ML IJ SOLN
25.0000 ug | INTRAMUSCULAR | Status: DC | PRN
  Administered 2018-09-29 (×4): 25 ug via INTRAVENOUS

## 2018-09-29 MED ORDER — OXYCODONE HCL 5 MG PO TABS
5.0000 mg | ORAL_TABLET | ORAL | Status: DC | PRN
  Administered 2018-10-01: 10 mg via ORAL
  Filled 2018-09-29: qty 2

## 2018-09-29 MED ORDER — KETOROLAC TROMETHAMINE 30 MG/ML IJ SOLN
INTRAMUSCULAR | Status: AC
  Filled 2018-09-29: qty 1

## 2018-09-29 MED ORDER — CEFAZOLIN SODIUM-DEXTROSE 2-4 GM/100ML-% IV SOLN
2.0000 g | Freq: Four times a day (QID) | INTRAVENOUS | Status: AC
  Administered 2018-09-29 – 2018-09-30 (×3): 2 g via INTRAVENOUS
  Filled 2018-09-29 (×3): qty 100

## 2018-09-29 MED ORDER — ONDANSETRON HCL 4 MG PO TABS: 4.0000 mg | ORAL_TABLET | Freq: Three times a day (TID) | ORAL | 0 refills | Status: DC | PRN

## 2018-09-29 MED ORDER — FENTANYL CITRATE (PF) 100 MCG/2ML IJ SOLN
INTRAMUSCULAR | Status: AC
  Filled 2018-09-29: qty 2

## 2018-09-29 MED ORDER — SODIUM CHLORIDE 0.9 % IV SOLN
INTRAVENOUS | Status: DC
  Administered 2018-09-30: 06:00:00 via INTRAVENOUS

## 2018-09-29 MED ORDER — MAGNESIUM CITRATE PO SOLN: 1.0000 | Freq: Once | ORAL | Status: DC | PRN

## 2018-09-29 MED ORDER — PHENYLEPHRINE HCL 10 MG/ML IJ SOLN
INTRAMUSCULAR | Status: DC | PRN
  Administered 2018-09-29: 120 ug via INTRAVENOUS
  Administered 2018-09-29: 40 ug via INTRAVENOUS
  Administered 2018-09-29 (×3): 80 ug via INTRAVENOUS

## 2018-09-29 MED ORDER — MIDAZOLAM HCL 2 MG/2ML IJ SOLN: 1.0000 mg | Freq: Once | INTRAMUSCULAR | Status: DC

## 2018-09-29 MED ORDER — POLYETHYLENE GLYCOL 3350 17 G PO PACK: 17.0000 g | PACK | Freq: Every day | ORAL | Status: DC | PRN

## 2018-09-29 MED ORDER — VANCOMYCIN HCL 1000 MG IV SOLR
INTRAVENOUS | Status: AC
  Filled 2018-09-29: qty 1000

## 2018-09-29 MED ORDER — OXYCODONE HCL 5 MG PO TABS
ORAL_TABLET | ORAL | Status: AC
  Filled 2018-09-29: qty 3

## 2018-09-29 MED ORDER — MIDAZOLAM HCL 2 MG/2ML IJ SOLN
INTRAMUSCULAR | Status: AC
  Administered 2018-09-29: 1 mg
  Filled 2018-09-29: qty 2

## 2018-09-29 MED ORDER — DOCUSATE SODIUM 100 MG PO CAPS
100.0000 mg | ORAL_CAPSULE | Freq: Two times a day (BID) | ORAL | Status: DC
  Administered 2018-09-29 – 2018-10-01 (×4): 100 mg via ORAL
  Filled 2018-09-29 (×4): qty 1

## 2018-09-29 MED ORDER — MENTHOL 3 MG MT LOZG: 1.0000 | LOZENGE | OROMUCOSAL | Status: DC | PRN

## 2018-09-29 MED ORDER — OXYCODONE HCL 5 MG/5ML PO SOLN: 5.0000 mg | Freq: Once | ORAL | Status: DC | PRN

## 2018-09-29 MED ORDER — OXYCODONE HCL 5 MG PO TABS: 5.0000 mg | ORAL_TABLET | ORAL | 0 refills | Status: DC | PRN

## 2018-09-29 MED ORDER — CELECOXIB 200 MG PO CAPS
200.0000 mg | ORAL_CAPSULE | Freq: Two times a day (BID) | ORAL | Status: DC
  Administered 2018-09-29 – 2018-10-01 (×4): 200 mg via ORAL
  Filled 2018-09-29 (×4): qty 1

## 2018-09-29 MED ORDER — ONDANSETRON HCL 4 MG PO TABS: 4.0000 mg | ORAL_TABLET | Freq: Four times a day (QID) | ORAL | Status: DC | PRN

## 2018-09-29 MED ORDER — ACETAMINOPHEN 500 MG PO TABS
1000.0000 mg | ORAL_TABLET | Freq: Four times a day (QID) | ORAL | Status: AC
  Administered 2018-09-29 – 2018-09-30 (×3): 1000 mg via ORAL
  Filled 2018-09-29 (×3): qty 2

## 2018-09-29 MED ORDER — ONDANSETRON HCL 4 MG/2ML IJ SOLN: 4.0000 mg | Freq: Four times a day (QID) | INTRAMUSCULAR | Status: DC | PRN

## 2018-09-29 MED ORDER — OXYCODONE HCL 5 MG PO TABS
10.0000 mg | ORAL_TABLET | ORAL | Status: DC | PRN
  Administered 2018-09-29 – 2018-10-01 (×4): 15 mg via ORAL
  Filled 2018-09-29 (×3): qty 3

## 2018-09-29 MED ORDER — GABAPENTIN 300 MG PO CAPS
300.0000 mg | ORAL_CAPSULE | Freq: Three times a day (TID) | ORAL | Status: DC
  Administered 2018-09-29 – 2018-10-01 (×5): 300 mg via ORAL
  Filled 2018-09-29 (×5): qty 1

## 2018-09-29 MED ORDER — ACETAMINOPHEN 325 MG PO TABS: 325.0000 mg | ORAL_TABLET | Freq: Four times a day (QID) | ORAL | Status: DC | PRN

## 2018-09-29 MED ORDER — METHOCARBAMOL 500 MG PO TABS
ORAL_TABLET | ORAL | Status: AC
  Filled 2018-09-29: qty 1

## 2018-09-29 MED ORDER — METHOCARBAMOL 1000 MG/10ML IJ SOLN
500.0000 mg | Freq: Four times a day (QID) | INTRAVENOUS | Status: DC | PRN
  Filled 2018-09-29: qty 5

## 2018-09-29 MED ORDER — EPHEDRINE SULFATE 50 MG/ML IJ SOLN
INTRAMUSCULAR | Status: DC | PRN
  Administered 2018-09-29 (×3): 5 mg via INTRAVENOUS

## 2018-09-29 MED ORDER — SODIUM CHLORIDE 0.9 % IR SOLN
Status: DC | PRN
  Administered 2018-09-29: 3000 mL

## 2018-09-29 MED ORDER — LACTATED RINGERS IV SOLN: INTRAVENOUS | Status: DC

## 2018-09-29 MED ORDER — METHOCARBAMOL 750 MG PO TABS: 750.0000 mg | ORAL_TABLET | Freq: Two times a day (BID) | ORAL | 0 refills | Status: DC | PRN

## 2018-09-29 MED ORDER — METHOCARBAMOL 500 MG PO TABS
500.0000 mg | ORAL_TABLET | Freq: Four times a day (QID) | ORAL | Status: DC | PRN
  Administered 2018-09-29 – 2018-10-01 (×3): 500 mg via ORAL
  Filled 2018-09-29 (×2): qty 1

## 2018-09-29 MED ORDER — LINACLOTIDE 145 MCG PO CAPS
145.0000 ug | ORAL_CAPSULE | Freq: Every day | ORAL | Status: DC
  Administered 2018-09-29 – 2018-10-01 (×3): 145 ug via ORAL
  Filled 2018-09-29 (×3): qty 1

## 2018-09-29 MED ORDER — ASPIRIN EC 81 MG PO TBEC: 81.0000 mg | DELAYED_RELEASE_TABLET | Freq: Two times a day (BID) | ORAL | 0 refills | Status: DC

## 2018-09-29 MED ORDER — HYDROMORPHONE HCL 1 MG/ML IJ SOLN
0.5000 mg | INTRAMUSCULAR | Status: DC | PRN
  Administered 2018-09-29 – 2018-09-30 (×3): 1 mg via INTRAVENOUS
  Filled 2018-09-29 (×2): qty 1

## 2018-09-29 MED ORDER — DEXAMETHASONE SODIUM PHOSPHATE 10 MG/ML IJ SOLN
INTRAMUSCULAR | Status: DC | PRN
  Administered 2018-09-29: 10 mg via INTRAVENOUS

## 2018-09-29 MED ORDER — FENTANYL CITRATE (PF) 100 MCG/2ML IJ SOLN: 50.0000 ug | Freq: Once | INTRAMUSCULAR | Status: DC

## 2018-09-29 MED ORDER — 0.9 % SODIUM CHLORIDE (POUR BTL) OPTIME
TOPICAL | Status: DC | PRN
  Administered 2018-09-29: 1000 mL

## 2018-09-29 MED ORDER — DIPHENHYDRAMINE HCL 12.5 MG/5ML PO ELIX: 25.0000 mg | ORAL_SOLUTION | ORAL | Status: DC | PRN

## 2018-09-29 MED ORDER — OXYCODONE HCL ER 10 MG PO T12A: 10.0000 mg | EXTENDED_RELEASE_TABLET | Freq: Two times a day (BID) | ORAL | 0 refills | Status: AC

## 2018-09-29 MED ORDER — FENTANYL CITRATE (PF) 100 MCG/2ML IJ SOLN
INTRAMUSCULAR | Status: AC
  Administered 2018-09-29: 50 ug
  Filled 2018-09-29: qty 2

## 2018-09-29 MED ORDER — ONDANSETRON HCL 4 MG/2ML IJ SOLN
INTRAMUSCULAR | Status: DC | PRN
  Administered 2018-09-29: 4 mg via INTRAVENOUS

## 2018-09-29 MED ORDER — SENNOSIDES-DOCUSATE SODIUM 8.6-50 MG PO TABS: 1.0000 | ORAL_TABLET | Freq: Every evening | ORAL | 1 refills | Status: DC | PRN

## 2018-09-29 MED ORDER — ONDANSETRON HCL 4 MG/2ML IJ SOLN
INTRAMUSCULAR | Status: AC
  Filled 2018-09-29: qty 2

## 2018-09-29 MED ORDER — PROPOFOL 500 MG/50ML IV EMUL
INTRAVENOUS | Status: DC | PRN
  Administered 2018-09-29: 100 ug/kg/min via INTRAVENOUS

## 2018-09-29 MED ORDER — OXYCODONE HCL ER 10 MG PO T12A
10.0000 mg | EXTENDED_RELEASE_TABLET | Freq: Two times a day (BID) | ORAL | Status: DC
  Administered 2018-09-29 – 2018-10-01 (×4): 10 mg via ORAL
  Filled 2018-09-29 (×4): qty 1

## 2018-09-29 MED ORDER — ALUM & MAG HYDROXIDE-SIMETH 200-200-20 MG/5ML PO SUSP: 30.0000 mL | ORAL | Status: DC | PRN

## 2018-09-29 MED ORDER — BUPIVACAINE IN DEXTROSE 0.75-8.25 % IT SOLN
INTRATHECAL | Status: DC | PRN
  Administered 2018-09-29: 1.7 mL via INTRATHECAL

## 2018-09-29 MED ORDER — LIDOCAINE HCL (CARDIAC) PF 100 MG/5ML IV SOSY
PREFILLED_SYRINGE | INTRAVENOUS | Status: DC | PRN
  Administered 2018-09-29: 20 mg via INTRAVENOUS

## 2018-09-29 MED ORDER — LACTATED RINGERS IV SOLN
INTRAVENOUS | Status: DC
  Administered 2018-09-29 (×2): via INTRAVENOUS

## 2018-09-29 MED ORDER — KETOROLAC TROMETHAMINE 15 MG/ML IJ SOLN
30.0000 mg | Freq: Four times a day (QID) | INTRAMUSCULAR | Status: AC
  Administered 2018-09-29 – 2018-09-30 (×4): 30 mg via INTRAVENOUS
  Filled 2018-09-29 (×3): qty 2
  Filled 2018-09-29: qty 25

## 2018-09-29 MED ORDER — CEFAZOLIN SODIUM-DEXTROSE 2-4 GM/100ML-% IV SOLN
INTRAVENOUS | Status: AC
  Filled 2018-09-29: qty 100

## 2018-09-29 MED ORDER — BUPIVACAINE-EPINEPHRINE (PF) 0.5% -1:200000 IJ SOLN
INTRAMUSCULAR | Status: DC | PRN
  Administered 2018-09-29: 20 mL via PERINEURAL

## 2018-09-29 MED ORDER — METOCLOPRAMIDE HCL 5 MG PO TABS: 5.0000 mg | ORAL_TABLET | Freq: Three times a day (TID) | ORAL | Status: DC | PRN

## 2018-09-29 SURGICAL SUPPLY — 76 items
ALCOHOL ISOPROPYL (RUBBING) (MISCELLANEOUS) ×3 IMPLANT
BAG DECANTER FOR FLEXI CONT (MISCELLANEOUS) ×3 IMPLANT
BANDAGE ELASTIC 6 VELCRO ST LF (GAUZE/BANDAGES/DRESSINGS) ×2 IMPLANT
BANDAGE ESMARK 6X9 LF (GAUZE/BANDAGES/DRESSINGS) ×1 IMPLANT
BLADE SAW SGTL 13.0X1.19X90.0M (BLADE) ×3 IMPLANT
BNDG CMPR 9X6 STRL LF SNTH (GAUZE/BANDAGES/DRESSINGS) ×1
BNDG CMPR MED 10X6 ELC LF (GAUZE/BANDAGES/DRESSINGS) ×1
BNDG ELASTIC 6X10 VLCR STRL LF (GAUZE/BANDAGES/DRESSINGS) ×3 IMPLANT
BNDG ESMARK 6X9 LF (GAUZE/BANDAGES/DRESSINGS) ×3
BOWL SMART MIX CTS (DISPOSABLE) ×3 IMPLANT
BSPLAT TIB 3 KN TRITANIUM (Knees) ×1 IMPLANT
CLOSURE STERI-STRIP 1/2X4 (GAUZE/BANDAGES/DRESSINGS) ×1
CLSR STERI-STRIP ANTIMIC 1/2X4 (GAUZE/BANDAGES/DRESSINGS) ×3 IMPLANT
COMP POSTERIOR FEMORAL SZ4 LFT (Femur) ×3 IMPLANT
COMPONENT PSTRER FEMRL SZ4 LT (Femur) IMPLANT
COVER SURGICAL LIGHT HANDLE (MISCELLANEOUS) ×3 IMPLANT
COVER WAND RF STERILE (DRAPES) ×3 IMPLANT
CUFF TOURNIQUET SINGLE 34IN LL (TOURNIQUET CUFF) ×3 IMPLANT
CUFF TOURNIQUET SINGLE 44IN (TOURNIQUET CUFF) IMPLANT
DRAPE EXTREMITY T 121X128X90 (DRAPE) ×3 IMPLANT
DRAPE HALF SHEET 40X57 (DRAPES) ×3 IMPLANT
DRAPE INCISE IOBAN 66X45 STRL (DRAPES) IMPLANT
DRAPE ORTHO SPLIT 77X108 STRL (DRAPES) ×6
DRAPE POUCH INSTRU U-SHP 10X18 (DRAPES) ×3 IMPLANT
DRAPE SURG ORHT 6 SPLT 77X108 (DRAPES) ×2 IMPLANT
DRAPE U-SHAPE 47X51 STRL (DRAPES) ×6 IMPLANT
DURAPREP 26ML APPLICATOR (WOUND CARE) ×6 IMPLANT
ELECT CAUTERY BLADE 6.4 (BLADE) ×3 IMPLANT
ELECT REM PT RETURN 9FT ADLT (ELECTROSURGICAL) ×3
ELECTRODE REM PT RTRN 9FT ADLT (ELECTROSURGICAL) ×1 IMPLANT
GAUZE SPONGE 4X4 12PLY STRL LF (GAUZE/BANDAGES/DRESSINGS) ×3 IMPLANT
GLOVE BIOGEL PI IND STRL 7.0 (GLOVE) ×1 IMPLANT
GLOVE BIOGEL PI INDICATOR 7.0 (GLOVE) ×2
GLOVE ECLIPSE 7.0 STRL STRAW (GLOVE) ×9 IMPLANT
GLOVE SKINSENSE NS SZ7.5 (GLOVE) ×2
GLOVE SKINSENSE STRL SZ7.5 (GLOVE) ×1 IMPLANT
GLOVE SURG SYN 7.5  E (GLOVE) ×8
GLOVE SURG SYN 7.5 E (GLOVE) ×4 IMPLANT
GLOVE SURG SYN 7.5 PF PI (GLOVE) ×4 IMPLANT
GOWN STRL REIN XL XLG (GOWN DISPOSABLE) ×3 IMPLANT
GOWN STRL REUS W/ TWL LRG LVL3 (GOWN DISPOSABLE) ×1 IMPLANT
GOWN STRL REUS W/TWL LRG LVL3 (GOWN DISPOSABLE) ×3
HANDPIECE INTERPULSE COAX TIP (DISPOSABLE) ×3
HOOD PEEL AWAY FLYTE STAYCOOL (MISCELLANEOUS) ×6 IMPLANT
INSERT TIBIA BEARING PS 3 9MM (Insert) ×2 IMPLANT
KIT BASIN OR (CUSTOM PROCEDURE TRAY) ×3 IMPLANT
KIT TURNOVER KIT B (KITS) ×3 IMPLANT
KNEE PATELLA ASYMMETRIC 10X32 (Knees) ×2 IMPLANT
KNEE TIBIAL COMPONENT SZ3 (Knees) ×2 IMPLANT
MANIFOLD NEPTUNE II (INSTRUMENTS) ×3 IMPLANT
MARKER SKIN DUAL TIP RULER LAB (MISCELLANEOUS) ×3 IMPLANT
NDL SPNL 18GX3.5 QUINCKE PK (NEEDLE) ×2 IMPLANT
NEEDLE SPNL 18GX3.5 QUINCKE PK (NEEDLE) ×6 IMPLANT
NS IRRIG 1000ML POUR BTL (IV SOLUTION) ×3 IMPLANT
PACK TOTAL JOINT (CUSTOM PROCEDURE TRAY) ×3 IMPLANT
PAD ABD 8X10 STRL (GAUZE/BANDAGES/DRESSINGS) ×4 IMPLANT
PAD ARMBOARD 7.5X6 YLW CONV (MISCELLANEOUS) ×6 IMPLANT
PADDING CAST COTTON 6X4 STRL (CAST SUPPLIES) ×3 IMPLANT
SAW OSC TIP CART 19.5X105X1.3 (SAW) ×3 IMPLANT
SET HNDPC FAN SPRY TIP SCT (DISPOSABLE) ×1 IMPLANT
STAPLER VISISTAT 35W (STAPLE) IMPLANT
SUCTION FRAZIER HANDLE 10FR (MISCELLANEOUS) ×2
SUCTION TUBE FRAZIER 10FR DISP (MISCELLANEOUS) ×1 IMPLANT
SUT ETHILON 2 0 FS 18 (SUTURE) IMPLANT
SUT MNCRL AB 4-0 PS2 18 (SUTURE) IMPLANT
SUT VIC AB 0 CT1 27 (SUTURE) ×6
SUT VIC AB 0 CT1 27XBRD ANBCTR (SUTURE) ×2 IMPLANT
SUT VIC AB 1 CTX 27 (SUTURE) ×9 IMPLANT
SUT VIC AB 2-0 CT1 27 (SUTURE) ×12
SUT VIC AB 2-0 CT1 TAPERPNT 27 (SUTURE) ×4 IMPLANT
SYR 50ML LL SCALE MARK (SYRINGE) ×6 IMPLANT
TOWEL OR 17X24 6PK STRL BLUE (TOWEL DISPOSABLE) ×3 IMPLANT
TOWEL OR 17X26 10 PK STRL BLUE (TOWEL DISPOSABLE) ×3 IMPLANT
TRAY CATH 16FR W/PLASTIC CATH (SET/KITS/TRAYS/PACK) IMPLANT
UNDERPAD 30X30 (UNDERPADS AND DIAPERS) ×3 IMPLANT
WRAP KNEE MAXI GEL POST OP (GAUZE/BANDAGES/DRESSINGS) ×3 IMPLANT

## 2018-09-29 NOTE — Progress Notes (Signed)
Orthopedic Tech Progress Note Patient Details:  Latasha Hicks Jan 31, 1961 [id number]  CPM Left Knee CPM Left Knee: On Left Knee Flexion (Degrees): 90 Left Knee Extension (Degrees): 0  Post Interventions Patient Tolerated: Well Instructions Provided: Care of device Pt's bed will not accept ohf due to [place] rn NOTIFIED Hildred Priest 09/29/2018, 3:31 PM

## 2018-09-29 NOTE — Anesthesia Procedure Notes (Signed)
Anesthesia Regional Block: Adductor canal block   Pre-Anesthetic Checklist: ,, timeout performed, Correct Patient, Correct Site, Correct Laterality, Correct Procedure, Correct Position, site marked, Risks and benefits discussed, pre-op evaluation,  At surgeon's request and post-op pain management  Laterality: Left  Prep: Maximum Sterile Barrier Precautions used, chloraprep       Needles:  Injection technique: Single-shot  Needle Type: Echogenic Stimulator Needle     Needle Length: 9cm  Needle Gauge: 22     Additional Needles:   Procedures:,,,, ultrasound used (permanent image in chart),,,,  Narrative:  Start time: 09/29/2018 11:34 AM End time: 09/29/2018 11:37 AM Injection made incrementally with aspirations every 5 mL.  Performed by: Personally  Anesthesiologist: Brennan Bailey, MD  Additional Notes: Risks, benefits, and alternative discussed. Patient gave consent for procedure. Patient prepped and draped in sterile fashion. Sedation administered, patient remains easily responsive to voice. Relevant anatomy identified with ultrasound guidance. Local anesthetic given in 5cc increments with no signs or symptoms of intravascular injection. No pain or paraesthesias with injection. Patient monitored throughout procedure with signs of LAST or immediate complications. Tolerated well. Ultrasound image placed in chart.  Tawny Asal, MD

## 2018-09-29 NOTE — Anesthesia Procedure Notes (Signed)
Spinal  Patient location during procedure: OR Start time: 09/29/2018 12:28 PM End time: 09/29/2018 12:31 PM Staffing Anesthesiologist: Brennan Bailey, MD Performed: anesthesiologist  Preanesthetic Checklist Completed: patient identified, surgical consent, pre-op evaluation, timeout performed, IV checked, risks and benefits discussed and monitors and equipment checked Spinal Block Patient position: sitting Prep: site prepped and draped and DuraPrep Patient monitoring: cardiac monitor, continuous pulse ox and blood pressure Approach: midline Location: L3-4 Injection technique: single-shot Needle Needle type: Pencan  Needle gauge: 24 G Needle length: 9 cm Additional Notes Risks, benefits, and alternative discussed. Patient gave consent to procedure. Prepped and draped in sitting position. Clear CSF obtained after one needle pass. Positive terminal aspiration. No pain or paraesthesias with injection. Patient tolerated procedure well. Vital signs stable. Tawny Asal, MD

## 2018-09-29 NOTE — Anesthesia Preprocedure Evaluation (Addendum)
Anesthesia Evaluation  Patient identified by MRN, date of birth, ID band Patient awake    Reviewed: Allergy & Precautions, NPO status , Patient's Chart, lab work & pertinent test results  History of Anesthesia Complications Negative for: history of anesthetic complications  Airway Mallampati: II  TM Distance: >3 FB Neck ROM: Full    Dental no notable dental hx. (+) Teeth Intact   Pulmonary sleep apnea ,    Pulmonary exam normal breath sounds clear to auscultation       Cardiovascular negative cardio ROS Normal cardiovascular exam Rhythm:Regular Rate:Normal     Neuro/Psych negative neurological ROS     GI/Hepatic Neg liver ROS, Hx of lap band surgery   Endo/Other  negative endocrine ROS  Renal/GU negative Renal ROS     Musculoskeletal  (+) Arthritis , Osteoarthritis,    Abdominal   Peds  Hematology negative hematology ROS (+)   Anesthesia Other Findings Day of surgery medications reviewed with the patient.  Reproductive/Obstetrics                            Anesthesia Physical Anesthesia Plan  ASA: II  Anesthesia Plan: Spinal   Post-op Pain Management:  Regional for Post-op pain   Induction:   PONV Risk Score and Plan: 2 and Treatment may vary due to age or medical condition, Ondansetron and Propofol infusion  Airway Management Planned: Natural Airway and Nasal Cannula  Additional Equipment:   Intra-op Plan:   Post-operative Plan:   Informed Consent: I have reviewed the patients History and Physical, chart, labs and discussed the procedure including the risks, benefits and alternatives for the proposed anesthesia with the patient or authorized representative who has indicated his/her understanding and acceptance.     Plan Discussed with: CRNA  Anesthesia Plan Comments: (Spinal + adductor canal block)       Anesthesia Quick Evaluation

## 2018-09-29 NOTE — H&P (Signed)
PREOPERATIVE H&P  Chief Complaint: left knee degenerative joint disease  HPI: Latasha Hicks is a 57 y.o. female who presents for surgical treatment of left knee degenerative joint disease.  She denies any changes in medical history.  Past Medical History:  Diagnosis Date  . Allergic rhinitis   . Complication of anesthesia    "hard to wake up in past"  . Endometriosis   . Hemorrhoids   . History of kidney stones   . Hydronephrosis   . Kidney stones   . Obstructive sleep apnea    used CPAP in past, no longer needed after bariatric surgery  . Osteoarthritis   . Perianal abscess 05/01/2013   6/14 7 o'clock   . Personal history of colonic polyps    Question accuracy, patient report only.  . Urinary incontinence   . VENOUS INSUFFICIENCY, CHRONIC 07/05/2007   Annotation: LE Qualifier: Diagnosis of  By: Dance CMA (Chupadero), Kim    . Vitamin D deficiency    Past Surgical History:  Procedure Laterality Date  . ABDOMINAL SURGERY    . ACHILLES TENDON SURGERY Left 12/2017  . COLONOSCOPY N/A 09/04/2013   Procedure: COLONOSCOPY;  Surgeon: Inda Castle, MD;  Location: [facility];  Service: Endoscopy;  Laterality: N/A;  . COLONOSCOPY W/ POLYPECTOMY    . DILATION AND CURETTAGE OF UTERUS    . HERNIA REPAIR    . HIATAL HERNIA REPAIR    . LAPAROSCOPIC CHOLECYSTECTOMY    . LAPAROSCOPIC GASTRIC BANDING    . VAGINAL HYSTERECTOMY     Social History   Socioeconomic History  . Marital status: Married    Spouse name: Not on file  . Number of children: Not on file  . Years of education: Not on file  . Highest education level: Not on file  Occupational History  . Not on file  Social Needs  . Financial resource strain: Not on file  . Food insecurity:    Worry: Not on file    Inability: Not on file  . Transportation needs:    Medical: Not on file    Non-medical: Not on file  Tobacco Use  . Smoking status: Never Smoker  . Smokeless tobacco: Never Used  Substance and Sexual  Activity  . Alcohol use: Yes    Comment: rarely  . Drug use: No  . Sexual activity: Yes    Birth control/protection: Post-menopausal  Lifestyle  . Physical activity:    Days per week: Not on file    Minutes per session: Not on file  . Stress: Not on file  Relationships  . Social connections:    Talks on phone: Not on file    Gets together: Not on file    Attends religious service: Not on file    Active member of club or organization: Not on file    Attends meetings of clubs or organizations: Not on file    Relationship status: Not on file  Other Topics Concern  . 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 Diabetes Mother   . Cervical cancer Mother   . Hypertension Mother    Allergies  Allergen Reactions  . Clarithromycin     REACTION: gi upset  . Tetanus Toxoids Other (See Comments)    Right arm large erythem reaction spontaneously improved   Prior  to Admission medications   Medication Sig Start Date End Date Taking? Authorizing Provider  b complex vitamins tablet Take 1 tablet by mouth daily. 06/10/15  Yes Plotnikov, Evie Lacks, MD  Cholecalciferol (VITAMIN D3) 2000 UNITS capsule Take 1 capsule (2,000 Units total) by mouth daily. 06/10/15  Yes Plotnikov, Evie Lacks, MD  Cholecalciferol (VITAMIN D3) 50000 units CAPS Take 1 capsule by mouth once a week. 08/31/18  Yes Plotnikov, Evie Lacks, MD  diclofenac sodium (VOLTAREN) 1 % GEL Apply 2 g topically 4 (four) times daily. Patient taking differently: Apply 2 g topically daily as needed (pain).  07/08/18  Yes Aundra Dubin, PA-C  ketorolac (TORADOL) 10 MG tablet Take 1 tablet (10 mg total) by mouth every 6 (six) hours as needed. For renal colic Patient not taking: Reported on 09/18/2018 01/01/18   Plotnikov, Evie Lacks, MD     Positive ROS: All other systems have been reviewed and were otherwise negative  with the exception of those mentioned in the HPI and as above.  Physical Exam: General: Alert, no acute distress Cardiovascular: No pedal edema Respiratory: No cyanosis, no use of accessory musculature GI: abdomen soft Skin: No lesions in the area of chief complaint Neurologic: Sensation intact distally Psychiatric: Patient is competent for consent with normal mood and affect Lymphatic: no lymphedema  MUSCULOSKELETAL: exam stable  Assessment: left knee degenerative joint disease  Plan: Plan for Procedure(s): LEFT TOTAL KNEE ARTHROPLASTY  The risks benefits and alternatives were discussed with the patient including but not limited to the risks of nonoperative treatment, versus surgical intervention including infection, bleeding, nerve injury,  blood clots, cardiopulmonary complications, morbidity, mortality, among others, and they were willing to proceed.   Preoperative templating of the joint replacement has been completed, documented, and submitted to the Operating Room personnel in order to optimize intra-operative equipment management.  Patient's anticipated LOS is less than 2 midnights, meeting these requirements: - Younger than 41 - Lives within 1 hour of care - Has a competent adult at home to recover with post-op recover - NO history of  - Chronic pain requiring opiods  - Diabetes  - Coronary Artery Disease  - Heart failure  - Heart attack  - Stroke  - DVT/VTE  - Cardiac arrhythmia  - Respiratory Failure/COPD  - Renal failure  - Anemia  - Advanced Liver disease  [name], MD   09/29/2018 7:18 AM

## 2018-09-29 NOTE — Anesthesia Postprocedure Evaluation (Signed)
Anesthesia Post Note  Patient: Latasha Hicks  Procedure(s) Performed: LEFT TOTAL KNEE ARTHROPLASTY (Left Knee)     Patient location during evaluation: PACU Anesthesia Type: Regional and Spinal Level of consciousness: oriented and awake and alert Pain management: pain level controlled Vital Signs Assessment: post-procedure vital signs reviewed and stable Respiratory status: spontaneous breathing, respiratory function stable and patient connected to nasal cannula oxygen Cardiovascular status: blood pressure returned to baseline and stable Postop Assessment: no headache, no backache, no apparent nausea or vomiting and spinal receding Anesthetic complications: no    Last Vitals:  Vitals:   09/29/18 2008 09/29/18 2040  BP: (!) 117/56 131/83  Pulse: 74 65  Resp: 16 18  Temp:  36.5 C  SpO2: 98% 97%    Last Pain:  Vitals:   09/29/18 2040  TempSrc: Oral  PainSc:                  Asheley Hellberg P Sabina Beavers

## 2018-09-29 NOTE — Transfer of Care (Signed)
Immediate Anesthesia Transfer of Care Note  Patient: Samuel Bouche  Procedure(s) Performed: LEFT TOTAL KNEE ARTHROPLASTY (Left Knee)  Patient Location: PACU  Anesthesia Type:MAC, Regional and Spinal  Level of Consciousness: awake, alert , oriented and patient cooperative  Airway & Oxygen Therapy: Patient Spontanous Breathing  Post-op Assessment: Report given to RN and Post -op Vital signs reviewed and stable  Post vital signs: Reviewed and stable  Last Vitals:  Vitals Value Taken Time  BP 99/51 09/29/2018  2:40 PM  Temp    Pulse 55 09/29/2018  2:43 PM  Resp 14 09/29/2018  2:43 PM  SpO2 99 % 09/29/2018  2:43 PM  Vitals shown include unvalidated device data.  Last Pain:  Vitals:   09/29/18 1150  TempSrc:   PainSc: 0-No pain      Patients Stated Pain Goal: 5 (69/67/89 3810)  Complications: No apparent anesthesia complications

## 2018-09-29 NOTE — Discharge Instructions (Signed)

## 2018-09-29 NOTE — Op Note (Signed)
Total Knee Arthroplasty Procedure Note  Preoperative diagnosis: Left knee osteoarthritis  Postoperative diagnosis:same  Operative procedure: Left total knee arthroplasty. CPT [phone]  Surgeon: N. [name], MD  Assist: Madalyn Rob, PA-C; necessary for the timely completion of procedure and due to complexity of procedure.  Anesthesia: Spinal, regional  Tourniquet time: 60 mins  Implants used: [employer] Triatholon Uncemented Femur: PS 4 Tibia: 3  Patella: 32 mm Polyethylene: 9 mm  Indication: Latasha Hicks is a 57 y.o. year old female with a history of knee pain. Having failed conservative management, the patient elected to proceed with a total knee arthroplasty.  We have reviewed the risk and benefits of the surgery and they elected to proceed after voicing understanding.  Procedure:  After informed consent was obtained and understanding of the risk were voiced including but not limited to bleeding, infection, damage to surrounding structures including nerves and vessels, blood clots, leg length inequality and the failure to achieve desired results, the operative extremity was marked with verbal confirmation of the patient in the holding area.   The patient was then brought to the operating room and transported to the operating room table in the supine position.  A tourniquet was applied to the operative extremity around the upper thigh. The operative limb was then prepped and draped in the usual sterile fashion and preoperative antibiotics were administered.  A time out was performed prior to the start of surgery confirming the correct extremity, preoperative antibiotic administration, as well as team members, implants and instruments available for the case. Correct surgical site was also confirmed with preoperative radiographs. The limb was then elevated for exsanguination and the tourniquet was inflated. A midline incision was made and a standard medial parapatellar  approach was performed.  The patella was prepared and sized to a 32 mm.  A cover was placed on the patella for protection from retractors.  We then turned our attention to the femur. Posterior cruciate ligament was sacrificed. Start site was drilled in the femur and the intramedullary distal femoral cutting guide was placed, set at 5 degrees valgus, taking 13 mm of distal resection. The distal cut was made. Osteophytes were then removed. Next, the proximal tibial cutting guide was placed with appropriate slope, varus/valgus alignment and depth of resection. The proximal tibial cut was made. Gap blocks were then used to assess the extension gap and alignment, and appropriate soft tissue releases were performed. Attention was turned back to the femur, which was sized using the sizing guide to a size 4. Appropriate rotation of the femoral component was determined using epicondylar axis, Whiteside's line, and assessing the flexion gap under ligament tension. The appropriate size 4-in-1 cutting block was placed and cuts were made. Posterior femoral osteophytes and uncapped bone were then removed with the curved osteotome. The tibia was sized for a size 3 component. The femoral box-cutting guide was placed and prepared for a PS femoral component. Trial components were placed, and stability was checked in full extension, mid-flexion, and deep flexion. Proper tibial rotation was determined and marked.  The patella tracked well without a lateral release. Trial components were then removed and tibial preparation performed. A posterior capsular injection comprising of 20 cc of 1.3% exparel and 40 cc of normal saline was performed for postoperative pain control. The bony surfaces were irrigated with a pulse lavage and then dried. The final components sized above were malleted into place. The stability of the construct was re-evaluated throughout a range of motion  and found to be acceptable. The trial liner was removed, the  knee was copiously irrigated, and the knee was re-evaluated for any excess bone debris. The real polyethylene liner, 9 mm thick, was inserted and checked to ensure the locking mechanism had engaged appropriately. The tourniquet was deflated and hemostasis was achieved. The wound was irrigated with normal saline.  One gram of vancomycin powder was placed in the surgical bed.  A drain was not placed.  Capsular closure was performed with a #1 vicryl, subcutaneous fat closed with a 0 vicryl suture, then subcutaneous tissue closed with interrupted 2.0 vicryl suture. The skin was then closed with a 3.0 monocryl. A sterile dressing was applied.  The patient was awakened in the operating room and taken to recovery in stable condition. All sponge, needle, and instrument counts were correct at the end of the case.  Position: supine  Complications: none.  Time Out: performed   Drains/Packing: none  Estimated blood loss: minimal  Returned to Recovery Room: in good condition.   Antibiotics: yes   Mechanical VTE (DVT) Prophylaxis: sequential compression devices, TED thigh-high  Chemical VTE (DVT) Prophylaxis: aspirin  Fluid Replacement  Crystalloid: see anesthesia record Blood: none  FFP: none   Specimens Removed: 1 to pathology   Sponge and Instrument Count Correct? yes   PACU: portable radiograph - knee AP and Lateral   Admission: inpatient status  Plan/RTC: Return in 2 weeks for wound check.   Weight Bearing/Load Lower Extremity: full   N. [name], MD [place] 1:55 PM

## 2018-09-30 ENCOUNTER — Other Ambulatory Visit: Payer: Self-pay

## 2018-09-30 ENCOUNTER — Encounter (HOSPITAL_COMMUNITY): Payer: Self-pay

## 2018-09-30 DIAGNOSIS — M1712 Unilateral primary osteoarthritis, left knee: Secondary | ICD-10-CM | POA: Diagnosis present

## 2018-09-30 DIAGNOSIS — Z9049 Acquired absence of other specified parts of digestive tract: Secondary | ICD-10-CM | POA: Diagnosis not present

## 2018-09-30 DIAGNOSIS — Z887 Allergy status to serum and vaccine status: Secondary | ICD-10-CM | POA: Diagnosis not present

## 2018-09-30 DIAGNOSIS — Z8719 Personal history of other diseases of the digestive system: Secondary | ICD-10-CM | POA: Diagnosis not present

## 2018-09-30 DIAGNOSIS — Z8049 Family history of malignant neoplasm of other genital organs: Secondary | ICD-10-CM | POA: Diagnosis not present

## 2018-09-30 DIAGNOSIS — E669 Obesity, unspecified: Secondary | ICD-10-CM | POA: Diagnosis present

## 2018-09-30 DIAGNOSIS — J309 Allergic rhinitis, unspecified: Secondary | ICD-10-CM | POA: Diagnosis present

## 2018-09-30 DIAGNOSIS — E559 Vitamin D deficiency, unspecified: Secondary | ICD-10-CM | POA: Diagnosis present

## 2018-09-30 DIAGNOSIS — G4733 Obstructive sleep apnea (adult) (pediatric): Secondary | ICD-10-CM | POA: Diagnosis present

## 2018-09-30 DIAGNOSIS — Z90710 Acquired absence of both cervix and uterus: Secondary | ICD-10-CM | POA: Diagnosis not present

## 2018-09-30 DIAGNOSIS — Z6837 Body mass index (BMI) 37.0-37.9, adult: Secondary | ICD-10-CM | POA: Diagnosis not present

## 2018-09-30 DIAGNOSIS — Z833 Family history of diabetes mellitus: Secondary | ICD-10-CM | POA: Diagnosis not present

## 2018-09-30 DIAGNOSIS — M25562 Pain in left knee: Secondary | ICD-10-CM | POA: Diagnosis present

## 2018-09-30 DIAGNOSIS — Z8249 Family history of ischemic heart disease and other diseases of the circulatory system: Secondary | ICD-10-CM | POA: Diagnosis not present

## 2018-09-30 DIAGNOSIS — Z87442 Personal history of urinary calculi: Secondary | ICD-10-CM | POA: Diagnosis not present

## 2018-09-30 DIAGNOSIS — Z881 Allergy status to other antibiotic agents status: Secondary | ICD-10-CM | POA: Diagnosis not present

## 2018-09-30 LAB — BASIC METABOLIC PANEL
Anion gap: 6 (ref 5–15)
BUN: 13 mg/dL (ref 6–20)
CALCIUM: 8.9 mg/dL (ref 8.9–10.3)
CO2: 22 mmol/L (ref 22–32)
CREATININE: 0.81 mg/dL (ref 0.44–1.00)
Chloride: 109 mmol/L (ref 98–111)
GFR calc Af Amer: >60 mL/min (ref >60)
GFR calc non Af Amer: >60 mL/min (ref >60)
GLUCOSE: 155 mg/dL — AB (ref 70–99)
Potassium: 4.5 mmol/L (ref 3.5–5.1)
SODIUM: 137 mmol/L (ref 135–145)

## 2018-09-30 LAB — CBC
HCT: 43.6 % (ref 36.0–46.0)
Hemoglobin: 14.1 g/dL (ref 12.0–15.0)
MCH: 30.5 pg (ref 26.0–34.0)
MCHC: 32.3 g/dL (ref 30.0–36.0)
MCV: 94.2 fL (ref 80.0–100.0)
PLATELETS: 241 10*3/uL (ref 150–400)
RBC: 4.63 MIL/uL (ref 3.87–5.11)
RDW: 13 % (ref 11.5–15.5)
WBC: 10.6 10*3/uL — ABNORMAL HIGH (ref 4.0–10.5)
nRBC: 0 % (ref 0.0–0.2)

## 2018-09-30 NOTE — Progress Notes (Signed)
Physical Therapy Treatment Patient Details Name: Latasha Hicks MRN: [id number] DOB: Jan 04, 1961 Today's Date: 09/30/2018    History of Present Illness Admitted for LTKA, [name];  has a past medical history of Allergic rhinitis, Complication of anesthesia, Endometriosis, Hemorrhoids, History of kidney stones, Hydronephrosis, Kidney stones, Obstructive sleep apnea, Osteoarthritis, Perianal abscess (05/01/2013), Personal history of colonic polyps, Urinary incontinence, VENOUS INSUFFICIENCY, CHRONIC (07/05/2007), and Vitamin D deficiency.    PT Comments    Continuing work on functional mobility and activity tolerance;  Excellent improvements in activity tolerance compared to am session; will plan for stair training in the morning; on track for dc tomorrow   Follow Up Recommendations  Follow surgeon's recommendation for DC plan and follow-up therapies;Supervision/Assistance - 24 hour     Equipment Recommendations  Rolling walker with 5" wheels;3in1 (PT)    Recommendations for Other Services       Precautions / Restrictions Precautions Precautions: Knee;Fall Precaution Booklet Issued: Yes (comment) Restrictions LLE Weight Bearing: Weight bearing as tolerated    Mobility  Bed Mobility Overal bed mobility: Needs Assistance Bed Mobility: Supine to Sit     Supine to sit: Supervision     General bed mobility comments: Cues for tecnqiue and to self monitor for activity tolerance  Transfers Overall transfer level: Needs assistance Equipment used: Rolling walker (2 wheeled) Transfers: Sit to/from Stand Sit to Stand: Min guard         General transfer comment: Cues for hand placement and safety  Ambulation/Gait Ambulation/Gait assistance: Min guard Gait Distance (Feet): 250 Feet Assistive device: Rolling walker (2 wheeled) Gait Pattern/deviations: Step-through pattern(emerging)     General Gait Details: Cues to activate L quad in stance for stability; no buckling noted; cues  to self-monitor for activity tolerance   Stairs             Wheelchair Mobility    Modified Rankin (Stroke Patients Only)       Balance                                            Cognition Arousal/Alertness: Awake/alert Behavior During Therapy: WFL for tasks assessed/performed Overall Cognitive Status: Within Functional Limits for tasks assessed                                        Exercises Total Joint Exercises Ankle Circles/Pumps: AROM;Both;20 reps Quad Sets: AROM;Left;10 reps Short Arc Quad: AROM;Left;10 reps Heel Slides: AROM;AAROM;Left;10 reps Hip ABduction/ADduction: AROM;Left;10 reps Straight Leg Raises: AROM;Left;10 reps    General Comments        Pertinent Vitals/Pain Pain Assessment: 0-10 Pain Score: 5  Pain Location: L knee with weight bearing Pain Descriptors / Indicators: Grimacing Pain Intervention(s): Monitored during session    Home Living                      Prior Function            PT Goals (current goals can now be found in the care plan section) Acute Rehab PT Goals Patient Stated Goal: walk without pain PT Goal Formulation: With patient Time For Goal Achievement: 10/14/18 Potential to Achieve Goals: Good Progress towards PT goals: Progressing toward goals    Frequency    7X/week      PT  Plan Current plan remains appropriate    Co-evaluation              AM-PAC PT "6 Clicks" Daily Activity  Outcome Measure  Difficulty turning over in bed (including adjusting bedclothes, sheets and blankets)?: A Little Difficulty moving from lying on back to sitting on the side of the bed? : A Little Difficulty sitting down on and standing up from a chair with arms (e.g., wheelchair, bedside commode, etc,.)?: A Little Help needed moving to and from a bed to chair (including a wheelchair)?: None Help needed walking in hospital room?: A Little Help needed climbing 3-5 steps with a  railing? : A Little 6 Click Score: 19    End of Session Equipment Utilized During Treatment: Gait belt Activity Tolerance: Patient tolerated treatment well Patient left: in chair;with call bell/phone within reach Nurse Communication: Mobility status PT Visit Diagnosis: Unsteadiness on feet (R26.81);Pain Pain - Right/Left: Left Pain - part of body: Knee     Time: 1400(start time is approx)-1430 PT Time Calculation (min) (ACUTE ONLY): 30 min  Charges:  $Gait Training: 8-22 mins $Therapeutic Exercise: 8-22 mins                     Roney Marion, PT  Acute Rehabilitation Services Pager [phone] Office [place] 09/30/2018, 3:43 PM

## 2018-09-30 NOTE — Discharge Summary (Addendum)
Patient ID: Latasha Hicks MRN: [id number] DOB/AGE: August 21, 1961 57 y.o.  Admit date: 09/29/2018 Discharge date: 10/01/2018  Admission Diagnoses:  Principal Problem:   Primary osteoarthritis of left knee Active Problems:   Total knee replacement status   Discharge Diagnoses:  Same  Past Medical History:  Diagnosis Date  . Allergic rhinitis   . Complication of anesthesia    "hard to wake up in past"  . Endometriosis   . Hemorrhoids   . History of kidney stones   . Hydronephrosis   . Kidney stones   . Obstructive sleep apnea    used CPAP in past, no longer needed after bariatric surgery  . Osteoarthritis   . Perianal abscess 05/01/2013   6/14 7 o'clock   . Personal history of colonic polyps    Question accuracy, patient report only.  . Urinary incontinence   . VENOUS INSUFFICIENCY, CHRONIC 07/05/2007   Annotation: LE Qualifier: Diagnosis of  By: Dance CMA ([place]), Kim    . Vitamin D deficiency     Surgeries: Procedure(s): LEFT TOTAL KNEE ARTHROPLASTY on 09/29/2018   Consultants:   Discharged Condition: Improved  Hospital Course: Latasha Hicks is an 57 y.o. female who was admitted 09/29/2018 for operative treatment ofPrimary osteoarthritis of left knee. Patient has severe unremitting pain that affects sleep, daily activities, and work/hobbies. After pre-op clearance the patient was taken to the operating room on 09/29/2018 and underwent  Procedure(s): LEFT TOTAL KNEE ARTHROPLASTY.    Patient was given perioperative antibiotics:  Anti-infectives (From admission, onward)   Start     Dose/Rate Route Frequency Ordered Stop   09/29/18 2100  ceFAZolin (ANCEF) IVPB 2g/100 mL premix     2 g 200 mL/hr over 30 Minutes Intravenous Every 6 hours 09/29/18 2042 09/30/18 0944   09/29/18 1030  ceFAZolin (ANCEF) IVPB 2g/100 mL premix     2 g 200 mL/hr over 30 Minutes Intravenous On call to O.R. 09/29/18 1017 09/29/18 1234   09/29/18 1025  ceFAZolin (ANCEF) 2-4 GM/100ML-% IVPB     Note to Pharmacy:  Nyoka Cowden   : cabinet override      09/29/18 1025 09/29/18 1234       Patient was given sequential compression devices, early ambulation, and chemoprophylaxis to prevent DVT.  Patient benefited maximally from hospital stay and there were no complications.    Recent vital signs:  Patient Vitals for the past 24 hrs:  BP Temp Temp src Pulse Resp SpO2  10/01/18 0452 114/71 (!) 97.5 F (36.4 C) Oral 60 18 99 %  09/30/18 2129 (!) 141/84 97.6 F (36.4 C) Oral (!) 50 16 93 %  09/30/18 0944 123/80 97.8 F (36.6 C) Axillary (!) 50 18 98 %     Recent laboratory studies:  Recent Labs    09/30/18 0311  WBC 10.6*  HGB 14.1  HCT 43.6  PLT 241  NA 137  K 4.5  CL 109  CO2 22  BUN 13  CREATININE 0.81  GLUCOSE 155*  CALCIUM 8.9     Discharge Medications:   Allergies as of 10/01/2018      Reactions   Clarithromycin    REACTION: gi upset   Tetanus Toxoids Other (See Comments)   Right arm large erythem reaction spontaneously improved      Medication List    STOP taking these medications   diclofenac sodium 1 % Gel Commonly known as:  VOLTAREN   ketorolac 10 MG tablet Commonly known as:  TORADOL  TAKE these medications   aspirin EC 81 MG tablet Take 1 tablet (81 mg total) by mouth 2 (two) times daily.   b complex vitamins tablet Take 1 tablet by mouth daily.   methocarbamol 750 MG tablet Commonly known as:  ROBAXIN Take 1 tablet (750 mg total) by mouth 2 (two) times daily as needed for muscle spasms.   ondansetron 4 MG tablet Commonly known as:  ZOFRAN Take 1-2 tablets (4-8 mg total) by mouth every 8 (eight) hours as needed for nausea or vomiting.   oxyCODONE 5 MG immediate release tablet Commonly known as:  Oxy IR/ROXICODONE Take 1-3 tablets (5-15 mg total) by mouth every 4 (four) hours as needed.   oxyCODONE 10 mg 12 hr tablet Commonly known as:  OXYCONTIN Take 1 tablet (10 mg total) by mouth every 12 (twelve) hours for 3 days.    promethazine 25 MG tablet Commonly known as:  PHENERGAN Take 1 tablet (25 mg total) by mouth every 6 (six) hours as needed for nausea.   senna-docusate 8.6-50 MG tablet Commonly known as:  Senokot-S Take 1 tablet by mouth at bedtime as needed.   Vitamin D3 50 MCG (2000 UT) capsule Take 1 capsule (2,000 Units total) by mouth daily.   Vitamin D3 1.25 MG (50000 UT) Caps Take 1 capsule by mouth once a week.            Durable Medical Equipment  (From admission, onward)         Start     Ordered   09/29/18 2042  DME Walker rolling  Once    Question:  Patient needs a walker to treat with the following condition  Answer:  Total knee replacement status   09/29/18 2042   09/29/18 2042  DME 3 n 1  Once     09/29/18 2042   09/29/18 2042  DME Bedside commode  Once    Question:  Patient needs a bedside commode to treat with the following condition  Answer:  Total knee replacement status   09/29/18 2042          Diagnostic Studies: Dg Chest 2 View  Result Date: 09/19/2018 CLINICAL DATA:  Left knee replacement, preop exam EXAM: CHEST - 2 VIEW COMPARISON:  01/07/2008 FINDINGS: The heart size and mediastinal contours are within normal limits. Both lungs are clear. The visualized skeletal structures are unremarkable. Normal heart size and vascularity. Trachea is midline. Degenerative spondylosis of the spine. Gastric banding noted. IMPRESSION: No active cardiopulmonary disease. Electronically Signed   By: Jerilynn Mages.  Shick M.D.   On: 09/19/2018 15:33   Mr Ankle Left W/o Contrast  Result Date: 09/17/2018 CLINICAL DATA:  Ankle pain, posterior tibial dysfunction. Prior chili surgery. EXAM: MRI OF THE LEFT ANKLE WITHOUT CONTRAST TECHNIQUE: Multiplanar, multisequence MR imaging of the ankle was performed. No intravenous contrast was administered. COMPARISON:  None. FINDINGS: TENDONS Peroneal: Peroneal longus tendon intact. Peroneal brevis intact. Posteromedial: Mild tendinosis of the posterior  tibial tendon at the navicular insertion with mild tenosynovitis. Flexor hallucis longus tendon intact. Flexor digitorum longus tendon intact. Anterior: Tibialis anterior tendon intact. Extensor hallucis longus tendon intact Extensor digitorum longus tendon intact. Achilles: Severe thickening of the distal Achilles tendon at its insertion most consistent with postsurgical changes with mild peritenonitis. Superimposed mild tendinosis. Plantar Fascia: Intact. LIGAMENTS Lateral: Anterior talofibular ligament intact. Calcaneofibular ligament intact. Posterior talofibular ligament intact. Anterior and posterior tibiofibular ligaments intact. Medial: Deltoid ligament intact. Spring ligament intact. CARTILAGE Ankle Joint: No joint effusion. Normal  ankle mortise. No chondral defect. Subtalar Joints/Sinus Tarsi: Normal subtalar joints. No subtalar joint effusion. Normal sinus tarsi. Bones: No marrow signal abnormality. No fracture or dislocation. Mild osteoarthritis of the talonavicular joint. Soft Tissue: No fluid collection or hematoma.  No soft tissue mass. IMPRESSION: 1. Mild tendinosis of the posterior tibial tendon at the navicular insertion with mild tenosynovitis. 2. Severe thickening of the distal Achilles tendon at its insertion most consistent with postsurgical changes with mild persistent tendinosis and peritenonitis. Electronically Signed   By: Kathreen Devoid   On: 09/17/2018 08:36   Dg Knee Left Port  Result Date: 09/29/2018 CLINICAL DATA:  Status post left knee replacement today. EXAM: PORTABLE LEFT KNEE - 1-2 VIEW COMPARISON:  None. FINDINGS: Left knee arthroplasty is in place. No acute abnormality is identified. Gas in the soft tissues from surgery noted. IMPRESSION: Status post left knee replacement.  No acute abnormality. Electronically Signed   By: Inge Rise M.D.   On: 09/29/2018 15:19   Xr Ankle Complete Left  Result Date: 09/02/2018 Mild periarticular spurring of the medial and lateral  gutter   Disposition: Discharge disposition: 01-Home or [place]    Leandrew Koyanagi, MD In 2 weeks.   Specialty:  Orthopedic Surgery Why:  For suture removal, For wound re-check Contact information: [address] [place] [id number] [phone]        [facility], [facility] up.   Specialty:  [place] Why:  [place] will call to arrange initial visit Contact information: [address] [place] [address] [phone]            Signed: Aundra Dubin 10/01/2018, 7:44 AM

## 2018-09-30 NOTE — Plan of Care (Signed)
  Problem: Pain Management: Goal: Pain level will decrease with appropriate interventions Outcome: Progressing   

## 2018-09-30 NOTE — Care Management Note (Addendum)
Case Management Note  Patient Details  Name: Latasha Hicks MRN: [id number] Date of Birth: [birth year]  Subjective/Objective:             L TKA       Action/Plan: NCM spoke to pt and husband, Latasha Hicks. Pt will need RW and 3n1 bedside commode for home. Contacted [facility] for DME for home, to be delivered to room prior to dc. Offered choice for [facility]. Pt agreeable to [facility] for [facility]. (preoperatively arranged with [facility] from surgeon's office)   Expected Discharge Date:  09/30/18               Expected Discharge Plan:  [place]  In-House Referral:  NA  Discharge planning Services  CM Consult  Post Acute Care Choice:  Home Health Choice offered to:  Patient, Spouse  DME Arranged:  3-N-1, Walker rolling DME Agency:  [place]:  PT [place] Agency:  [facility]  Status of Service:  Completed, signed off  If discussed at [employer], dates discussed:    Additional Comments:  Erenest Rasher, RN 09/30/2018, 10:42 AM

## 2018-09-30 NOTE — Progress Notes (Signed)
CSW acknowledges SNF consult however patient will be discharging home with home health, RNCM meeting family equipment and home health needs currently.   Please notify with any future CW needs.   CSW signing off.   [place], [place]

## 2018-09-30 NOTE — Evaluation (Signed)
Physical Therapy Evaluation Patient Details Name: Latasha Hicks MRN: [id number] DOB: 10/23/1961 Today's Date: 09/30/2018   History of Present Illness  Admitted for LTKA, Latasha Hicks;  has a past medical history of Allergic rhinitis, Complication of anesthesia, Endometriosis, Hemorrhoids, History of kidney stones, Hydronephrosis, Kidney stones, Obstructive sleep apnea, Osteoarthritis, Perianal abscess (05/01/2013), Personal history of colonic polyps, Urinary incontinence, VENOUS INSUFFICIENCY, CHRONIC (07/05/2007), and Vitamin D deficiency.  Clinical Impression  Pt is s/p TKA resulting in the deficits listed below (see PT Problem List). Independent prior to admission; presents with decr functional mobility, decr activity tolerance;  Pt will benefit from skilled PT to increase their independence and safety with mobility to allow discharge to the venue listed below.      Follow Up Recommendations Follow surgeon's recommendation for DC plan and follow-up therapies;Supervision/Assistance - 24 hour    Equipment Recommendations  Rolling walker with 5" wheels;3in1 (PT)    Recommendations for Other Services       Precautions / Restrictions Precautions Precautions: Knee;Fall Precaution Comments: Nausea and vomiting with movement Restrictions Weight Bearing Restrictions: Yes LLE Weight Bearing: Weight bearing as tolerated  Pt educated to not allow any pillow or bolster under knee for healing with optimal range of motion.      Mobility  Bed Mobility Overal bed mobility: Needs Assistance Bed Mobility: Supine to Sit     Supine to sit: Min guard     General bed mobility comments: Cues for tecnqiue and to self monitor for activity tolerance  Transfers Overall transfer level: Needs assistance Equipment used: Rolling walker (2 wheeled) Transfers: Sit to/from Stand Sit to Stand: Min guard         General transfer comment: Cues for hand placement and  safety  Ambulation/Gait Ambulation/Gait assistance: Min guard Gait Distance (Feet): 60 Feet Assistive device: Rolling walker (2 wheeled) Gait Pattern/deviations: Step-through pattern(emerging) Gait velocity: slwoed   General Gait Details: Cues to activate L quad in stance for stability; no buckling noted; cues to self-monitor for activity tolerance as she had vomited earlier in session  Stairs            Wheelchair Mobility    Modified Rankin (Stroke Patients Only)       Balance                                             Pertinent Vitals/Pain Pain Assessment: Faces Faces Pain Scale: Hurts little more Pain Location: L knee with weight bearing Pain Descriptors / Indicators: Grimacing Pain Intervention(s): Monitored during session    Home Living Family/patient expects to be discharged to:: Private residence Living Arrangements: Spouse/significant other Available Help at Discharge: Family Type of Home: House Home Access: Stairs to enter Entrance Stairs-Rails: None Technical brewer of Steps: 1 Home Layout: Two level;Bed/bath upstairs        Prior Function Level of Independence: Independent               Hand Dominance        Extremity/Trunk Assessment   Upper Extremity Assessment Upper Extremity Assessment: Overall WFL for tasks assessed    Lower Extremity Assessment Lower Extremity Assessment: LLE deficits/detail LLE Deficits / Details: Grossly decr AROM and strength L knee postop; range approx 5-75 deg       Communication   Communication: No difficulties  Cognition Arousal/Alertness: Awake/alert Behavior During Therapy: WFL for tasks assessed/performed Overall  Cognitive Status: Within Functional Limits for tasks assessed                                        General Comments General comments (skin integrity, edema, etc.): Vomited once EOB, pt says vomiting is not unusual    Exercises Total  Joint Exercises Quad Sets: AROM;Left;10 reps Straight Leg Raises: AROM;Left;10 reps   Assessment/Plan    PT Assessment Patient needs continued PT services  PT Problem List Decreased strength;Decreased range of motion;Decreased activity tolerance;Decreased balance;Decreased mobility;Decreased knowledge of use of DME;Decreased knowledge of precautions;Pain       PT Treatment Interventions DME instruction;Gait training;Stair training;Functional mobility training;Therapeutic activities;Therapeutic exercise;Balance training;Patient/family education    PT Goals (Current goals can be found in the Care Plan section)  Acute Rehab PT Goals Patient Stated Goal: walk without pain PT Goal Formulation: With patient Time For Goal Achievement: 10/14/18 Potential to Achieve Goals: Good    Frequency 7X/week   Barriers to discharge Inaccessible home environment flight to get ot full bathroom     Co-evaluation               AM-PAC PT "6 Clicks" Daily Activity  Outcome Measure Difficulty turning over in bed (including adjusting bedclothes, sheets and blankets)?: A Little Difficulty moving from lying on back to sitting on the side of the bed? : A Little Difficulty sitting down on and standing up from a chair with arms (e.g., wheelchair, bedside commode, etc,.)?: A Little Help needed moving to and from a bed to chair (including a wheelchair)?: A Little Help needed walking in hospital room?: A Lot Help needed climbing 3-5 steps with a railing? : A Lot 6 Click Score: 16    End of Session Equipment Utilized During Treatment: Gait belt Activity Tolerance: Patient tolerated treatment well Patient left: in chair;with call bell/phone within reach Nurse Communication: Mobility status PT Visit Diagnosis: Unsteadiness on feet (R26.81);Pain Pain - Right/Left: Left Pain - part of body: Knee    Time: 0829-0903 PT Time Calculation (min) (ACUTE ONLY): 34 min   Charges:   PT Evaluation $PT Eval  Moderate Complexity: 1 Mod PT Treatments $Gait Training: 8-22 mins        Latasha Hicks, PT  Acute Rehabilitation Services Pager [phone] Office [phone]   Latasha Hicks 09/30/2018, 10:10 AM

## 2018-09-30 NOTE — Progress Notes (Signed)
Subjective: 1 Day Post-Op Procedure(s) (LRB): LEFT TOTAL KNEE ARTHROPLASTY (Left) Patient reports pain as moderate.  Doing ok this am.  Wanting to go home today if possible  Objective: Vital signs in last 24 hours: Temp:  [97 F (36.1 C)-98.3 F (36.8 C)] 97.4 F (36.3 C) (11/05 0323) Pulse Rate:  [45-74] 46 (11/05 0323) Resp:  [12-25] 16 (11/05 0323) BP: (99-138)/(51-83) 105/55 (11/05 0323) SpO2:  [91 %-100 %] 91 % (11/05 0323)  Intake/Output from previous day: 11/04 0701 - 11/05 0700 In: 1530 [P.O.:680; I.V.:850] Out: 650 [Urine:600; Blood:50] Intake/Output this shift: No intake/output data recorded.  Recent Labs    09/30/18 0311  HGB 14.1   Recent Labs    09/30/18 0311  WBC 10.6*  RBC 4.63  HCT 43.6  PLT 241   Recent Labs    09/30/18 0311  NA 137  K 4.5  CL 109  CO2 22  BUN 13  CREATININE 0.81  GLUCOSE 155*  CALCIUM 8.9   No results for input(s): LABPT, INR in the last 72 hours.  Neurologically intact Neurovascular intact Sensation intact distally Intact pulses distally Dorsiflexion/Plantar flexion intact Incision: dressing C/D/I No cellulitis present Compartment soft  Anticipated LOS equal to or greater than 2 midnights due to - Age 18 and older with one or more of the following:  - Obesity  - Expected need for hospital services (PT, OT, Nursing) required for safe  discharge  - Anticipated need for postoperative skilled nursing care or inpatient rehab  - Active co-morbidities: None OR   - Unanticipated findings during/Post Surgery: Slow post-op progression: GI, pain control, mobility  - Patient is a high risk of re-admission due to: None   Assessment/Plan: 1 Day Post-Op Procedure(s) (LRB): LEFT TOTAL KNEE ARTHROPLASTY (Left) Advance diet Up with therapy D/C IV fluids Discharge home with home health today as long as patient mobilizes well with PT and pain under control WBAT LLE Dressing changed by me today Please apply ted hose to  [place] 09/30/2018, 7:49 AM

## 2018-10-01 NOTE — Progress Notes (Signed)
Subjective: 2 Days Post-Op Procedure(s) (LRB): LEFT TOTAL KNEE ARTHROPLASTY (Left) Patient reports pain as moderate.    Objective: Vital signs in last 24 hours: Temp:  [97.5 F (36.4 C)-97.8 F (36.6 C)] 97.5 F (36.4 C) (11/06 0452) Pulse Rate:  [50-60] 60 (11/06 0452) Resp:  [16-18] 18 (11/06 0452) BP: (114-141)/(71-84) 114/71 (11/06 0452) SpO2:  [93 %-99 %] 99 % (11/06 0452)  Intake/Output from previous day: 11/05 0701 - 11/06 0700 In: 82.8 [I.V.:82.8] Out: -  Intake/Output this shift: No intake/output data recorded.  Recent Labs    09/30/18 0311  HGB 14.1   Recent Labs    09/30/18 0311  WBC 10.6*  RBC 4.63  HCT 43.6  PLT 241   Recent Labs    09/30/18 0311  NA 137  K 4.5  CL 109  CO2 22  BUN 13  CREATININE 0.81  GLUCOSE 155*  CALCIUM 8.9   No results for input(s): LABPT, INR in the last 72 hours.  Neurologically intact Neurovascular intact Sensation intact distally Intact pulses distally Dorsiflexion/Plantar flexion intact Incision: dressing C/D/I No cellulitis present Compartment soft  Anticipated LOS equal to or greater than 2 midnights due to - Age 56 and older with one or more of the following:  - Obesity  - Expected need for hospital services (PT, OT, Nursing) required for safe  discharge  - Anticipated need for postoperative skilled nursing care or inpatient rehab  - Active co-morbidities: None OR   - Unanticipated findings during/Post Surgery: Slow post-op progression: GI, pain control, mobility  - Patient is a high risk of re-admission due to: None   Assessment/Plan: 2 Days Post-Op Procedure(s) (LRB): LEFT TOTAL KNEE ARTHROPLASTY (Left) Advance diet Up with therapy Discharge home with home health after first or second PT session (up to patient) WBAT LLE     Aundra Dubin 10/01/2018, 7:49 AM

## 2018-10-01 NOTE — Progress Notes (Signed)
Physical Therapy Treatment Patient Details Name: Latasha Hicks MRN: [id number] DOB: 01/16/1961 Today's Date: 10/01/2018    History of Present Illness Admitted for LTKA, [name];  has a past medical history of Allergic rhinitis, Complication of anesthesia, Endometriosis, Hemorrhoids, History of kidney stones, Hydronephrosis, Kidney stones, Obstructive sleep apnea, Osteoarthritis, Perianal abscess (05/01/2013), Personal history of colonic polyps, Urinary incontinence, VENOUS INSUFFICIENCY, CHRONIC (07/05/2007), and Vitamin D deficiency.    PT Comments    Continuing work on functional mobility and activity tolerance;  Making great progress and OK for dc home from PT standpoint; Stair training complete, and husband was present as well; Discussed car transfers, too   Follow Up Recommendations  Follow surgeon's recommendation for DC plan and follow-up therapies;Supervision/Assistance - 24 hour     Equipment Recommendations  Rolling walker with 5" wheels;3in1 (PT)    Recommendations for Other Services       Precautions / Restrictions Precautions Precautions: Knee;Fall Precaution Booklet Issued: Yes (comment) Restrictions Weight Bearing Restrictions: Yes LLE Weight Bearing: Weight bearing as tolerated    Mobility  Bed Mobility Overal bed mobility: Modified Independent Bed Mobility: Supine to Sit     Supine to sit: Modified independent (Device/Increase time)     General bed mobility comments: slightly incr time  Transfers Overall transfer level: Needs assistance Equipment used: Rolling walker (2 wheeled) Transfers: Sit to/from Stand Sit to Stand: Supervision         General transfer comment: Cues for hand placement and safety  Ambulation/Gait   Gait Distance (Feet): 200 Feet(100+100) Assistive device: Rolling walker (2 wheeled) Gait Pattern/deviations: Step-through pattern     General Gait Details: Cues to activate L quad in stance for stability; no buckling noted;  cues to self-monitor for activity tolerance   Stairs Stairs: Yes Stairs assistance: Min assist Stair Management: One rail Right;Step to pattern;Forwards Number of Stairs: 20(12+8) General stair comments: Demonstrated backward technqiue with pt's husband providing assist; then ascended flight of steps with R rail and LUE support provided by husband; Cues for sequencing; then performed more stairs with cane and rail   Wheelchair Mobility    Modified Rankin (Stroke Patients Only)       Balance                                            Cognition Arousal/Alertness: Awake/alert Behavior During Therapy: WFL for tasks assessed/performed Overall Cognitive Status: Within Functional Limits for tasks assessed                                        Exercises Total Joint Exercises Ankle Circles/Pumps: AROM;Both;20 reps Quad Sets: AROM;Left;10 reps Short Arc Quad: AROM;Left;10 reps Heel Slides: AROM;AAROM;Left;10 reps Straight Leg Raises: AROM;Left;10 reps Goniometric ROM: approx 2-88 deg    General Comments        Pertinent Vitals/Pain Pain Assessment: 0-10 Pain Score: 9  Pain Location: L knee with weight bearing and stair training; subsided somewhat quickly Pain Descriptors / Indicators: Grimacing Pain Intervention(s): Monitored during session;Ice applied    Home Living                      Prior Function            PT Goals (current goals can now be found in  the care plan section) Acute Rehab PT Goals Patient Stated Goal: walk without pain PT Goal Formulation: With patient Time For Goal Achievement: 10/14/18 Potential to Achieve Goals: Good Progress towards PT goals: Progressing toward goals    Frequency    7X/week      PT Plan Current plan remains appropriate    Co-evaluation              AM-PAC PT "6 Clicks" Daily Activity  Outcome Measure  Difficulty turning over in bed (including adjusting  bedclothes, sheets and blankets)?: A Little Difficulty moving from lying on back to sitting on the side of the bed? : None Difficulty sitting down on and standing up from a chair with arms (e.g., wheelchair, bedside commode, etc,.)?: A Little Help needed moving to and from a bed to chair (including a wheelchair)?: None Help needed walking in hospital room?: None Help needed climbing 3-5 steps with a railing? : A Little 6 Click Score: 21    End of Session Equipment Utilized During Treatment: Gait belt Activity Tolerance: Patient tolerated treatment well Patient left: in chair;with call bell/phone within reach;with family/visitor present Nurse Communication: Mobility status PT Visit Diagnosis: Unsteadiness on feet (R26.81);Pain Pain - Right/Left: Left Pain - part of body: Knee     Time: 1610-9604 PT Time Calculation (min) (ACUTE ONLY): 58 min  Charges:  $Gait Training: 23-37 mins $Therapeutic Exercise: 8-22 mins $Therapeutic Activity: 8-22 mins                     Roney Marion, PT  Acute Rehabilitation Services Pager [phone] Office [place] 10/01/2018, 12:30 PM

## 2018-10-01 NOTE — Plan of Care (Signed)
  Problem: Activity: Goal: Ability to avoid complications of mobility impairment will improve Outcome: Progressing   Problem: Clinical Measurements: Goal: Postoperative complications will be avoided or minimized Outcome: Progressing   Problem: Pain Management: Goal: Pain level will decrease with appropriate interventions Outcome: Progressing   Problem: Skin Integrity: Goal: Will show signs of wound healing Outcome: Progressing   

## 2018-10-01 NOTE — Progress Notes (Signed)
Provided discharge education/instructions, all questions and concerns addressed, Pt not in distress, discharged home with belongings accompanied by husband. 

## 2018-10-02 ENCOUNTER — Encounter: Payer: Self-pay | Admitting: Internal Medicine

## 2018-10-03 ENCOUNTER — Telehealth (INDEPENDENT_AMBULATORY_CARE_PROVIDER_SITE_OTHER): Payer: Self-pay | Admitting: Orthopaedic Surgery

## 2018-10-03 NOTE — Telephone Encounter (Signed)
xu pt-call

## 2018-10-03 NOTE — Telephone Encounter (Signed)
Patient called stating that she had surgery on Monday with Dr. Erlinda Hong and had some questions about the medication.  CB#[phone].  Thank you.

## 2018-10-03 NOTE — Telephone Encounter (Signed)
IC s/w patient she was asking about taking an anti-inflammatory. She said nothing was prescribed for her. I advised that typically we do not prescribe anti-inflam after surgery since on pain medication and muscle relaxer. I did tell her if she felt she needed something in between doses of pain medication ok to take advil/anti-inflam as long as it did not interfere with any of her other medications. She was concerned b/c she was put on aspirin 2/day after surgery which is something that she usually does not take. Please advise. Thanks.

## 2018-10-03 NOTE — Telephone Encounter (Signed)
Will you let her know that aspirin bid is for dvt ppx and to not take any other nsaids while on this

## 2018-10-03 NOTE — Telephone Encounter (Signed)
IC advised.  

## 2018-10-06 ENCOUNTER — Telehealth (INDEPENDENT_AMBULATORY_CARE_PROVIDER_SITE_OTHER): Payer: Self-pay | Admitting: Orthopaedic Surgery

## 2018-10-06 NOTE — Telephone Encounter (Signed)
[facility]  [phone]     PT Verbal orders  3 times this week  2 times next week

## 2018-10-06 NOTE — Telephone Encounter (Signed)
Okay on orders. 

## 2018-10-06 NOTE — Telephone Encounter (Signed)
yes

## 2018-10-06 NOTE — Telephone Encounter (Signed)
Called [facility] no answer.LMOM to return call.

## 2018-10-07 ENCOUNTER — Telehealth (INDEPENDENT_AMBULATORY_CARE_PROVIDER_SITE_OTHER): Payer: Self-pay | Admitting: Orthopaedic Surgery

## 2018-10-07 ENCOUNTER — Other Ambulatory Visit: Payer: Self-pay | Admitting: Internal Medicine

## 2018-10-07 NOTE — Telephone Encounter (Signed)
Can't refill oxycontin.  I can refill oxycodone if she needs it

## 2018-10-07 NOTE — Telephone Encounter (Signed)
See message.

## 2018-10-07 NOTE — Telephone Encounter (Signed)
Patient called needing Rx refilled (Oxycontin) The number to contact patient is [phone]

## 2018-10-08 ENCOUNTER — Other Ambulatory Visit (INDEPENDENT_AMBULATORY_CARE_PROVIDER_SITE_OTHER): Payer: Self-pay

## 2018-10-08 MED ORDER — OXYCODONE HCL 5 MG PO TABS: 5.0000 mg | ORAL_TABLET | ORAL | 0 refills | Status: DC | PRN

## 2018-10-08 NOTE — Telephone Encounter (Signed)
Ready for pick up

## 2018-10-08 NOTE — Telephone Encounter (Signed)
Patient left a message in regards to a refill on her pain medication.  I advised her of the message from Dr. Erlinda Hong and she is fine with a refill on the Oxycodone.  I also advised her that Dr. Erlinda Hong will be here this afternoon and that you would call her when the RX is ready for pu.  CB#[phone].  Thank you

## 2018-10-08 NOTE — Telephone Encounter (Signed)
Called Kristen back to advise ok on orders. Marland Kitchen

## 2018-10-14 ENCOUNTER — Ambulatory Visit
Admission: RE | Admit: 2018-10-14 | Discharge: 2018-10-14 | Disposition: A | Discharge status: Home / Self Care | Payer: 59 | Source: Ambulatory Visit | Attending: Internal Medicine | Admitting: Internal Medicine

## 2018-10-14 ENCOUNTER — Ambulatory Visit (INDEPENDENT_AMBULATORY_CARE_PROVIDER_SITE_OTHER): Payer: 59 | Admitting: Orthopaedic Surgery

## 2018-10-14 ENCOUNTER — Encounter (INDEPENDENT_AMBULATORY_CARE_PROVIDER_SITE_OTHER): Payer: Self-pay | Admitting: Orthopaedic Surgery

## 2018-10-14 DIAGNOSIS — Z96652 Presence of left artificial knee joint: Secondary | ICD-10-CM

## 2018-10-14 MED ORDER — OXYCODONE HCL 5 MG PO TABS: 5.0000 mg | ORAL_TABLET | Freq: Three times a day (TID) | ORAL | 0 refills | Status: DC | PRN

## 2018-10-14 NOTE — Progress Notes (Signed)
Post-Op Visit Note   Patient: Latasha Hicks           Date of Birth: Feb 23, 1961           MRN: [id number] Visit Date: 10/14/2018 PCP: Cassandria Anger, MD   Assessment & Plan:  Chief Complaint:  Chief Complaint  Patient presents with  . Left Knee - Pain   Visit Diagnoses:  1. Status post left knee replacement     Plan: Latasha Hicks is 2-week status post left total knee replacement.  She is progressing very well with home health physical therapy.  Her range of motion is 93 degrees of flexion.  Her surgical incision is completely healed without any signs of infection or drainage.  She has mild swelling.  She has no calf tenderness.  She did state that she took herself off of the aspirin about 4 days ago because she thought it made her shiver but I told her that this is unlikely a side effect and that she needs to get back on the aspirin for DVT prophylaxis.  I refilled her oxycodone today.  We provided her with a handicap pass.  Referral to outpatient physical therapy at [facility].  Recheck in 4 weeks with three-view x-rays of the left knee.  Follow-Up Instructions: Return in about 4 weeks (around 11/11/2018).   Orders:  No orders of the defined types were placed in this encounter.  Meds ordered this encounter  Medications  . oxyCODONE (OXY IR/ROXICODONE) 5 MG immediate release tablet    Sig: Take 1-2 tablets (5-10 mg total) by mouth 3 (three) times daily as needed.    Dispense:  30 tablet    Refill:  0    Imaging: No results found.  PMFS History: Patient Active Problem List   Diagnosis Date Noted  . Primary osteoarthritis of left knee 09/29/2018  . Status post left knee replacement 09/29/2018  . Colon polyp 08/28/2018  . Diarrhea 07/10/2018  . Left knee pain 05/01/2018  . Hematuria 12/24/2017  . Left flank pain 12/24/2017  . Skin lesion 04/24/2017  . Symptomatic abdominal apron 04/15/2017  . H/O laparoscopic adjustable gastric banding 04/15/2017  .  Well adult exam 10/02/2016  . Salivary gland infection 06/01/2016  . Paresthesia 06/10/2015  . Left leg swelling 07/26/2014  . Benign neoplasm of colon 09/04/2013  . Colonic ulcer 09/04/2013  . GI bleed 09/03/2013  . Urinary incontinence 08/21/2013  . Internal hemorrhoids 08/21/2013  . Unspecified constipation 06/12/2013  . LBP (low back pain) 06/12/2013  . History of colonic polyps 06/12/2013  . Right LBP 05/06/2013  . Perianal abscess 05/01/2013  . GRIEF REACTION 03/26/2008  . ALLERGIC RHINITIS 03/26/2008  . OSTEOARTHRITIS 03/26/2008  . Bariatric surgery status 03/26/2008  . Vitamin D deficiency 07/05/2007  . OBESITY, MORBID 07/05/2007  . Obstructive sleep apnea 07/05/2007  . VENOUS INSUFFICIENCY, CHRONIC 07/05/2007  . CHOLELITHIASIS 07/05/2007  . HYDRONEPHROSIS 07/05/2007   Past Medical History:  Diagnosis Date  . Allergic rhinitis   . Complication of anesthesia    "hard to wake up in past"  . Endometriosis   . Hemorrhoids   . History of kidney stones   . Hydronephrosis   . Kidney stones   . Obstructive sleep apnea    used CPAP in past, no longer needed after bariatric surgery  . Osteoarthritis   . Perianal abscess 05/01/2013   6/14 7 o'clock   . Personal history of colonic polyps    Question accuracy, patient report only.  Marland Kitchen  Urinary incontinence   . VENOUS INSUFFICIENCY, CHRONIC 07/05/2007   Annotation: LE Qualifier: Diagnosis of  By: Dance CMA ([place]), Kim    . Vitamin D deficiency     Family History  Problem Relation Age of Onset  . Diabetes Mother   . Cervical cancer Mother   . Hypertension Mother     Past Surgical History:  Procedure Laterality Date  . ABDOMINAL SURGERY    . ACHILLES TENDON SURGERY Left 12/2017  . COLONOSCOPY N/A 09/04/2013   Procedure: COLONOSCOPY;  Surgeon: Inda Castle, MD;  Location: [place];  Service: Endoscopy;  Laterality: N/A;  . COLONOSCOPY W/ POLYPECTOMY    . DILATION AND CURETTAGE OF UTERUS    . HERNIA REPAIR    .  HIATAL HERNIA REPAIR    . LAPAROSCOPIC CHOLECYSTECTOMY    . LAPAROSCOPIC GASTRIC BANDING    . TOTAL KNEE ARTHROPLASTY Left 09/29/2018   Procedure: LEFT TOTAL KNEE ARTHROPLASTY;  Surgeon: Leandrew Koyanagi, MD;  Location: [place];  Service: Orthopedics;  Laterality: Left;  Marland Kitchen VAGINAL HYSTERECTOMY     Social History   Occupational History  . Not on file  Tobacco Use  . Smoking status: Never Smoker  . Smokeless tobacco: Never Used  Substance and Sexual Activity  . Alcohol use: Yes    Comment: rarely  . Drug use: No  . Sexual activity: Yes    Birth control/protection: Post-menopausal

## 2018-10-28 ENCOUNTER — Telehealth: Payer: Self-pay

## 2018-10-28 DIAGNOSIS — R3129 Other microscopic hematuria: Secondary | ICD-10-CM

## 2018-10-28 NOTE — Telephone Encounter (Signed)
Pt was seen in February for Kidney stones.  Please advise about referral.  Copied from [place] [phone]. Topic: Referral - Request for Referral >> Oct 27, 2018  3:36 PM Ivar Drape wrote: Has patient seen PCP for this complaint?  YES *If NO, is insurance requiring patient see PCP for this issue before PCP can refer them? Referral for which specialty:   Urology Preferred provider/office: No one specific, just as long as they are covered under insurance Reason for referral:  Blood in urine >> Oct 27, 2018  3:42 PM Lonia Skinner [place] A wrote: I do not anything this year for patient being seen for this. Unless I am missing it?   Please advise.

## 2018-10-28 NOTE — Telephone Encounter (Signed)
Ok Thx 

## 2018-11-11 ENCOUNTER — Ambulatory Visit (INDEPENDENT_AMBULATORY_CARE_PROVIDER_SITE_OTHER): Payer: 59

## 2018-11-11 ENCOUNTER — Encounter (INDEPENDENT_AMBULATORY_CARE_PROVIDER_SITE_OTHER): Payer: Self-pay | Admitting: Orthopaedic Surgery

## 2018-11-11 ENCOUNTER — Ambulatory Visit (INDEPENDENT_AMBULATORY_CARE_PROVIDER_SITE_OTHER): Payer: 59 | Admitting: Orthopaedic Surgery

## 2018-11-11 DIAGNOSIS — Z96652 Presence of left artificial knee joint: Secondary | ICD-10-CM

## 2018-11-11 MED ORDER — MUPIROCIN 2 % EX OINT: TOPICAL_OINTMENT | CUTANEOUS | 0 refills | Status: DC

## 2018-11-11 NOTE — Progress Notes (Signed)
Post-Op Visit Note   Patient: Latasha Hicks           Date of Birth: 12/12/1960           MRN: [id number] Visit Date: 11/11/2018 PCP: Cassandria Anger, MD   Assessment & Plan:  Chief Complaint:  Chief Complaint  Patient presents with  . Left Knee - Routine Post Op   Visit Diagnoses:  1. S/P TKR (total knee replacement), left     Plan: Patient is a pleasant 57 year old female presents to our clinic today 43 days status post left total knee replacement, date of surgery 09/29/2018.  She has been doing very well.  She admits to increased pain and tightness today, but does note a lot of sitting and driving yesterday.  She has been in physical therapy where she is making great progress.  No fevers or chills.  Examination of the left knee shows a small effusion.  She does have some swelling distally as well.  Calf is soft and nontender.  She has a well-healing surgical incision without evidence of infection.  She has a small suture abscess at the most proximal aspect.  Range of motion 0 to 115 degrees.  She is neurovascular intact distally in stable valgus varus stress.  At this point, she will continue with formal physical therapy.  We will try and pull the stitch today and apply mupirocin.  I have also called in mupirocin to use twice daily.  Follow-up with [place] in 6 weeks time for recheck.  Follow-Up Instructions: Return in about 6 weeks (around 12/23/2018).   Orders:  Orders Placed This Encounter  Procedures  . XR KNEE 3 VIEW LEFT   Meds ordered this encounter  Medications  . mupirocin ointment (BACTROBAN) 2 %    Sig: Apply to affected area 2 times daily    Dispense:  22 g    Refill:  0    Imaging: Xr Knee 3 View Left  Result Date: 11/11/2018 X-rays demonstrate stable alignment of the prosthesis without evidence of subsidence or osteolysis   PMFS History: Patient Active Problem List   Diagnosis Date Noted  . Primary osteoarthritis of left knee 09/29/2018  . Status  post left knee replacement 09/29/2018  . Colon polyp 08/28/2018  . Diarrhea 07/10/2018  . Left knee pain 05/01/2018  . Hematuria 12/24/2017  . Left flank pain 12/24/2017  . Skin lesion 04/24/2017  . Symptomatic abdominal apron 04/15/2017  . H/O laparoscopic adjustable gastric banding 04/15/2017  . Well adult exam 10/02/2016  . Salivary gland infection 06/01/2016  . Paresthesia 06/10/2015  . Left leg swelling 07/26/2014  . Benign neoplasm of colon 09/04/2013  . Colonic ulcer 09/04/2013  . GI bleed 09/03/2013  . Urinary incontinence 08/21/2013  . Internal hemorrhoids 08/21/2013  . Unspecified constipation 06/12/2013  . LBP (low back pain) 06/12/2013  . History of colonic polyps 06/12/2013  . Right LBP 05/06/2013  . Perianal abscess 05/01/2013  . GRIEF REACTION 03/26/2008  . ALLERGIC RHINITIS 03/26/2008  . OSTEOARTHRITIS 03/26/2008  . Bariatric surgery status 03/26/2008  . Vitamin D deficiency 07/05/2007  . OBESITY, MORBID 07/05/2007  . Obstructive sleep apnea 07/05/2007  . VENOUS INSUFFICIENCY, CHRONIC 07/05/2007  . CHOLELITHIASIS 07/05/2007  . HYDRONEPHROSIS 07/05/2007   Past Medical History:  Diagnosis Date  . Allergic rhinitis   . Complication of anesthesia    "hard to wake up in past"  . Endometriosis   . Hemorrhoids   . History of kidney stones   .  Hydronephrosis   . Kidney stones   . Obstructive sleep apnea    used CPAP in past, no longer needed after bariatric surgery  . Osteoarthritis   . Perianal abscess 05/01/2013   6/14 7 o'clock   . Personal history of colonic polyps    Question accuracy, patient report only.  . Urinary incontinence   . VENOUS INSUFFICIENCY, CHRONIC 07/05/2007   Annotation: LE Qualifier: Diagnosis of  By: Dance CMA ([place]), Kim    . Vitamin D deficiency     Family History  Problem Relation Age of Onset  . Diabetes Mother   . Cervical cancer Mother   . Hypertension Mother     Past Surgical History:  Procedure Laterality Date  .  ABDOMINAL SURGERY    . ACHILLES TENDON SURGERY Left 12/2017  . BREAST CYST EXCISION Left   . COLONOSCOPY N/A 09/04/2013   Procedure: COLONOSCOPY;  Surgeon: Inda Castle, MD;  Location: [facility];  Service: Endoscopy;  Laterality: N/A;  . COLONOSCOPY W/ POLYPECTOMY    . DILATION AND CURETTAGE OF UTERUS    . HERNIA REPAIR    . HIATAL HERNIA REPAIR    . LAPAROSCOPIC CHOLECYSTECTOMY    . LAPAROSCOPIC GASTRIC BANDING    . TOTAL KNEE ARTHROPLASTY Left 09/29/2018   Procedure: LEFT TOTAL KNEE ARTHROPLASTY;  Surgeon: Leandrew Koyanagi, MD;  Location: [facility];  Service: Orthopedics;  Laterality: Left;  Marland Kitchen VAGINAL HYSTERECTOMY     Social History   Occupational History  . Not on file  Tobacco Use  . Smoking status: Never Smoker  . Smokeless tobacco: Never Used  Substance and Sexual Activity  . Alcohol use: Yes    Comment: rarely  . Drug use: No  . Sexual activity: Yes    Birth control/protection: Post-menopausal

## 2018-11-17 ENCOUNTER — Telehealth (INDEPENDENT_AMBULATORY_CARE_PROVIDER_SITE_OTHER): Payer: Self-pay | Admitting: Orthopaedic Surgery

## 2018-11-17 NOTE — Telephone Encounter (Signed)
[place] for note, seen in clinic 11/11/18 and return to work on 12/01/18?

## 2018-11-17 NOTE — Telephone Encounter (Signed)
Patient came into the clinic today to get another copy of the letter Dr. Erlinda Hong wrote her back on December 17th.  She also requested that the letter state that she will be returning to work December 01, 2018.  Patient requested that you mail the letter to her.  CB#[phone]  Thank you.

## 2018-11-17 NOTE — Telephone Encounter (Signed)
That's fine

## 2018-11-20 ENCOUNTER — Encounter (INDEPENDENT_AMBULATORY_CARE_PROVIDER_SITE_OTHER): Payer: Self-pay

## 2018-11-20 NOTE — Telephone Encounter (Signed)
Please see below. I'm in [place] office today and do not have access to his stamp/signature to mail letter. Thanks.

## 2018-11-20 NOTE — Telephone Encounter (Signed)
Note made and will mail out today.

## 2018-12-23 ENCOUNTER — Ambulatory Visit (INDEPENDENT_AMBULATORY_CARE_PROVIDER_SITE_OTHER): Payer: 59 | Admitting: Orthopaedic Surgery

## 2018-12-30 ENCOUNTER — Ambulatory Visit (INDEPENDENT_AMBULATORY_CARE_PROVIDER_SITE_OTHER): Payer: 59 | Admitting: Orthopaedic Surgery

## 2018-12-30 ENCOUNTER — Encounter (INDEPENDENT_AMBULATORY_CARE_PROVIDER_SITE_OTHER): Payer: Self-pay | Admitting: Orthopaedic Surgery

## 2018-12-30 ENCOUNTER — Ambulatory Visit (INDEPENDENT_AMBULATORY_CARE_PROVIDER_SITE_OTHER): Payer: 59

## 2018-12-30 DIAGNOSIS — M25561 Pain in right knee: Secondary | ICD-10-CM

## 2018-12-30 DIAGNOSIS — Z96652 Presence of left artificial knee joint: Secondary | ICD-10-CM

## 2018-12-30 DIAGNOSIS — G8929 Other chronic pain: Secondary | ICD-10-CM

## 2018-12-30 MED ORDER — METHYLPREDNISOLONE ACETATE 40 MG/ML IJ SUSP
40.0000 mg | INTRAMUSCULAR | Status: AC | PRN
  Administered 2018-12-30: 40 mg via INTRA_ARTICULAR

## 2018-12-30 MED ORDER — BUPIVACAINE HCL 0.25 % IJ SOLN
2.0000 mL | INTRAMUSCULAR | Status: AC | PRN
  Administered 2018-12-30: 2 mL via INTRA_ARTICULAR

## 2018-12-30 MED ORDER — LIDOCAINE HCL 1 % IJ SOLN
2.0000 mL | INTRAMUSCULAR | Status: AC | PRN
  Administered 2018-12-30: 2 mL

## 2018-12-30 NOTE — Progress Notes (Signed)
Office Visit Note   Patient: Latasha Hicks           Date of Birth: [birth year]           MRN: [id number] Visit Date: 12/30/2018              Requested by: Cassandria Anger, MD [place], [place] [address] PCP: Cassandria Anger, MD   Assessment & Plan: Visit Diagnoses:  1. Chronic pain of right knee   2. Hx of total knee replacement, left     Plan: Impression is status post left total knee replacement doing well.  She will follow-up with [place] in 6 months time for recheck.  In regards to her right knee, I think she is having a flareup of her arthritis versus less likely lumbar radiculopathy.  We will proceed with a diagnostic note with therapeutic cortisone injection to the right knee.  Follow-Up Instructions: Return in about 6 months (around 06/30/2019).   Orders:  Orders Placed This Encounter  Procedures  . Large Joint Inj: R knee  . XR KNEE 3 VIEW RIGHT   No orders of the defined types were placed in this encounter.     Procedures: Large Joint Inj: R knee on 12/30/2018 10:00 AM Indications: pain Details: 22 G needle, anterolateral approach Medications: 2 mL lidocaine 1 %; 2 mL bupivacaine 0.25 %; 40 mg methylPREDNISolone acetate 40 MG/ML      Clinical Data: No additional findings.   Subjective: Chief Complaint  Patient presents with  . Left Knee - Pain, Routine Post Op, Follow-up  . Right Knee - Pain    HPI patient is a pleasant 58 year old female presents to clinic today 3 months status post left total knee replacement.  Doing very well with occasional tightness.  She has finished formal physical therapy and has made great progress with range of motion and strengthening.  The issue today is primarily her right knee.  History of right knee pain over the past week.  No specific injury or change in activity.  She notes the inability to lift her leg into her car recently secondary to the pain.  She is having stiffness and popping as well.  No  radicular symptoms.  No previous history of lumbar pathology.  Review of Systems as detailed in HPI.  All others reviewed and are negative.   Objective: Vital Signs: There were no vitals taken for this visit.  Physical Exam well-developed and well-nourished female in no acute distress.  Alert and oriented x3.  Ortho Exam examination of the left knee shows a well-healed surgical incision without evidence of infection.  Mild swelling.  Calf is soft nontender.  Range of motion 0 to 125 degrees.  Stable valgus varus stress.  Right knee exam shows no effusion.  Range of motion 0 to 120 degrees.  No joint line tenderness.  Calf is soft and nontender.  She is stable valgus varus stress.  Negative straight leg raise.  No joint line tenderness.  Mild patellofemoral crepitus.  She is neurovascularly intact distally.  Specialty Comments:  No specialty comments available.  Imaging: Xr Knee 3 View Right  Result Date: 12/30/2018 X-rays demonstrate a stable left knee prosthesis.  Right knee demonstrates moderate osteoarthritis with particular spurring.    PMFS History: Patient Active Problem List   Diagnosis Date Noted  . Primary osteoarthritis of left knee 09/29/2018  . Hx of total knee replacement, left 09/29/2018  . Colon polyp 08/28/2018  . Diarrhea  07/10/2018  . Chronic pain of right knee 05/01/2018  . Hematuria 12/24/2017  . Left flank pain 12/24/2017  . Skin lesion 04/24/2017  . Symptomatic abdominal apron 04/15/2017  . H/O laparoscopic adjustable gastric banding 04/15/2017  . Well adult exam 10/02/2016  . Salivary gland infection 06/01/2016  . Paresthesia 06/10/2015  . Left leg swelling 07/26/2014  . Benign neoplasm of colon 09/04/2013  . Colonic ulcer 09/04/2013  . GI bleed 09/03/2013  . Urinary incontinence 08/21/2013  . Internal hemorrhoids 08/21/2013  . Unspecified constipation 06/12/2013  . LBP (low back pain) 06/12/2013  . History of colonic polyps 06/12/2013  . Right LBP  05/06/2013  . Perianal abscess 05/01/2013  . GRIEF REACTION 03/26/2008  . ALLERGIC RHINITIS 03/26/2008  . OSTEOARTHRITIS 03/26/2008  . Bariatric surgery status 03/26/2008  . Vitamin D deficiency 07/05/2007  . OBESITY, MORBID 07/05/2007  . Obstructive sleep apnea 07/05/2007  . VENOUS INSUFFICIENCY, CHRONIC 07/05/2007  . CHOLELITHIASIS 07/05/2007  . HYDRONEPHROSIS 07/05/2007   Past Medical History:  Diagnosis Date  . Allergic rhinitis   . Complication of anesthesia    "hard to wake up in past"  . Endometriosis   . Hemorrhoids   . History of kidney stones   . Hydronephrosis   . Kidney stones   . Obstructive sleep apnea    used CPAP in past, no longer needed after bariatric surgery  . Osteoarthritis   . Perianal abscess 05/01/2013   6/14 7 o'clock   . Personal history of colonic polyps    Question accuracy, patient report only.  . Urinary incontinence   . VENOUS INSUFFICIENCY, CHRONIC 07/05/2007   Annotation: LE Qualifier: Diagnosis of  By: Dance CMA ([place]), Kim    . Vitamin D deficiency     Family History  Problem Relation Age of Onset  . Diabetes Mother   . Cervical cancer Mother   . Hypertension Mother     Past Surgical History:  Procedure Laterality Date  . ABDOMINAL SURGERY    . ACHILLES TENDON SURGERY Left 12/2017  . BREAST CYST EXCISION Left   . COLONOSCOPY N/A 09/04/2013   Procedure: COLONOSCOPY;  Surgeon: Inda Castle, MD;  Location: [facility];  Service: Endoscopy;  Laterality: N/A;  . COLONOSCOPY W/ POLYPECTOMY    . DILATION AND CURETTAGE OF UTERUS    . HERNIA REPAIR    . HIATAL HERNIA REPAIR    . LAPAROSCOPIC CHOLECYSTECTOMY    . LAPAROSCOPIC GASTRIC BANDING    . TOTAL KNEE ARTHROPLASTY Left 09/29/2018   Procedure: LEFT TOTAL KNEE ARTHROPLASTY;  Surgeon: Leandrew Koyanagi, MD;  Location: [facility];  Service: Orthopedics;  Laterality: Left;  Marland Kitchen VAGINAL HYSTERECTOMY     Social History   Occupational History  . Not on file  Tobacco Use  . Smoking status:  Never Smoker  . Smokeless tobacco: Never Used  Substance and Sexual Activity  . Alcohol use: Yes    Comment: rarely  . Drug use: No  . Sexual activity: Yes    Birth control/protection: Post-menopausal

## 2019-01-19 ENCOUNTER — Other Ambulatory Visit: Payer: Self-pay | Admitting: Urology

## 2019-01-26 NOTE — Progress Notes (Signed)
ekg 09-19-18 in epic

## 2019-01-26 NOTE — Patient Instructions (Addendum)
Latasha Hicks  01/26/2019   Your procedure is scheduled on: 01-30-19   Report to [facility] Main  Entrance              Report to admitting at   10:15  AM    Call this number if you have problems the morning of surgery [phone]    Remember: Do not eat food or drink liquids :After Midnight.  BRUSH YOUR TEETH MORNING OF SURGERY AND RINSE YOUR MOUTH OUT, NO CHEWING GUM CANDY OR MINTS.     Take these medicines the morning of surgery with A SIP OF WATER: none                                You may not have any metal on your body including hair pins and              piercings  Do not wear jewelry, make-up, lotions, powders or perfumes, deodorant             Do not wear nail polish.  Do not shave  48 hours prior to surgery.     Do not bring valuables to the hospital. [facility].  Contacts, dentures or bridgework may not be worn into surgery.      Patients discharged the day of surgery will not be allowed to drive home. IF YOU ARE HAVING SURGERY AND GOING HOME THE SAME DAY, YOU MUST HAVE AN ADULT TO DRIVE YOU HOME AND BE WITH YOU FOR 24 HOURS. YOU MAY GO HOME BY TAXI OR UBER OR ORTHERWISE, BUT AN ADULT MUST ACCOMPANY YOU HOME AND STAY WITH YOU FOR 24 HOURS.  Name and phone number of your driver:  Special Instructions: N/A              Please read over the following fact sheets you were given: _____________________________________________________________________           [facility] - Preparing for Surgery Before surgery, you can play an important role.  Because skin is not sterile, your skin needs to be as free of germs as possible.  You can reduce the number of germs on your skin by washing with CHG (chlorahexidine gluconate) soap before surgery.  CHG is an antiseptic cleaner which kills germs and bonds with the skin to continue killing germs even after washing. Please DO NOT use if you have an allergy to  CHG or antibacterial soaps.  If your skin becomes reddened/irritated stop using the CHG and inform your nurse when you arrive at Short Stay. Do not shave (including legs and underarms) for at least 48 hours prior to the first CHG shower.  You may shave your face/neck. Please follow these instructions carefully:  1.  Shower with CHG Soap the night before surgery and the  morning of Surgery.  2.  If you choose to wash your hair, wash your hair first as usual with your  normal  shampoo.  3.  After you shampoo, rinse your hair and body thoroughly to remove the  shampoo.                           4.  Use CHG as you  would any other liquid soap.  You can apply chg directly  to the skin and wash                       Gently with a scrungie or clean washcloth.  5.  Apply the CHG Soap to your body ONLY FROM THE NECK DOWN.   Do not use on face/ open                           Wound or open sores. Avoid contact with eyes, ears mouth and genitals (private parts).                       Wash face,  Genitals (private parts) with your normal soap.             6.  Wash thoroughly, paying special attention to the area where your surgery  will be performed.  7.  Thoroughly rinse your body with warm water from the neck down.  8.  DO NOT shower/wash with your normal soap after using and rinsing off  the CHG Soap.                9.  Pat yourself dry with a clean towel.            10.  Wear clean pajamas.            11.  Place clean sheets on your bed the night of your first shower and do not  sleep with pets. Day of Surgery : Do not apply any lotions/deodorants the morning of surgery.  Please wear clean clothes to the hospital/surgery center.  FAILURE TO FOLLOW THESE INSTRUCTIONS MAY RESULT IN THE CANCELLATION OF YOUR SURGERY PATIENT SIGNATURE_________________________________  NURSE SIGNATURE__________________________________  ________________________________________________________________________

## 2019-01-27 ENCOUNTER — Encounter (HOSPITAL_COMMUNITY)
Admission: RE | Admit: 2019-01-27 | Discharge: 2019-01-27 | Disposition: A | Discharge status: Home / Self Care | Payer: 59 | Source: Ambulatory Visit | Attending: Urology | Admitting: Urology

## 2019-01-27 ENCOUNTER — Other Ambulatory Visit: Payer: Self-pay

## 2019-01-27 ENCOUNTER — Encounter (HOSPITAL_COMMUNITY): Payer: Self-pay

## 2019-01-27 DIAGNOSIS — N200 Calculus of kidney: Secondary | ICD-10-CM | POA: Diagnosis present

## 2019-01-27 DIAGNOSIS — Z01812 Encounter for preprocedural laboratory examination: Secondary | ICD-10-CM | POA: Insufficient documentation

## 2019-01-27 DIAGNOSIS — Z79899 Other long term (current) drug therapy: Secondary | ICD-10-CM | POA: Diagnosis not present

## 2019-01-27 DIAGNOSIS — Z96652 Presence of left artificial knee joint: Secondary | ICD-10-CM | POA: Diagnosis not present

## 2019-01-27 DIAGNOSIS — E559 Vitamin D deficiency, unspecified: Secondary | ICD-10-CM | POA: Diagnosis not present

## 2019-01-27 DIAGNOSIS — G4733 Obstructive sleep apnea (adult) (pediatric): Secondary | ICD-10-CM | POA: Diagnosis not present

## 2019-01-27 DIAGNOSIS — Z87442 Personal history of urinary calculi: Secondary | ICD-10-CM | POA: Diagnosis not present

## 2019-01-27 HISTORY — DX: Hematuria, unspecified: R31.9

## 2019-01-27 LAB — CBC
HCT: 45.6 % (ref 36.0–46.0)
HEMOGLOBIN: 14 g/dL (ref 12.0–15.0)
MCH: 30.1 pg (ref 26.0–34.0)
MCHC: 30.7 g/dL (ref 30.0–36.0)
MCV: 98.1 fL (ref 80.0–100.0)
Platelets: 238 10*3/uL (ref 150–400)
RBC: 4.65 MIL/uL (ref 3.87–5.11)
RDW: 14.1 % (ref 11.5–15.5)
WBC: 5.5 10*3/uL (ref 4.0–10.5)
nRBC: 0 % (ref 0.0–0.2)

## 2019-01-27 LAB — BASIC METABOLIC PANEL
Anion gap: 6 (ref 5–15)
BUN: 14 mg/dL (ref 6–20)
CO2: 21 mmol/L — AB (ref 22–32)
Calcium: 8.5 mg/dL — ABNORMAL LOW (ref 8.9–10.3)
Chloride: 109 mmol/L (ref 98–111)
Creatinine, Ser: 0.67 mg/dL (ref 0.44–1.00)
GFR calc Af Amer: >60 mL/min (ref >60)
GFR calc non Af Amer: >60 mL/min (ref >60)
GLUCOSE: 101 mg/dL — AB (ref 70–99)
POTASSIUM: 4.1 mmol/L (ref 3.5–5.1)
Sodium: 136 mmol/L (ref 135–145)

## 2019-01-30 ENCOUNTER — Ambulatory Visit (HOSPITAL_COMMUNITY): Payer: 59 | Admitting: Physician Assistant

## 2019-01-30 ENCOUNTER — Ambulatory Visit (HOSPITAL_COMMUNITY): Payer: 59

## 2019-01-30 ENCOUNTER — Ambulatory Visit (HOSPITAL_COMMUNITY): Payer: 59 | Admitting: Anesthesiology

## 2019-01-30 ENCOUNTER — Other Ambulatory Visit: Payer: Self-pay

## 2019-01-30 ENCOUNTER — Ambulatory Visit (HOSPITAL_COMMUNITY)
Admission: RE | Admit: 2019-01-30 | Discharge: 2019-01-30 | Disposition: A | Discharge status: Home / Self Care | Payer: 59 | Attending: Urology | Admitting: Urology

## 2019-01-30 ENCOUNTER — Encounter (HOSPITAL_COMMUNITY)
Admission: RE | Disposition: A | Discharge status: Home / Self Care | Payer: Self-pay | Source: Home / Self Care | Attending: Urology

## 2019-01-30 ENCOUNTER — Encounter (HOSPITAL_COMMUNITY): Payer: Self-pay | Admitting: Emergency Medicine

## 2019-01-30 DIAGNOSIS — G4733 Obstructive sleep apnea (adult) (pediatric): Secondary | ICD-10-CM | POA: Insufficient documentation

## 2019-01-30 DIAGNOSIS — Z87442 Personal history of urinary calculi: Secondary | ICD-10-CM | POA: Insufficient documentation

## 2019-01-30 DIAGNOSIS — Z79899 Other long term (current) drug therapy: Secondary | ICD-10-CM | POA: Insufficient documentation

## 2019-01-30 DIAGNOSIS — N200 Calculus of kidney: Secondary | ICD-10-CM | POA: Diagnosis not present

## 2019-01-30 DIAGNOSIS — Z96652 Presence of left artificial knee joint: Secondary | ICD-10-CM | POA: Insufficient documentation

## 2019-01-30 DIAGNOSIS — E559 Vitamin D deficiency, unspecified: Secondary | ICD-10-CM | POA: Insufficient documentation

## 2019-01-30 HISTORY — PX: HOLMIUM LASER APPLICATION: SHX5852

## 2019-01-30 HISTORY — PX: CYSTOSCOPY WITH RETROGRADE PYELOGRAM, URETEROSCOPY AND STENT PLACEMENT: SHX5789

## 2019-01-30 SURGERY — CYSTOURETEROSCOPY, WITH RETROGRADE PYELOGRAM AND STENT INSERTION
Anesthesia: General | Site: Ureter | Laterality: Left

## 2019-01-30 MED ORDER — FENTANYL CITRATE (PF) 100 MCG/2ML IJ SOLN
INTRAMUSCULAR | Status: DC | PRN
  Administered 2019-01-30 (×4): 25 ug via INTRAVENOUS

## 2019-01-30 MED ORDER — OXYCODONE HCL 5 MG PO TABS: 5.0000 mg | ORAL_TABLET | ORAL | 0 refills | Status: DC | PRN

## 2019-01-30 MED ORDER — ACETAMINOPHEN 500 MG PO TABS
1000.0000 mg | ORAL_TABLET | Freq: Once | ORAL | Status: AC
  Administered 2019-01-30: 1000 mg via ORAL

## 2019-01-30 MED ORDER — PHENYLEPHRINE 40 MCG/ML (10ML) SYRINGE FOR IV PUSH (FOR BLOOD PRESSURE SUPPORT)
PREFILLED_SYRINGE | INTRAVENOUS | Status: DC | PRN
  Administered 2019-01-30: 120 ug via INTRAVENOUS
  Administered 2019-01-30 (×2): 80 ug via INTRAVENOUS

## 2019-01-30 MED ORDER — ONDANSETRON HCL 4 MG/2ML IJ SOLN
INTRAMUSCULAR | Status: AC
  Filled 2019-01-30: qty 2

## 2019-01-30 MED ORDER — IOHEXOL 300 MG/ML  SOLN
INTRAMUSCULAR | Status: DC | PRN
  Administered 2019-01-30: 10 mL

## 2019-01-30 MED ORDER — FENTANYL CITRATE (PF) 100 MCG/2ML IJ SOLN
25.0000 ug | INTRAMUSCULAR | Status: DC | PRN
  Administered 2019-01-30 (×2): 25 ug via INTRAVENOUS

## 2019-01-30 MED ORDER — FENTANYL CITRATE (PF) 100 MCG/2ML IJ SOLN
INTRAMUSCULAR | Status: AC
  Filled 2019-01-30: qty 2

## 2019-01-30 MED ORDER — PHENYLEPHRINE 40 MCG/ML (10ML) SYRINGE FOR IV PUSH (FOR BLOOD PRESSURE SUPPORT)
PREFILLED_SYRINGE | INTRAVENOUS | Status: AC
  Filled 2019-01-30: qty 10

## 2019-01-30 MED ORDER — KETOROLAC TROMETHAMINE 30 MG/ML IJ SOLN
INTRAMUSCULAR | Status: DC | PRN
  Administered 2019-01-30: 30 mg via INTRAVENOUS

## 2019-01-30 MED ORDER — DIPHENHYDRAMINE HCL 50 MG/ML IJ SOLN
INTRAMUSCULAR | Status: AC
  Filled 2019-01-30: qty 1

## 2019-01-30 MED ORDER — MIDAZOLAM HCL 2 MG/2ML IJ SOLN
INTRAMUSCULAR | Status: AC
  Filled 2019-01-30: qty 2

## 2019-01-30 MED ORDER — LACTATED RINGERS IV SOLN
INTRAVENOUS | Status: DC
  Administered 2019-01-30 (×2): via INTRAVENOUS

## 2019-01-30 MED ORDER — FENTANYL CITRATE (PF) 100 MCG/2ML IJ SOLN
INTRAMUSCULAR | Status: AC
  Administered 2019-01-30: 25 ug via INTRAVENOUS
  Filled 2019-01-30: qty 2

## 2019-01-30 MED ORDER — SCOPOLAMINE 1 MG/3DAYS TD PT72
1.0000 | MEDICATED_PATCH | TRANSDERMAL | Status: DC
  Administered 2019-01-30: 1.5 mg via TRANSDERMAL

## 2019-01-30 MED ORDER — KETOROLAC TROMETHAMINE 30 MG/ML IJ SOLN
INTRAMUSCULAR | Status: AC
  Filled 2019-01-30: qty 1

## 2019-01-30 MED ORDER — PROMETHAZINE HCL 25 MG/ML IJ SOLN: 6.2500 mg | INTRAMUSCULAR | Status: DC | PRN

## 2019-01-30 MED ORDER — 0.9 % SODIUM CHLORIDE (POUR BTL) OPTIME
TOPICAL | Status: DC | PRN
  Administered 2019-01-30: 1000 mL

## 2019-01-30 MED ORDER — DEXAMETHASONE SODIUM PHOSPHATE 10 MG/ML IJ SOLN
INTRAMUSCULAR | Status: DC | PRN
  Administered 2019-01-30: 10 mg via INTRAVENOUS

## 2019-01-30 MED ORDER — SODIUM CHLORIDE 0.9 % IR SOLN
Status: DC | PRN
  Administered 2019-01-30: 6000 mL

## 2019-01-30 MED ORDER — ACETAMINOPHEN 500 MG PO TABS
ORAL_TABLET | ORAL | Status: AC
  Filled 2019-01-30: qty 2

## 2019-01-30 MED ORDER — PROPOFOL 10 MG/ML IV BOLUS
INTRAVENOUS | Status: DC | PRN
  Administered 2019-01-30: 180 mg via INTRAVENOUS
  Administered 2019-01-30: 20 mg via INTRAVENOUS

## 2019-01-30 MED ORDER — MIDAZOLAM HCL 5 MG/5ML IJ SOLN
INTRAMUSCULAR | Status: DC | PRN
  Administered 2019-01-30: 2 mg via INTRAVENOUS

## 2019-01-30 MED ORDER — DIPHENHYDRAMINE HCL 50 MG/ML IJ SOLN
INTRAMUSCULAR | Status: DC | PRN
  Administered 2019-01-30: 12.5 mg via INTRAVENOUS

## 2019-01-30 MED ORDER — LIDOCAINE 2% (20 MG/ML) 5 ML SYRINGE
INTRAMUSCULAR | Status: AC
  Filled 2019-01-30: qty 5

## 2019-01-30 MED ORDER — PROPOFOL 10 MG/ML IV BOLUS
INTRAVENOUS | Status: AC
  Filled 2019-01-30: qty 20

## 2019-01-30 MED ORDER — DEXAMETHASONE SODIUM PHOSPHATE 10 MG/ML IJ SOLN
INTRAMUSCULAR | Status: AC
  Filled 2019-01-30: qty 1

## 2019-01-30 MED ORDER — SCOPOLAMINE 1 MG/3DAYS TD PT72
MEDICATED_PATCH | TRANSDERMAL | Status: AC
  Filled 2019-01-30: qty 1

## 2019-01-30 MED ORDER — LIDOCAINE 2% (20 MG/ML) 5 ML SYRINGE
INTRAMUSCULAR | Status: DC | PRN
  Administered 2019-01-30: 100 mg via INTRAVENOUS

## 2019-01-30 MED ORDER — CEFAZOLIN SODIUM-DEXTROSE 2-4 GM/100ML-% IV SOLN
2.0000 g | INTRAVENOUS | Status: AC
  Administered 2019-01-30: 2 g via INTRAVENOUS
  Filled 2019-01-30 (×2): qty 100

## 2019-01-30 MED ORDER — ONDANSETRON HCL 4 MG/2ML IJ SOLN
INTRAMUSCULAR | Status: DC | PRN
  Administered 2019-01-30: 4 mg via INTRAVENOUS

## 2019-01-30 SURGICAL SUPPLY — 24 items
BAG URO CATCHER STRL LF (MISCELLANEOUS) ×3 IMPLANT
CATH INTERMIT  6FR 70CM (CATHETERS) ×3 IMPLANT
CLOTH BEACON ORANGE TIMEOUT ST (SAFETY) ×3 IMPLANT
COVER SURGICAL LIGHT HANDLE (MISCELLANEOUS) ×1 IMPLANT
COVER WAND RF STERILE (DRAPES) IMPLANT
EXTRACTOR STONE NITINOL NGAGE (UROLOGICAL SUPPLIES) IMPLANT
FIBER LASER FLEXIVA 1000 (UROLOGICAL SUPPLIES) IMPLANT
FIBER LASER FLEXIVA 365 (UROLOGICAL SUPPLIES) IMPLANT
FIBER LASER FLEXIVA 550 (UROLOGICAL SUPPLIES) IMPLANT
FIBER LASER TRAC TIP (UROLOGICAL SUPPLIES) ×2 IMPLANT
GLOVE BIO SURGEON STRL SZ8 (GLOVE) ×3 IMPLANT
GOWN STRL REUS W/TWL XL LVL3 (GOWN DISPOSABLE) ×3 IMPLANT
GUIDEWIRE ANG ZIPWIRE 038X150 (WIRE) ×3 IMPLANT
GUIDEWIRE STR DUAL SENSOR (WIRE) ×2 IMPLANT
IV NS 1000ML (IV SOLUTION) ×3
IV NS 1000ML BAXH (IV SOLUTION) ×1 IMPLANT
MANIFOLD NEPTUNE II (INSTRUMENTS) ×3 IMPLANT
PACK CYSTO (CUSTOM PROCEDURE TRAY) ×3 IMPLANT
SHEATH URETERAL 12FRX35CM (MISCELLANEOUS) ×2 IMPLANT
STENT URET 6FRX24 CONTOUR (STENTS) ×2 IMPLANT
STENT URET 6FRX26 CONTOUR (STENTS) ×2 IMPLANT
TUBE FEEDING 8FR 16IN STR KANG (MISCELLANEOUS) IMPLANT
TUBING CONNECTING 10 (TUBING) ×2 IMPLANT
TUBING CONNECTING 10' (TUBING) ×1

## 2019-01-30 NOTE — Transfer of Care (Signed)
Immediate Anesthesia Transfer of Care Note  Patient: Latasha Hicks  Procedure(s) Performed: CYSTOSCOPY WITH RETROGRADE PYELOGRAM, URETEROSCOPY AND STENT PLACEMENT (Left Ureter) HOLMIUM LASER APPLICATION (Left Ureter)  Patient Location: PACU  Anesthesia Type:General  Level of Consciousness: drowsy  Airway & Oxygen Therapy: Patient Spontanous Breathing and Patient connected to face mask oxygen  Post-op Assessment: Report given to RN and Post -op Vital signs reviewed and stable  Post vital signs: Reviewed and stable  Last Vitals:  Vitals Value Taken Time  BP 138/83 01/30/2019  2:04 PM  Temp    Pulse 53 01/30/2019  2:06 PM  Resp 13 01/30/2019  2:06 PM  SpO2 100 % 01/30/2019  2:06 PM  Vitals shown include unvalidated device data.  Last Pain:  Vitals:   01/30/19 1053  TempSrc: Oral  PainSc:       Patients Stated Pain Goal: 4 (55/37/48 2707)  Complications: No apparent anesthesia complications

## 2019-01-30 NOTE — Anesthesia Preprocedure Evaluation (Addendum)
Anesthesia Evaluation  Patient identified by MRN, date of birth, ID band Patient awake    Reviewed: Allergy & Precautions, NPO status , Patient's Chart, lab work & pertinent test results  History of Anesthesia Complications Negative for: history of anesthetic complications  Airway Mallampati: II  TM Distance: >3 FB Neck ROM: Full    Dental no notable dental hx. (+) Dental Advisory Given   Pulmonary sleep apnea ,    Pulmonary exam normal        Cardiovascular negative cardio ROS Normal cardiovascular exam     Neuro/Psych negative neurological ROS     GI/Hepatic Neg liver ROS, PUD, Hx of lap band surgery   Endo/Other  negative endocrine ROS  Renal/GU      Musculoskeletal  (+) Arthritis , Osteoarthritis,    Abdominal   Peds  Hematology negative hematology ROS (+)   Anesthesia Other Findings Day of surgery medications reviewed with the patient.  Reproductive/Obstetrics                            Anesthesia Physical  Anesthesia Plan  ASA: II  Anesthesia Plan: General   Post-op Pain Management:  [facility] for Post-op pain   Induction: Intravenous  PONV Risk Score and Plan: 4 or greater and Ondansetron, Dexamethasone, Scopolamine patch - Pre-op and Diphenhydramine  Airway Management Planned: LMA  Additional Equipment:   Intra-op Plan:   Post-operative Plan: Extubation in OR  Informed Consent: I have reviewed the patients History and Physical, chart, labs and discussed the procedure including the risks, benefits and alternatives for the proposed anesthesia with the patient or authorized representative who has indicated his/her understanding and acceptance.     Dental advisory given  Plan Discussed with: CRNA and Anesthesiologist  Anesthesia Plan Comments:        Anesthesia Quick Evaluation

## 2019-01-30 NOTE — H&P (Signed)
Urology Admission H&P  Chief Complaint: gross hematuria  History of Present Illness: [name] is a 58yo with gross hematuria and on hematuria workup she was found to have a 2 1cm left renal calculi. No flank pain. No LUTS. No fevers   Past Medical History:  Diagnosis Date  . Allergic rhinitis   . Complication of anesthesia    "hard to wake up in past"  . Endometriosis   . Hematuria    d/t kidney stones   . Hemorrhoids   . History of kidney stones   . Hydronephrosis   . Kidney stones   . Obstructive sleep apnea    used CPAP in past, no longer needed after bariatric surgery  . Osteoarthritis   . Perianal abscess 05/01/2013   6/14 7 o'clock   . Personal history of colonic polyps    Question accuracy, patient report only.  . Urinary incontinence   . VENOUS INSUFFICIENCY, CHRONIC 07/05/2007   Annotation: LE Qualifier: Diagnosis of  By: Dance CMA ([place]), Kim    . Vitamin D deficiency    Past Surgical History:  Procedure Laterality Date  . ABDOMINAL SURGERY    . ACHILLES TENDON SURGERY Left 12/2017  . BREAST CYST EXCISION Left   . COLONOSCOPY N/A 09/04/2013   Procedure: COLONOSCOPY;  Surgeon: Inda Castle, MD;  Location: [facility];  Service: Endoscopy;  Laterality: N/A;  . COLONOSCOPY W/ POLYPECTOMY    . DILATION AND CURETTAGE OF UTERUS    . HERNIA REPAIR    . HIATAL HERNIA REPAIR    . LAPAROSCOPIC CHOLECYSTECTOMY    . LAPAROSCOPIC GASTRIC BANDING    . TOTAL KNEE ARTHROPLASTY Left 09/29/2018   Procedure: LEFT TOTAL KNEE ARTHROPLASTY;  Surgeon: Leandrew Koyanagi, MD;  Location: [facility];  Service: Orthopedics;  Laterality: Left;  Marland Kitchen VAGINAL HYSTERECTOMY      Home Medications:  Current Facility-Administered Medications  Medication Dose Route Frequency Provider Last Rate Last Dose  . acetaminophen (TYLENOL) 500 MG tablet           . ceFAZolin (ANCEF) IVPB 2g/100 mL premix  2 g Intravenous 30 min Pre-Op Alyson Ingles Candee Furbish, MD      . lactated ringers infusion   Intravenous  Continuous Lillia Abed, MD 50 mL/hr at 01/30/19 1048    . scopolamine (TRANSDERM-SCOP) 1 MG/3DAYS 1.5 mg  1 patch Transdermal Q72H Duane Boston, MD   1.5 mg at 01/30/19 1113  . scopolamine (TRANSDERM-SCOP) 1 MG/3DAYS            Allergies:  Allergies  Allergen Reactions  . Clarithromycin     REACTION: gi upset  . Tetanus Toxoids Other (See Comments)    Right arm large erythem reaction spontaneously improved    Family History  Problem Relation Age of Onset  . Diabetes Mother   . Cervical cancer Mother   . Hypertension Mother    Social History:  reports that she has never smoked. She has never used smokeless tobacco. She reports current alcohol use. She reports that she does not use drugs.  Review of Systems  All other systems reviewed and are negative.   Physical Exam:  Vital signs in last 24 hours: Temp:  [98.6 F (37 C)] 98.6 F (37 C) (03/06 1053) Pulse Rate:  [53] 53 (03/06 1053) Resp:  [18] 18 (03/06 1053) BP: (116)/(75) 116/75 (03/06 1053) SpO2:  [99 %] 99 % (03/06 1053) Weight:  [104.8 kg] 104.8 kg (03/06 1043) Physical Exam  Constitutional: She is oriented  to person, place, and time. She appears well-developed and well-nourished.  HENT:  Head: Normocephalic and atraumatic.  Eyes: Pupils are equal, round, and reactive to light. EOM are normal.  Neck: Normal range of motion. No thyromegaly present.  Cardiovascular: Normal rate and regular rhythm.  Respiratory: Effort normal. No respiratory distress.  GI: Soft. She exhibits no distension.  Musculoskeletal: Normal range of motion.        General: No edema.  Neurological: She is alert and oriented to person, place, and time.  Skin: Skin is warm and dry.  Psychiatric: She has a normal mood and affect. Her behavior is normal. Judgment and thought content normal.    Laboratory Data:  No results found for this or any previous visit (from the past 24 hour(s)). No results found for this or any previous visit (from  the past 240 hour(s)). Creatinine: Recent Labs    01/27/19 1012  CREATININE 0.67   Baseline Creatinine: 0.67  Impression/Assessment:  57yo with left renal calculi  Plan:  The risks/benefits/alternatives to left ureteroscopic stone extraction was explained to the patient and she understands and wishes to proceed with surgery  Nicolette Bang 01/30/2019, 12:16 PM

## 2019-01-30 NOTE — Discharge Instructions (Signed)
Ureteral Stent Implantation, Care After °Refer to this sheet in the next few weeks. These instructions provide you with information about caring for yourself after your procedure. Your health care provider may also give you more specific instructions. Your treatment has been planned according to current medical practices, but problems sometimes occur. Call your health care provider if you have any problems or questions after your procedure. °What can I expect after the procedure? °After the procedure, it is common to have: °· Nausea. °· Mild pain when you urinate. You may feel this pain in your lower back or lower abdomen. Pain should stop within a few minutes after you urinate. This may last for up to 1 week. °· A small amount of blood in your urine for several days. °Follow these instructions at home: ° °Medicines °· Take over-the-counter and prescription medicines only as told by your health care provider. °· If you were prescribed an antibiotic medicine, take it as told by your health care provider. Do not stop taking the antibiotic even if you start to feel better. °· Do not drive for 24 hours if you received a sedative. °· Do not drive or operate heavy machinery while taking prescription pain medicines. °Activity °· Return to your normal activities as told by your health care provider. Ask your health care provider what activities are safe for you. °· Do not lift anything that is heavier than 10 lb (4.5 kg). Follow this limit for 1 week after your procedure, or for as long as told by your health care provider. °General instructions °· Watch for any blood in your urine. Call your health care provider if the amount of blood in your urine increases. °· If you have a catheter: °? Follow instructions from your health care provider about taking care of your catheter and collection bag. °? Do not take baths, swim, or use a hot tub until your health care provider approves. °· Drink enough fluid to keep your urine  clear or pale yellow. °· Keep all follow-up visits as told by your health care provider. This is important. °Contact a health care provider if: °· You have pain that gets worse or does not get better with medicine, especially pain when you urinate. °· You have difficulty urinating. °· You feel nauseous or you vomit repeatedly during a period of more than 2 days after the procedure. °Get help right away if: °· Your urine is dark red or has blood clots in it. °· You are leaking urine (have incontinence). °· The end of the stent comes out of your urethra. °· You cannot urinate. °· You have sudden, sharp, or severe pain in your abdomen or lower back. °· You have a fever. °This information is not intended to replace advice given to you by your health care provider. Make sure you discuss any questions you have with your health care provider. °Document Released: 07/15/2013 Document Revised: 04/19/2016 Document Reviewed: 05/27/2015 °Elsevier Interactive Patient Education © 2019 Elsevier Inc. ° °

## 2019-01-30 NOTE — Op Note (Signed)
.  Preoperative diagnosis: Left renal calculi  Postoperative diagnosis: Same  Procedure: 1 cystoscopy 2. Left retrograde pyelography 3.  Intraoperative fluoroscopy, under one hour, with interpretation 4.  Left ureteroscopic stone manipulation with laser lithotripsy 5.  Left 6 x 24 JJ stent placement  Attending: Rosie Fate  Anesthesia: General  Estimated blood loss: None  Drains: Left 6 x 24 JJ ureteral stent without tether  Specimens: stone for analysis  Antibiotics: ancef  Findings: left lower and mid pole stone. No hydronephrosis. No masses/lesions in the bladder. Ureteral orifices in normal anatomic location.  Indications: Patient is a 58 year old female with a history of left renal stone and who has persistent left flank pain and gross hematuria.  After discussing treatment options, she decided proceed with left ureteroscopic stone manipulation.  Procedure her in detail: The patient was brought to the operating room and a brief timeout was done to ensure correct patient, correct procedure, correct site.  General anesthesia was administered patient was placed in dorsal lithotomy position.  Her genitalia was then prepped and draped in usual sterile fashion.  A rigid 83 French cystoscope was passed in the urethra and the bladder.  Bladder was inspected free masses or lesions.  the ureteral orifices were in the normal orthotopic locations.  a 6 french ureteral catheter was then instilled into the left ureteral orifice.  a gentle retrograde was obtained and findings noted above.  we then placed a zip wire through the ureteral catheter and advanced up to the renal pelvis.  we then removed the cystoscope and cannulated the left ureteral orifice with a semirigid ureteroscope.  No stone was found in the ureter. Once we reached the UPJ a sensor wire was advanced in to the renal pelvis. We then removed the ureteroscope and advanced am 12/14 x 35cm access sheath up to the renal pelvis. We then  used the flexible ureteroscope to perform nephroscopy. We encountered the stone in the lower pole.    the pieces were then removed with a Ngage basket.    once all stone fragments were removed we then removed the access sheath under direct vision and noted no injury to the ureter. We then placed a 6 x 24 double-j ureteral stent over the original zip wire.  We then removed the wire and good coil was noted in the the renal pelvis under fluoroscopy and the bladder under direct vision. the bladder was then drained and this concluded the procedure which was well tolerated by patient.  Complications: None  Condition: Stable, extubated, transferred to PACU  Plan: Patient is to be discharged home as to follow-up in one week for stent removal.

## 2019-01-30 NOTE — Anesthesia Postprocedure Evaluation (Signed)
Anesthesia Post Note  Patient: Latasha Hicks  Procedure(s) Performed: CYSTOSCOPY WITH RETROGRADE PYELOGRAM, URETEROSCOPY AND STENT PLACEMENT (Left Ureter) HOLMIUM LASER APPLICATION (Left Ureter)     Patient location during evaluation: PACU Anesthesia Type: General Level of consciousness: sedated Pain management: pain level controlled Vital Signs Assessment: post-procedure vital signs reviewed and stable Respiratory status: spontaneous breathing and respiratory function stable Cardiovascular status: stable Postop Assessment: no apparent nausea or vomiting Anesthetic complications: no    Last Vitals:  Vitals:   01/30/19 1500 01/30/19 1515  BP: 120/79 121/81  Pulse: (!) 56 (!) 51  Resp: 12 13  Temp:  36.7 C  SpO2: 97% 93%    Last Pain:  Vitals:   01/30/19 1500  TempSrc:   PainSc: 5                  Latasha Hicks DANIEL

## 2019-02-01 ENCOUNTER — Encounter (HOSPITAL_COMMUNITY): Payer: Self-pay | Admitting: Urology

## 2019-05-01 IMAGING — MR MR ANKLE*L* W/O CM
6 series · 40 of 40 positions shown · non-contrast
Comparison: None.

CLINICAL DATA: Ankle pain, posterior tibial dysfunction. Prior
chili surgery.

EXAM:
MRI OF THE LEFT ANKLE WITHOUT CONTRAST
TECHNIQUE: Multiplanar, multisequence MR imaging of the ankle was performed. No
intravenous contrast was administered.

[Series 4: T2 fat-sat · axial · 3.0mm · 0.50mm/px · z∈[-148,-28]mm · 7 of 32 slices shown (1 of 2)]
[im 1/32]
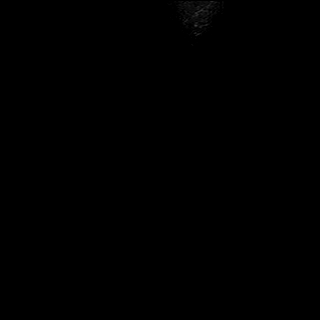
[im 6/32]
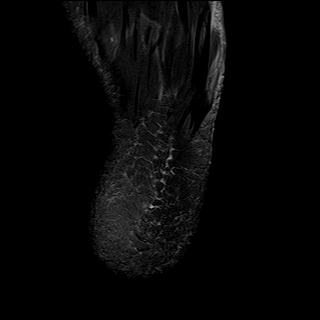
[im 11/32]
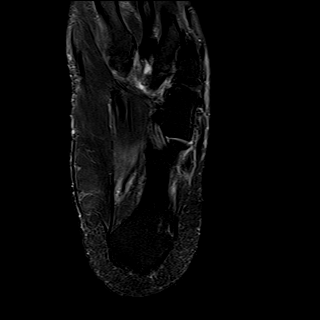
[im 16/32]
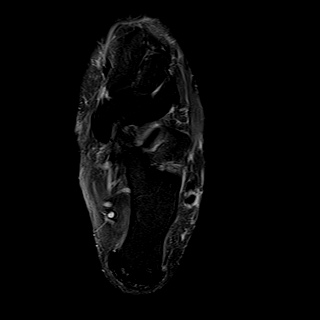
[im 21/32]
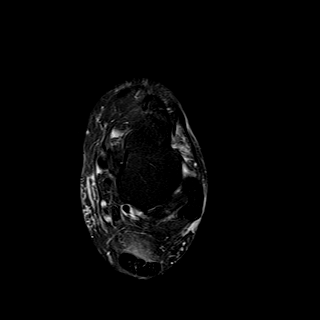
[im 26/32]
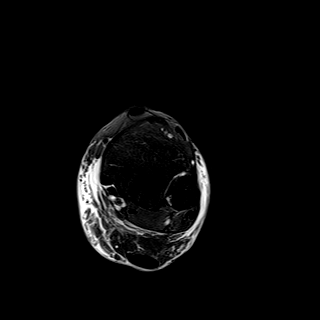
[im 32/32]
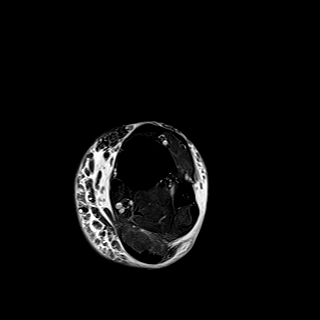

[Series 5: PD fat-sat · axial · 3.0mm · 0.42mm/px · z∈[-148,-28]mm · 8 of 32 slices shown]
[im 1/32]
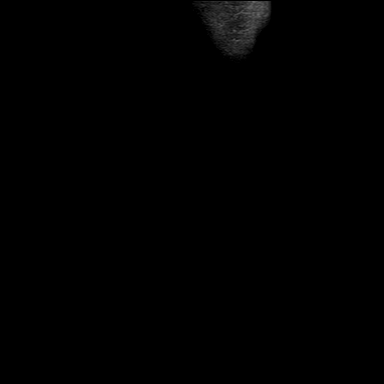
[im 5/32]
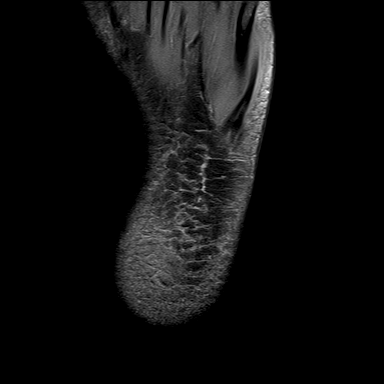
[im 9/32]
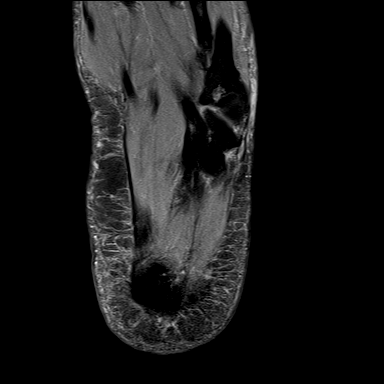
[im 14/32]
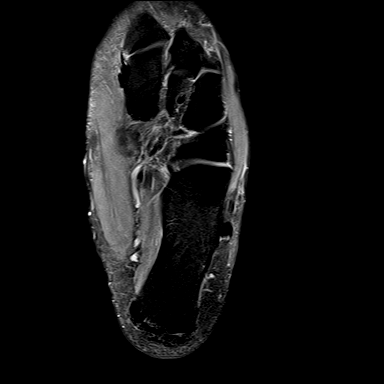
[im 18/32]
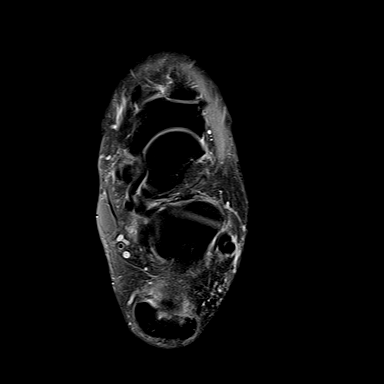
[im 23/32]
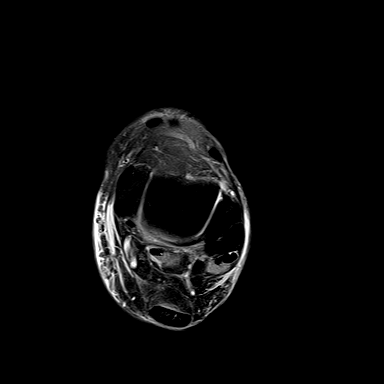
[im 27/32]
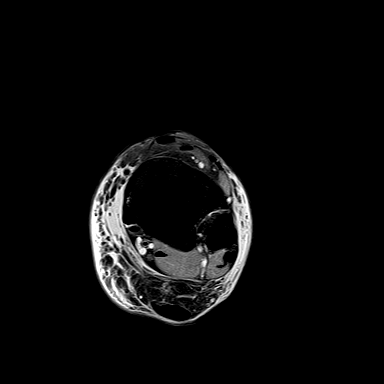
[im 32/32]
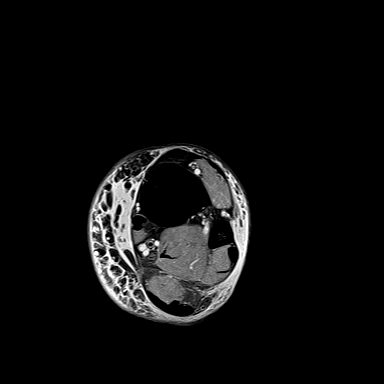

[Series 6: T1 · axial · 3.0mm · 0.50mm/px · z∈[-147,-26]mm · 8 of 32 slices shown (1 of 2)]
[im 1/32]
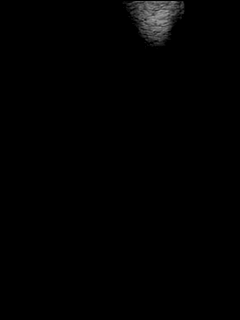
[im 5/32]
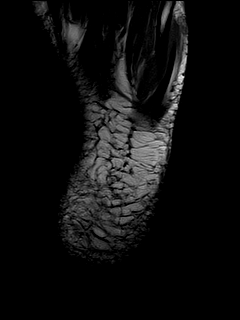
[im 9/32]
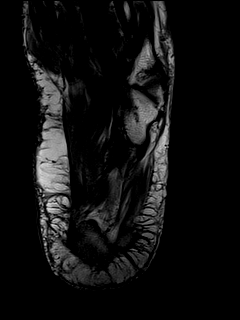
[im 14/32]
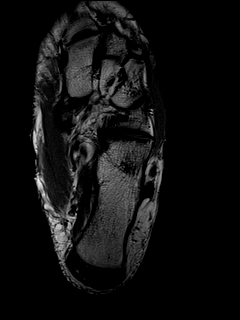
[im 18/32]
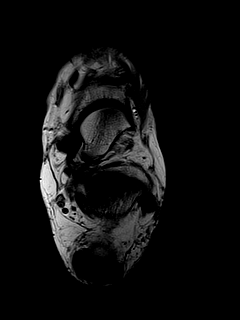
[im 23/32]
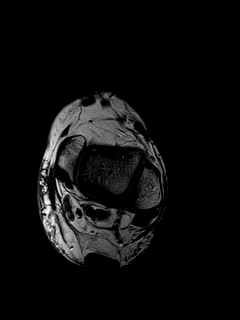
[im 27/32]
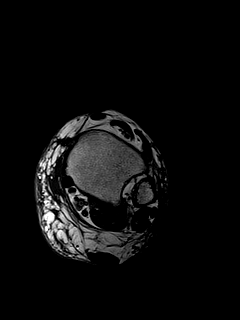
[im 32/32]
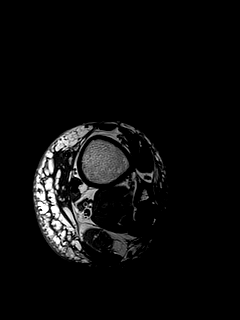

[Series 7: T1 · sagittal · 4.0mm · 0.56mm/px · 5 of 20 slices shown (2 of 2)]
[im 1/20]
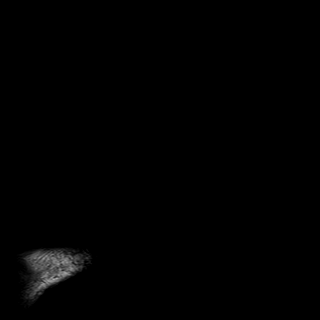
[im 5/20]
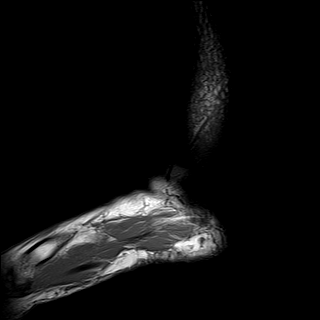
[im 10/20]
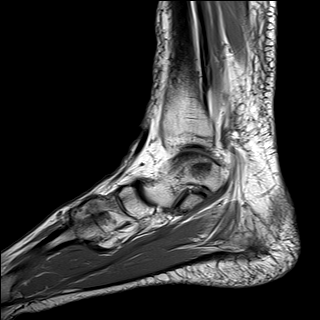
[im 15/20]
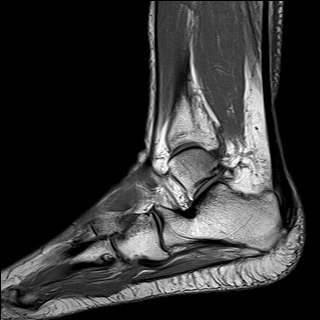
[im 20/20]
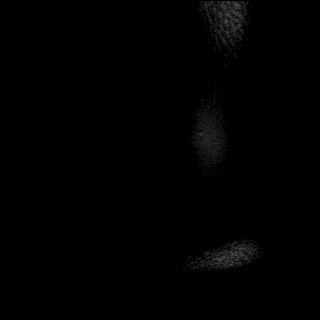

[Series 8: STIR · sagittal · 4.0mm · 0.35mm/px · 4 of 18 slices shown]
[im 1/18]
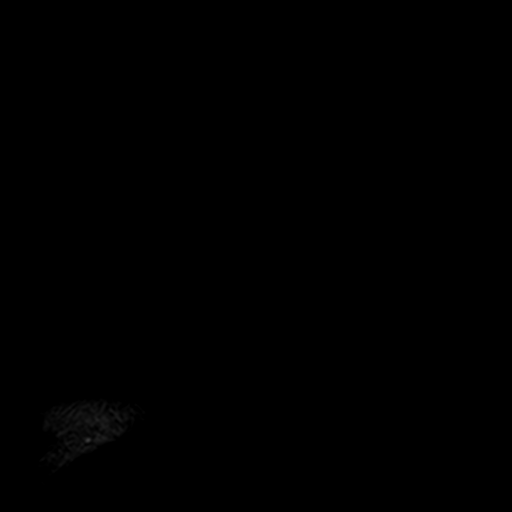
[im 6/18]
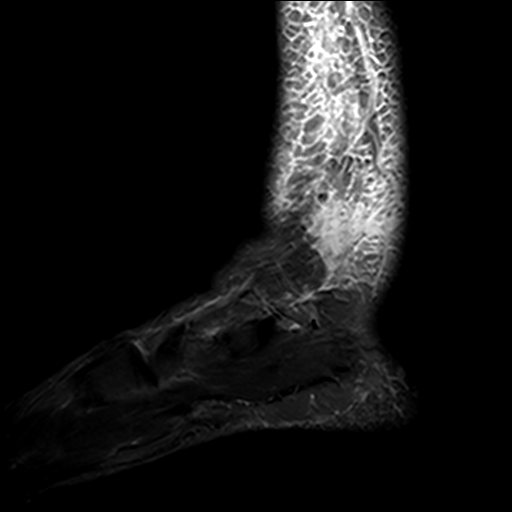
[im 12/18]
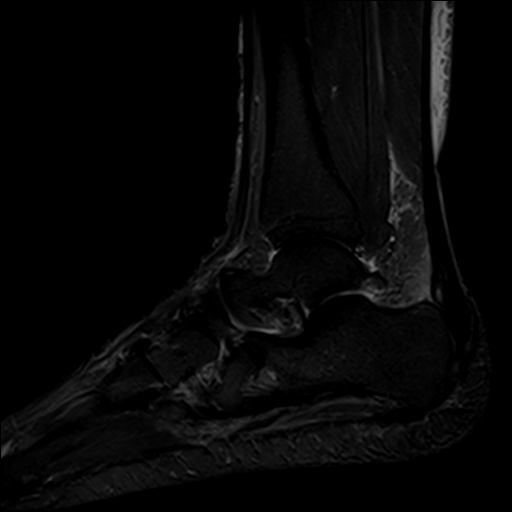
[im 18/18]
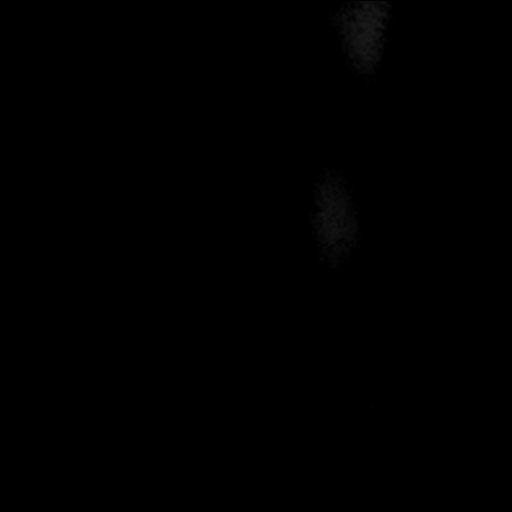

[Series 9: T2 fat-sat · coronal · 3.0mm · 0.50mm/px · 8 of 36 slices shown (2 of 2)]
[im 1/36]
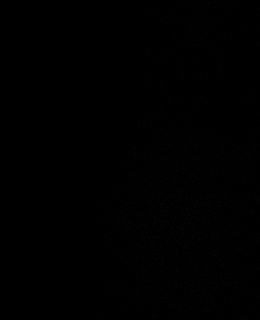
[im 6/36]
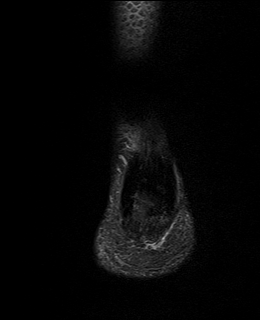
[im 11/36]
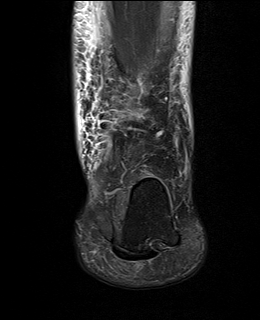
[im 16/36]
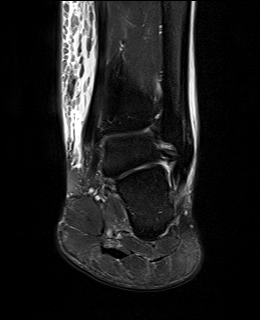
[im 21/36]
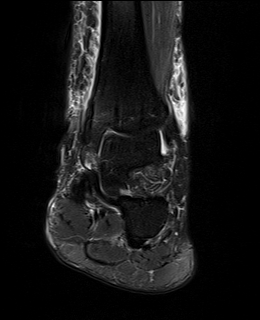
[im 26/36]
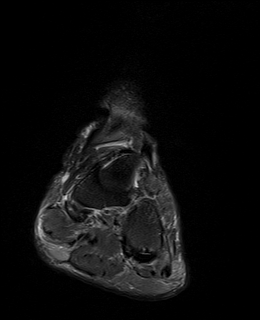
[im 31/36]
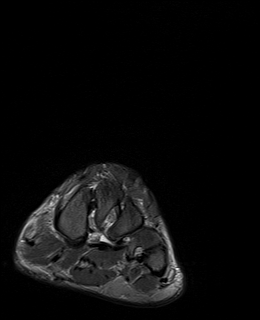
[im 36/36]
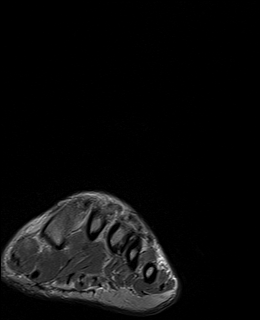

[40 of 40 positions shown; findings below may reference images not displayed]

FINDINGS: TENDONS

Peroneal: Peroneal longus tendon intact. Peroneal brevis intact.

Posteromedial: Mild tendinosis of the posterior tibial tendon at the
navicular insertion with mild tenosynovitis. Flexor hallucis longus
tendon intact. Flexor digitorum longus tendon intact.

Anterior: Tibialis anterior tendon intact. Extensor hallucis longus
tendon intact Extensor digitorum longus tendon intact.

Achilles: Severe thickening of the distal Achilles tendon at its
insertion most consistent with postsurgical changes with mild
peritenonitis. Superimposed mild tendinosis.

Plantar Fascia: Intact.

LIGAMENTS

Lateral: Anterior talofibular ligament intact. Calcaneofibular
ligament intact. Posterior talofibular ligament intact. Anterior and
posterior tibiofibular ligaments intact.

Medial: Deltoid ligament intact. Spring ligament intact.

CARTILAGE

Ankle Joint: No joint effusion. Normal ankle mortise. No chondral
defect.

Subtalar Joints/Sinus Tarsi: Normal subtalar joints. No subtalar
joint effusion. Normal sinus tarsi.

Bones: No marrow signal abnormality. No fracture or dislocation.
Mild osteoarthritis of the talonavicular joint.

Soft Tissue: No fluid collection or hematoma.  No soft tissue mass.
IMPRESSION: 1.
Mild tendinosis of the posterior tibial tendon at the navicular
insertion with mild tenosynovitis.
2. Severe thickening of the distal Achilles tendon at its insertion
most consistent with postsurgical changes with mild persistent
tendinosis and peritenonitis.

## 2019-05-04 IMAGING — CR DG CHEST 2V
2 series · 2 of 2 positions shown · non-contrast
Comparison: 01/07/2008

CLINICAL DATA: Left knee replacement, preop exam

EXAM:
CHEST - 2 VIEW

[w chest pa]
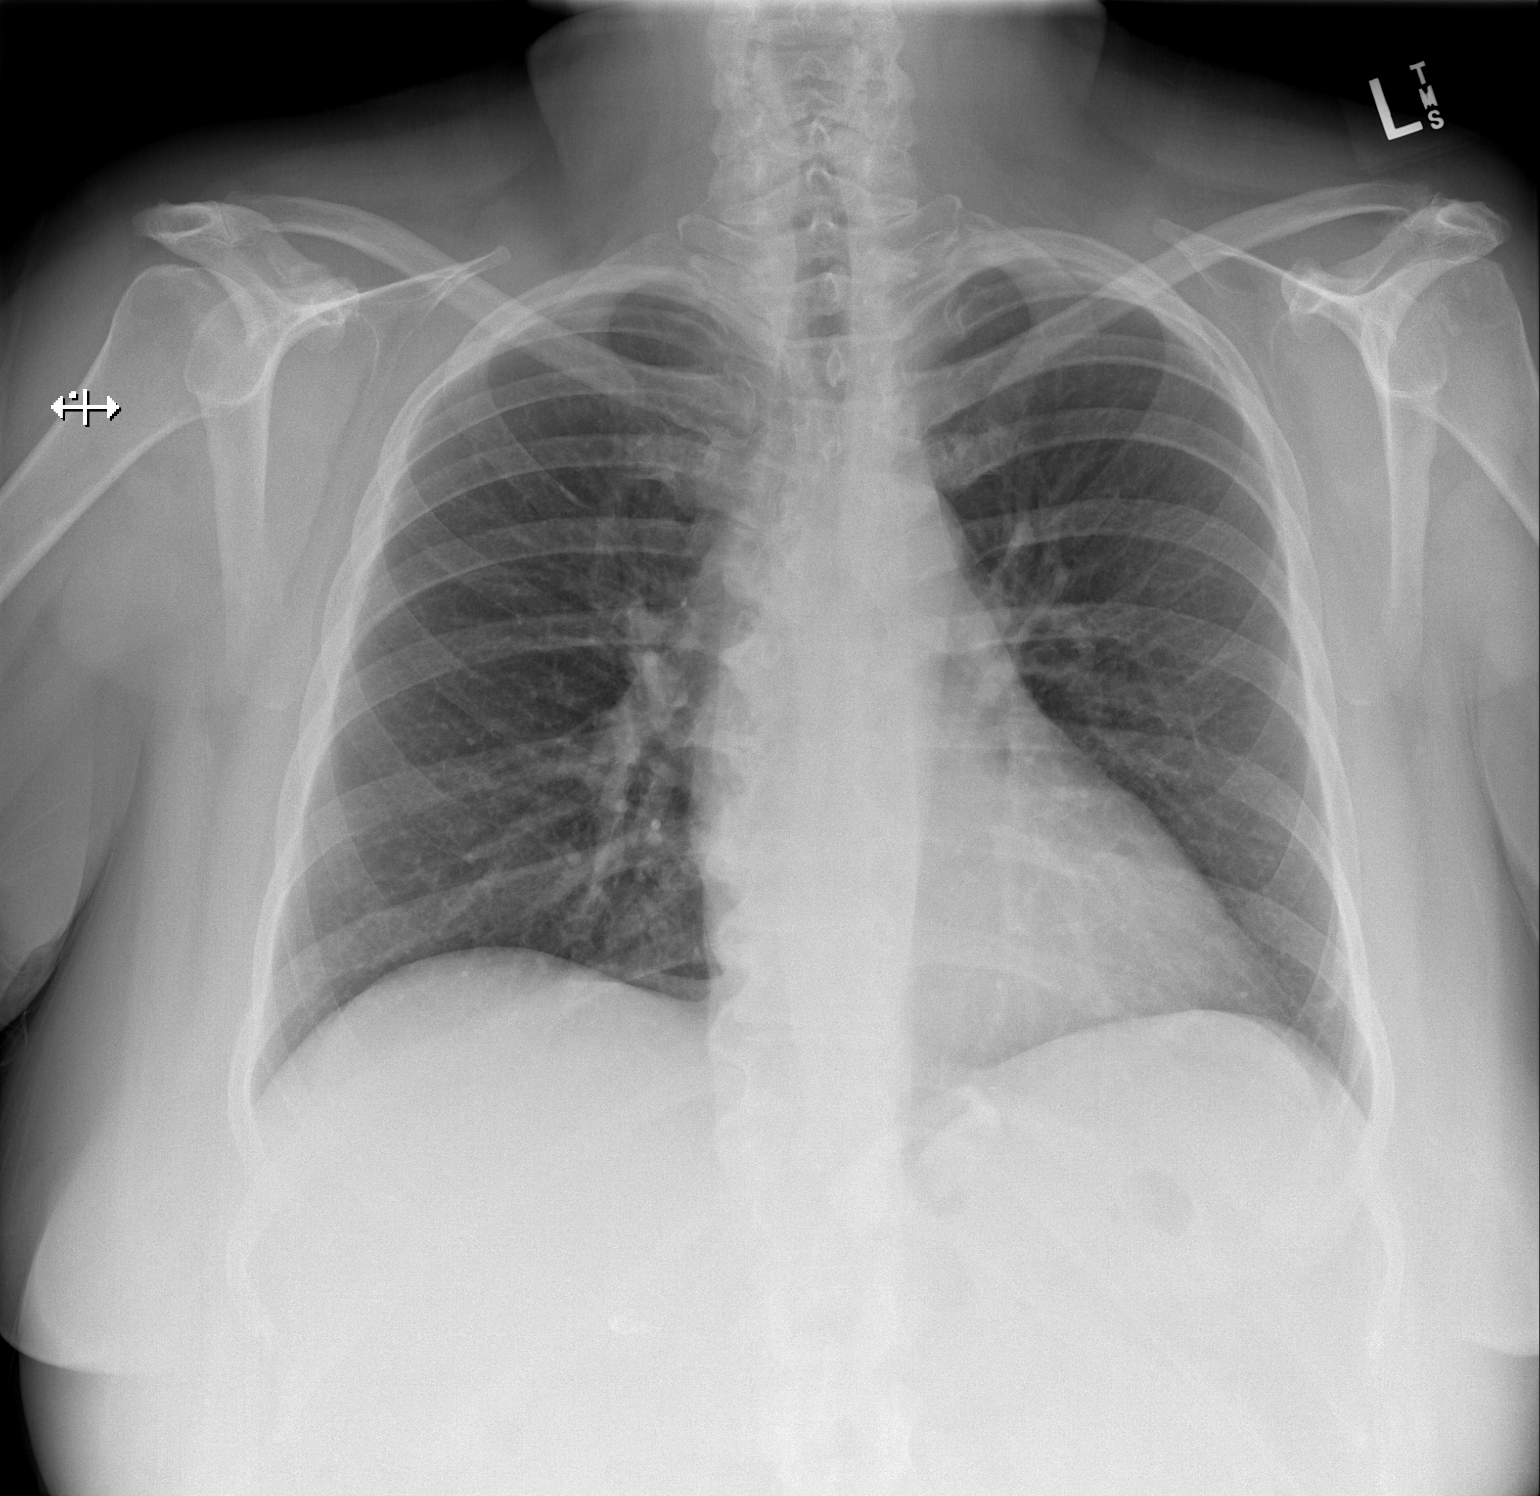

[w chest lat]
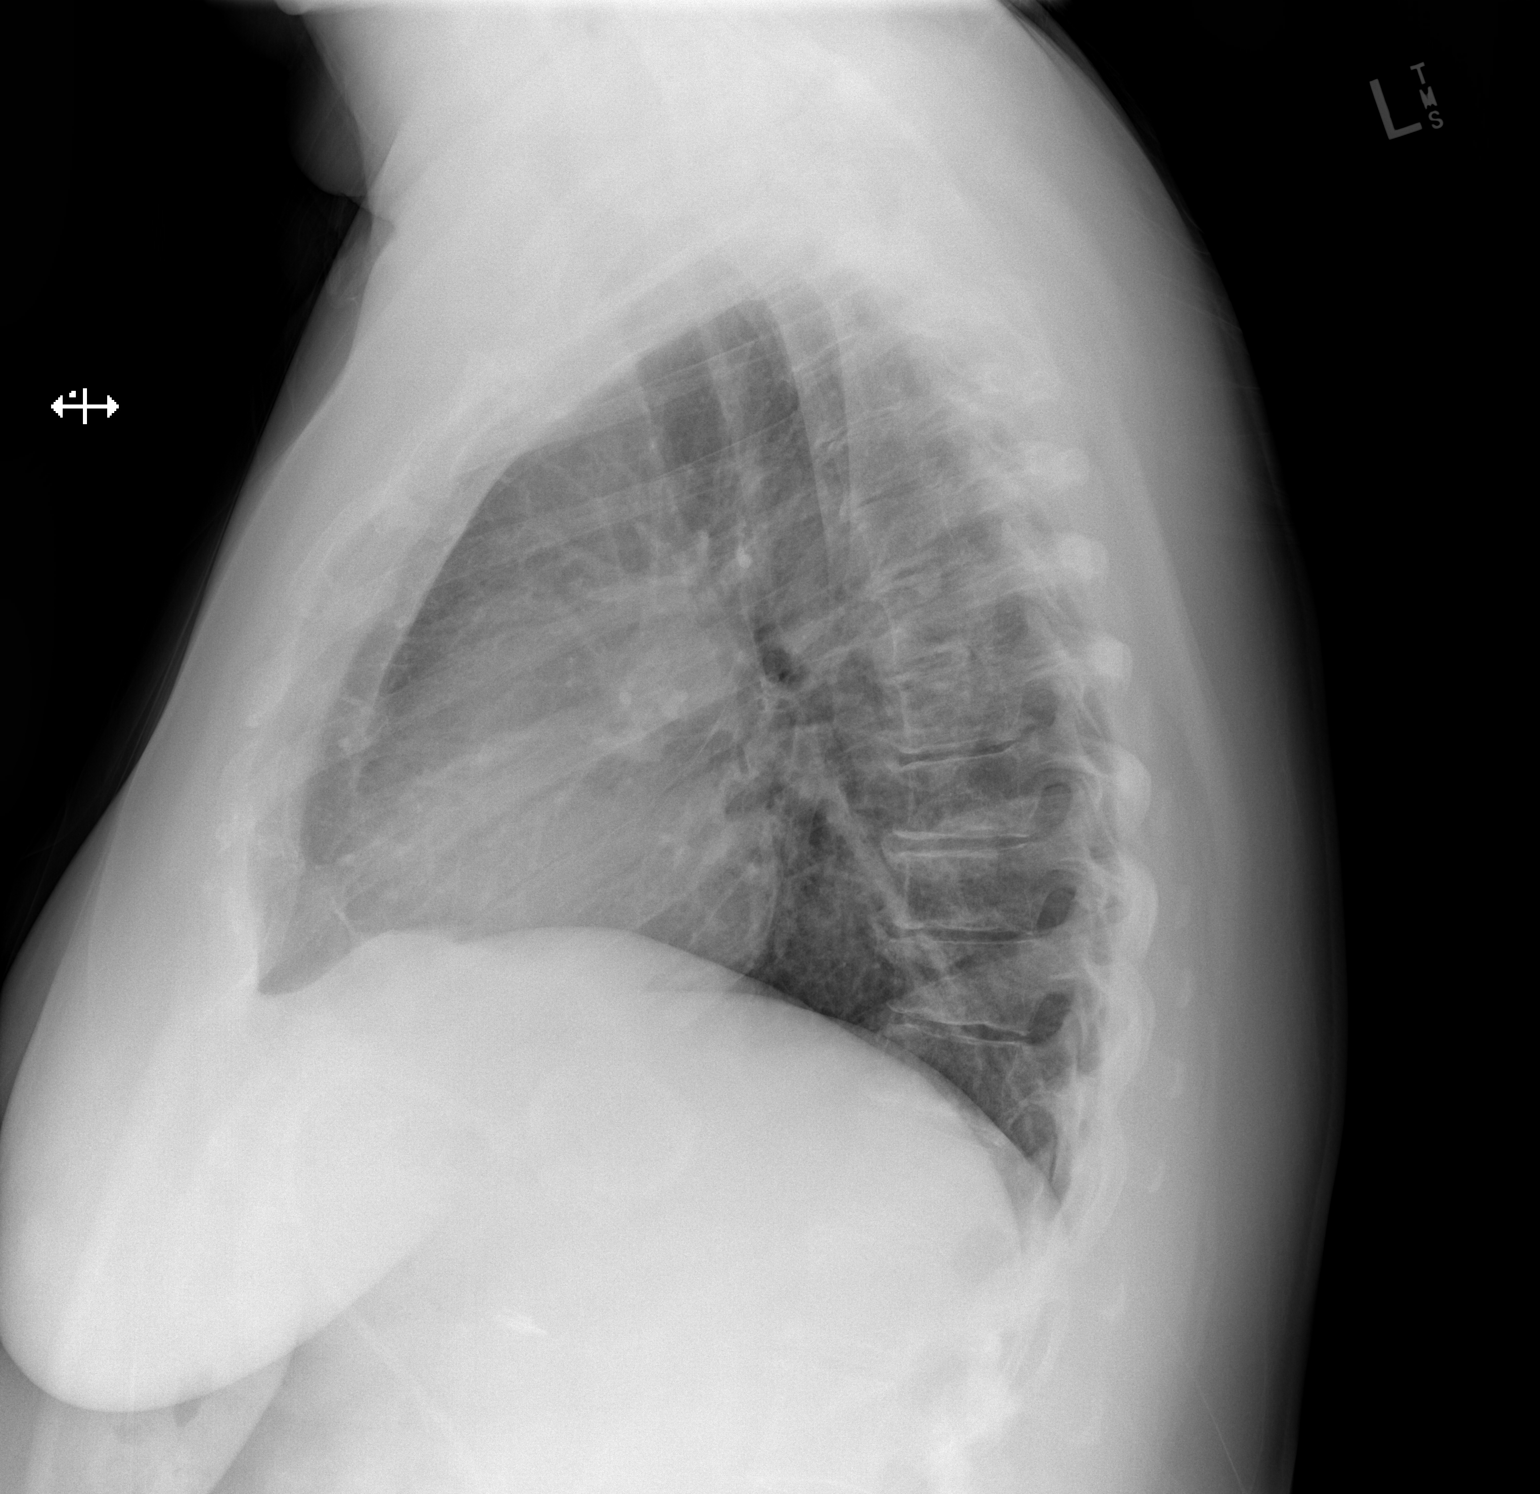

[2 of 2 positions shown; findings below may reference images not displayed]

FINDINGS: The heart size and mediastinal contours are within normal limits.
Both lungs are clear. The visualized skeletal structures are
unremarkable.

Normal heart size and vascularity. Trachea is midline. Degenerative
spondylosis of the spine. Gastric banding noted.
IMPRESSION: No active cardiopulmonary disease.

## 2019-10-08 ENCOUNTER — Telehealth: Payer: Self-pay

## 2019-10-08 NOTE — Telephone Encounter (Signed)
I agree.  Please go ahead and get tested.  Thanks

## 2019-10-08 NOTE — Telephone Encounter (Signed)
Copied from [place] [id number]. Topic: General - Other >> Oct 07, 2019  3:55 PM Leward Quan A wrote: Reason for CRM: Patient called to say that on 10/04/2019 she was exposed to someone that was later diagnosed as positive for covid 19. She states that she is not having any symptoms however she is sweaty, has a slight cough and a persistent headache. Per patient she will go and get tested for a piece of mind. But wanted Dr Alain Marion to know and to know if he have any opinions on anything she need to be doing. Please call Ph# [phone]

## 2019-10-09 NOTE — Telephone Encounter (Signed)
Pt contacted and stated that she has been tested (she went yesterday).

## 2020-03-30 ENCOUNTER — Other Ambulatory Visit: Payer: Self-pay | Admitting: Urology

## 2020-04-13 ENCOUNTER — Encounter (HOSPITAL_BASED_OUTPATIENT_CLINIC_OR_DEPARTMENT_OTHER): Payer: Self-pay | Admitting: Urology

## 2020-04-13 ENCOUNTER — Other Ambulatory Visit: Payer: Self-pay

## 2020-04-13 NOTE — Progress Notes (Signed)
Spoke w/ via phone for pre-op interview---patient Lab needs dos---- none              COVID test ------04-15-2020@1035  Arrive at -------1115 am 04-19-2020 No food after midnight, clear liquids from midnight until 715 am then npo- Medications to take morning of surgery -----none Diabetic medication -----n/a Patient Special Instructions -----none Pre-Op special Istructions -----none Patient verbalized understanding of instructions that were given at this phone interview. Patient denies shortness of breath, chest pain, fever, cough a this phone interview.

## 2020-04-15 ENCOUNTER — Other Ambulatory Visit (HOSPITAL_COMMUNITY)
Admission: RE | Admit: 2020-04-15 | Discharge: 2020-04-15 | Disposition: A | Discharge status: Home / Self Care | Payer: 59 | Source: Ambulatory Visit | Attending: Urology | Admitting: Urology

## 2020-04-15 DIAGNOSIS — Z20822 Contact with and (suspected) exposure to covid-19: Secondary | ICD-10-CM | POA: Insufficient documentation

## 2020-04-15 DIAGNOSIS — Z01812 Encounter for preprocedural laboratory examination: Secondary | ICD-10-CM | POA: Diagnosis present

## 2020-04-15 LAB — SARS CORONAVIRUS 2 (TAT 6-24 HRS): SARS Coronavirus 2: NEGATIVE

## 2020-04-19 ENCOUNTER — Ambulatory Visit (HOSPITAL_BASED_OUTPATIENT_CLINIC_OR_DEPARTMENT_OTHER): Payer: 59 | Admitting: Anesthesiology

## 2020-04-19 ENCOUNTER — Encounter (HOSPITAL_BASED_OUTPATIENT_CLINIC_OR_DEPARTMENT_OTHER)
Admission: RE | Disposition: A | Discharge status: Home / Self Care | Payer: Self-pay | Source: Ambulatory Visit | Attending: Urology

## 2020-04-19 ENCOUNTER — Ambulatory Visit (HOSPITAL_BASED_OUTPATIENT_CLINIC_OR_DEPARTMENT_OTHER)
Admission: RE | Admit: 2020-04-19 | Discharge: 2020-04-19 | Disposition: A | Discharge status: Home / Self Care | Payer: 59 | Source: Ambulatory Visit | Attending: Urology | Admitting: Urology

## 2020-04-19 ENCOUNTER — Other Ambulatory Visit: Payer: Self-pay

## 2020-04-19 ENCOUNTER — Encounter (HOSPITAL_BASED_OUTPATIENT_CLINIC_OR_DEPARTMENT_OTHER): Payer: Self-pay | Admitting: Urology

## 2020-04-19 DIAGNOSIS — Z887 Allergy status to serum and vaccine status: Secondary | ICD-10-CM | POA: Diagnosis not present

## 2020-04-19 DIAGNOSIS — Z96652 Presence of left artificial knee joint: Secondary | ICD-10-CM | POA: Diagnosis not present

## 2020-04-19 DIAGNOSIS — Z881 Allergy status to other antibiotic agents status: Secondary | ICD-10-CM | POA: Insufficient documentation

## 2020-04-19 DIAGNOSIS — I872 Venous insufficiency (chronic) (peripheral): Secondary | ICD-10-CM | POA: Diagnosis not present

## 2020-04-19 DIAGNOSIS — N200 Calculus of kidney: Secondary | ICD-10-CM | POA: Diagnosis present

## 2020-04-19 DIAGNOSIS — Z87442 Personal history of urinary calculi: Secondary | ICD-10-CM | POA: Diagnosis not present

## 2020-04-19 HISTORY — PX: CYSTOSCOPY WITH RETROGRADE PYELOGRAM, URETEROSCOPY AND STENT PLACEMENT: SHX5789

## 2020-04-19 HISTORY — PX: HOLMIUM LASER APPLICATION: SHX5852

## 2020-04-19 HISTORY — DX: Personal history of other diseases of the nervous system and sense organs: Z86.69

## 2020-04-19 SURGERY — CYSTOURETEROSCOPY, WITH RETROGRADE PYELOGRAM AND STENT INSERTION
Anesthesia: General | Site: Pelvis | Laterality: Bilateral

## 2020-04-19 MED ORDER — FENTANYL CITRATE (PF) 100 MCG/2ML IJ SOLN
INTRAMUSCULAR | Status: AC
  Filled 2020-04-19: qty 2

## 2020-04-19 MED ORDER — OXYCODONE-ACETAMINOPHEN 5-325 MG PO TABS
1.0000 | ORAL_TABLET | Freq: Once | ORAL | Status: AC
  Administered 2020-04-19: 1 via ORAL

## 2020-04-19 MED ORDER — ONDANSETRON 4 MG PO TBDP
4.0000 mg | ORAL_TABLET | Freq: Three times a day (TID) | ORAL | 0 refills | Status: DC | PRN
Start: 2020-04-19 — End: 2021-05-23

## 2020-04-19 MED ORDER — LIDOCAINE 2% (20 MG/ML) 5 ML SYRINGE
INTRAMUSCULAR | Status: DC | PRN
  Administered 2020-04-19: 60 mg via INTRAVENOUS

## 2020-04-19 MED ORDER — SODIUM CHLORIDE 0.9 % IR SOLN
Status: DC | PRN
  Administered 2020-04-19: 3000 mL

## 2020-04-19 MED ORDER — MIDAZOLAM HCL 2 MG/2ML IJ SOLN
INTRAMUSCULAR | Status: AC
  Filled 2020-04-19: qty 2

## 2020-04-19 MED ORDER — OXYCODONE-ACETAMINOPHEN 5-325 MG PO TABS
ORAL_TABLET | ORAL | Status: AC
  Filled 2020-04-19: qty 1

## 2020-04-19 MED ORDER — KETOROLAC TROMETHAMINE 30 MG/ML IJ SOLN
INTRAMUSCULAR | Status: DC | PRN
  Administered 2020-04-19: 30 mg via INTRAVENOUS

## 2020-04-19 MED ORDER — KETOROLAC TROMETHAMINE 30 MG/ML IJ SOLN
INTRAMUSCULAR | Status: AC
  Filled 2020-04-19: qty 1

## 2020-04-19 MED ORDER — ONDANSETRON HCL 4 MG/2ML IJ SOLN
INTRAMUSCULAR | Status: AC
  Filled 2020-04-19: qty 2

## 2020-04-19 MED ORDER — PROPOFOL 10 MG/ML IV BOLUS
INTRAVENOUS | Status: AC
  Filled 2020-04-19: qty 20

## 2020-04-19 MED ORDER — MIDAZOLAM HCL 5 MG/5ML IJ SOLN
INTRAMUSCULAR | Status: DC | PRN
  Administered 2020-04-19: 2 mg via INTRAVENOUS

## 2020-04-19 MED ORDER — FENTANYL CITRATE (PF) 100 MCG/2ML IJ SOLN
INTRAMUSCULAR | Status: DC | PRN
  Administered 2020-04-19: 50 ug via INTRAVENOUS
  Administered 2020-04-19 (×2): 25 ug via INTRAVENOUS

## 2020-04-19 MED ORDER — CEFAZOLIN SODIUM-DEXTROSE 2-4 GM/100ML-% IV SOLN
INTRAVENOUS | Status: AC
  Filled 2020-04-19: qty 100

## 2020-04-19 MED ORDER — ONDANSETRON HCL 4 MG/2ML IJ SOLN
INTRAMUSCULAR | Status: DC | PRN
  Administered 2020-04-19: 4 mg via INTRAVENOUS

## 2020-04-19 MED ORDER — DEXAMETHASONE SODIUM PHOSPHATE 10 MG/ML IJ SOLN
INTRAMUSCULAR | Status: DC | PRN
  Administered 2020-04-19: 10 mg via INTRAVENOUS

## 2020-04-19 MED ORDER — IOHEXOL 300 MG/ML  SOLN
INTRAMUSCULAR | Status: DC | PRN
  Administered 2020-04-19: 6 mL via URETHRAL

## 2020-04-19 MED ORDER — DEXAMETHASONE SODIUM PHOSPHATE 10 MG/ML IJ SOLN
INTRAMUSCULAR | Status: AC
  Filled 2020-04-19: qty 1

## 2020-04-19 MED ORDER — LACTATED RINGERS IV SOLN: INTRAVENOUS | Status: DC

## 2020-04-19 MED ORDER — OXYCODONE-ACETAMINOPHEN 5-325 MG PO TABS: 1.0000 | ORAL_TABLET | ORAL | 0 refills | Status: DC | PRN

## 2020-04-19 MED ORDER — CEFAZOLIN SODIUM-DEXTROSE 2-4 GM/100ML-% IV SOLN
2.0000 g | INTRAVENOUS | Status: AC
  Administered 2020-04-19: 2 g via INTRAVENOUS

## 2020-04-19 MED ORDER — FENTANYL CITRATE (PF) 100 MCG/2ML IJ SOLN
25.0000 ug | INTRAMUSCULAR | Status: DC | PRN
  Administered 2020-04-19 (×2): 25 ug via INTRAVENOUS

## 2020-04-19 MED ORDER — ONDANSETRON HCL 4 MG/2ML IJ SOLN: 4.0000 mg | Freq: Once | INTRAMUSCULAR | Status: DC | PRN

## 2020-04-19 MED ORDER — PROPOFOL 10 MG/ML IV BOLUS
INTRAVENOUS | Status: DC | PRN
  Administered 2020-04-19: 150 mg via INTRAVENOUS

## 2020-04-19 MED ORDER — ACETAMINOPHEN 500 MG PO TABS: 1000.0000 mg | ORAL_TABLET | Freq: Once | ORAL | Status: DC

## 2020-04-19 MED ORDER — LIDOCAINE 2% (20 MG/ML) 5 ML SYRINGE
INTRAMUSCULAR | Status: AC
  Filled 2020-04-19: qty 5

## 2020-04-19 SURGICAL SUPPLY — 29 items
BAG DRAIN URO-CYSTO SKYTR STRL (DRAIN) ×3 IMPLANT
BAG DRN UROCATH (DRAIN) ×1
BASKET STONE 1.7 NGAGE (UROLOGICAL SUPPLIES) ×2 IMPLANT
CATH INTERMIT  6FR 70CM (CATHETERS) ×2 IMPLANT
CLOTH BEACON ORANGE TIMEOUT ST (SAFETY) ×1 IMPLANT
EVACUATOR MICROVAS BLADDER (UROLOGICAL SUPPLIES) IMPLANT
EXTRACTOR STONE 1.7FRX115CM (UROLOGICAL SUPPLIES) IMPLANT
FIBER LASER FLEXIVA 1000 (UROLOGICAL SUPPLIES) IMPLANT
FIBER LASER FLEXIVA 365 (UROLOGICAL SUPPLIES) IMPLANT
FIBER LASER FLEXIVA 550 (UROLOGICAL SUPPLIES) IMPLANT
FIBER LASER TRAC TIP (UROLOGICAL SUPPLIES) IMPLANT
GLOVE BIO SURGEON STRL SZ8 (GLOVE) ×3 IMPLANT
GOWN STRL REUS W/TWL XL LVL3 (GOWN DISPOSABLE) ×3 IMPLANT
GUIDEWIRE STR DUAL SENSOR (WIRE) ×2 IMPLANT
GUIDEWIRE ZIPWRE .038 STRAIGHT (WIRE) ×3 IMPLANT
IV NS 1000ML (IV SOLUTION)
IV NS 1000ML BAXH (IV SOLUTION) ×1 IMPLANT
IV NS IRRIG 3000ML ARTHROMATIC (IV SOLUTION) ×3 IMPLANT
KIT TURNOVER CYSTO (KITS) ×3 IMPLANT
MANIFOLD NEPTUNE II (INSTRUMENTS) ×3 IMPLANT
NS IRRIG 500ML POUR BTL (IV SOLUTION) ×3 IMPLANT
SHEATH URETERAL 12FRX35CM (MISCELLANEOUS) ×2 IMPLANT
STENT URET 6FRX24 CONTOUR (STENTS) ×4 IMPLANT
STENT URET 6FRX26 CONTOUR (STENTS) IMPLANT
SYR 10ML LL (SYRINGE) ×3 IMPLANT
TRAY CYSTO PACK (CUSTOM PROCEDURE TRAY) ×3 IMPLANT
TUBE CONNECTING 12'X1/4 (SUCTIONS) ×1
TUBE CONNECTING 12X1/4 (SUCTIONS) ×1 IMPLANT
TUBING UROLOGY SET (TUBING) ×3 IMPLANT

## 2020-04-19 NOTE — H&P (View-Only) (Signed)
Urology Admission H&P  Chief Complaint: bilateral renal calculi  History of Present Illness: Latasha Hicks is a 59yo with a hx of nephrolithiasis who was found to have multiple bilateral renal calculi on renal US. She has intermittent bilateral flank pain which is sharp, intermittent mild to moderate. No LUTS. No fevers  Past Medical History:  Diagnosis Date  . Allergic rhinitis   . Complication of anesthesia    "hard to wake up in past"  . Endometriosis   . Hematuria    d/t kidney stones   . History of kidney stones   . History of sleep apnea    NO OSA SINCE WEIGHT LOSS SURGERY AND WEIGHT LOSS 2009  . Hydronephrosis   . Kidney stones   . Osteoarthritis   . Personal history of colonic polyps    Question accuracy, patient report only.  . Urinary incontinence   . VENOUS INSUFFICIENCY, CHRONIC 07/05/2007   Annotation: LE Qualifier: Diagnosis of  By: Dance CMA ([place]), Kim    . Vitamin D deficiency    Past Surgical History:  Procedure Laterality Date  . ABDOMINAL SURGERY    . ACHILLES TENDON SURGERY Left 12/2017  . BREAST CYST EXCISION Left   . COLONOSCOPY N/A 09/04/2013   Procedure: COLONOSCOPY;  Surgeon: Inda Castle, MD;  Location: [facility];  Service: Endoscopy;  Laterality: N/A;  . COLONOSCOPY W/ POLYPECTOMY    . CYSTOSCOPY WITH RETROGRADE PYELOGRAM, URETEROSCOPY AND STENT PLACEMENT Left 01/30/2019   Procedure: CYSTOSCOPY WITH RETROGRADE PYELOGRAM, URETEROSCOPY AND STENT PLACEMENT;  Surgeon: Cleon Gustin, MD;  Location: [facility];  Service: Urology;  Laterality: Left;  1 HR  . DILATION AND CURETTAGE OF UTERUS    . HIATAL HERNIA REPAIR    . HOLMIUM LASER APPLICATION Left [id number]   Procedure: HOLMIUM LASER APPLICATION;  Surgeon: Cleon Gustin, MD;  Location: [facility];  Service: Urology;  Laterality: Left;  . lap band replacement  2012  . LAPAROSCOPIC CHOLECYSTECTOMY    . LAPAROSCOPIC GASTRIC BANDING  2009  . TOTAL KNEE ARTHROPLASTY Left 09/29/2018   Procedure:  LEFT TOTAL KNEE ARTHROPLASTY;  Surgeon: Leandrew Koyanagi, MD;  Location: [facility];  Service: Orthopedics;  Laterality: Left;  Marland Kitchen VAGINAL HYSTERECTOMY     partial    Home Medications:  Current Facility-Administered Medications  Medication Dose Route Frequency Provider Last Rate Last Admin  . acetaminophen (TYLENOL) tablet 1,000 mg  1,000 mg Oral Once Suzette Battiest, MD      . ceFAZolin (ANCEF) IVPB 2g/100 mL premix  2 g Intravenous 30 min Pre-Op Alyson Ingles Candee Furbish, MD      . lactated ringers infusion   Intravenous Continuous Lyn Hollingshead, MD 50 mL/hr at 04/19/20 1148 New Bag at 04/19/20 1148   Allergies:  Allergies  Allergen Reactions  . Clarithromycin     REACTION: gi upset  . Tetanus Toxoids Other (See Comments)    Right arm large erythem reaction spontaneously improved    Family History  Problem Relation Age of Onset  . Diabetes Mother   . Cervical cancer Mother   . Hypertension Mother    Social History:  reports that she has never smoked. She has never used smokeless tobacco. She reports current alcohol use. She reports that she does not use drugs.  Review of Systems  Genitourinary: Positive for flank pain.  All other systems reviewed and are negative.   Physical Exam:  Vital signs in last 24 hours: Temp:  [98.7 F (37.1 C)] 98.7 F (37.1  C) (05/25 1145) Pulse Rate:  [65] 65 (05/25 1145) Resp:  [18] 18 (05/25 1145) BP: (131)/(79) 131/79 (05/25 1145) SpO2:  [99 %] 99 % (05/25 1145) Weight:  [107.5 kg] 107.5 kg (05/25 1145) Physical Exam  Constitutional: She is oriented to person, place, and time. She appears well-developed and well-nourished.  HENT:  Head: Normocephalic and atraumatic.  Eyes: Pupils are equal, round, and reactive to light. EOM are normal.  Neck: No thyromegaly present.  Cardiovascular: Normal rate and regular rhythm.  Respiratory: Effort normal. No respiratory distress.  GI: Soft. She exhibits no distension.  Musculoskeletal:         General: No edema. Normal range of motion.     Cervical back: Normal range of motion.  Neurological: She is alert and oriented to person, place, and time.  Skin: Skin is warm and dry.  Psychiatric: She has a normal mood and affect. Her behavior is normal. Judgment and thought content normal.    Laboratory Data:  No results found for this or any previous visit (from the past 24 hour(s)). Recent Results (from the past 240 hour(s))  SARS CORONAVIRUS 2 (TAT 6-24 HRS) Nasopharyngeal Nasopharyngeal Swab     Status: None   Collection Time: 04/15/20 11:03 AM   Specimen: Nasopharyngeal Swab  Result Value Ref Range Status   SARS Coronavirus 2 NEGATIVE NEGATIVE Final    Comment: (NOTE) SARS-CoV-2 target nucleic acids are NOT DETECTED. The SARS-CoV-2 RNA is generally detectable in upper and lower respiratory specimens during the acute phase of infection. Negative results do not preclude SARS-CoV-2 infection, do not rule out co-infections with other pathogens, and should not be used as the sole basis for treatment or other patient management decisions. Negative results must be combined with clinical observations, patient history, and epidemiological information. The expected result is Negative. Fact Sheet for Patients: SugarRoll.be Fact Sheet for Healthcare Providers: https://www.woods-mathews.com/ This test is not yet approved or cleared by the [place] FDA and  has been authorized for detection and/or diagnosis of SARS-CoV-2 by FDA under an Emergency Use Authorization (EUA). This EUA will remain  in effect (meaning this test can be used) for the duration of the COVID-19 declaration under Section 56 4(b)(1) of the Act, 21 U.S.C. section 360bbb-3(b)(1), unless the authorization is terminated or revoked sooner. Performed at [facility], [place] [address], [place], [place] [address]    Creatinine: No results for input(s): CREATININE in the  last 168 hours. Baseline Creatinine: unknown  Impression/Assessment:  58yo with bilateral renal calculi  Plan:  The risks/benefits/alternatves to bilateral ureteroscopic stone extraction was explained to the patient and she understands and wishes to proceed with surgery  Nicolette Bang 04/19/2020, 1:03 PM

## 2020-04-19 NOTE — Discharge Instructions (Signed)
Ureteral Stent Implantation, Care After This sheet gives you information about how to care for yourself after your procedure. Your health care provider may also give you more specific instructions. If you have problems or questions, contact your health care provider. What can I expect after the procedure? After the procedure, it is common to have:  Nausea.  Mild pain when you urinate. You may feel this pain in your lower back or lower abdomen. The pain should stop within a few minutes after you urinate. This may last for up to 1 week.  A small amount of blood in your urine for several days. Follow these instructions at home: Medicines  Take over-the-counter and prescription medicines only as told by your health care provider.  If you were prescribed an antibiotic medicine, take it as told by your health care provider. Do not stop taking the antibiotic even if you start to feel better.  Do not drive for 24 hours if you were given a sedative during your procedure.  Ask your health care provider if the medicine prescribed to you requires you to avoid driving or using heavy machinery. Activity  Rest as told by your health care provider.  Avoid sitting for a long time without moving. Get up to take short walks every 1-2 hours. This is important to improve blood flow and breathing. Ask for help if you feel weak or unsteady.  Return to your normal activities as told by your health care provider. Ask your health care provider what activities are safe for you. General instructions   Watch for any blood in your urine. Call your health care provider if the amount of blood in your urine increases.  If you have a catheter: ? Follow instructions from your health care provider about taking care of your catheter and collection bag. ? Do not take baths, swim, or use a hot tub until your health care provider approves. Ask your health care provider if you may take showers. You may only be allowed to  take sponge baths.  Drink enough fluid to keep your urine pale yellow.  Do not use any products that contain nicotine or tobacco, such as cigarettes, e-cigarettes, and chewing tobacco. These can delay healing after surgery. If you need help quitting, ask your health care provider.  Keep all follow-up visits as told by your health care provider. This is important. Contact a health care provider if:  You have pain that gets worse or does not get better with medicine, especially pain when you urinate.  You have difficulty urinating.  You feel nauseous or you vomit repeatedly during a period of more than 2 days after the procedure. Get help right away if:  Your urine is dark red or has blood clots in it.  You are leaking urine (have incontinence).  The end of the stent comes out of your urethra.  You cannot urinate.  You have sudden, sharp, or severe pain in your abdomen or lower back.  You have a fever.  You have swelling or pain in your legs.  You have difficulty breathing. Summary  After the procedure, it is common to have mild pain when you urinate that goes away within a few minutes after you urinate. This may last for up to 1 week.  Watch for any blood in your urine. Call your health care provider if the amount of blood in your urine increases.  Take over-the-counter and prescription medicines only as told by your health care provider.  Drink   enough fluid to keep your urine pale yellow. This information is not intended to replace advice given to you by your health care provider. Make sure you discuss any questions you have with your health care provider. Document Revised: 08/19/2018 Document Reviewed: 08/20/2018 Elsevier Patient Education  [place].  No advil aleve, motrin ibuprofen until 8  pm today  Post Anesthesia Home Care Instructions  Activity: Get plenty of rest for the remainder of the day. A responsible adult should stay with you for 24 hours  following the procedure.  For the next 24 hours, DO NOT: -Drive a car -Paediatric nurse -Drink alcoholic beverages -Take any medication unless instructed by your physician -Make any legal decisions or sign important papers.  Meals: Start with liquid foods such as gelatin or soup. Progress to regular foods as tolerated. Avoid greasy, spicy, heavy foods. If nausea and/or vomiting occur, drink only clear liquids until the nausea and/or vomiting subsides. Call your physician if vomiting continues.  Special Instructions/Symptoms: Your throat may feel dry or sore from the anesthesia or the breathing tube placed in your throat during surgery. If this causes discomfort, gargle with warm salt water. The discomfort should disappear within 24 hours.  If you had a scopolamine patch placed behind your ear for the management of post- operative nausea and/or vomiting:  1. The medication in the patch is effective for 72 hours, after which it should be removed.  Wrap patch in a tissue and discard in the trash. Wash hands thoroughly with soap and water. 2. You may remove the patch earlier than 72 hours if you experience unpleasant side effects which may include dry mouth, dizziness or visual disturbances. 3. Avoid touching the patch. Wash your hands with soap and water after contact with the patch.

## 2020-04-19 NOTE — Anesthesia Procedure Notes (Signed)
Procedure Name: LMA Insertion Date/Time: 04/19/2020 1:33 PM Performed by: Bonney Aid, CRNA Pre-anesthesia Checklist: Patient identified, Emergency Drugs available, Suction available and Patient being monitored Patient Re-evaluated:Patient Re-evaluated prior to induction Oxygen Delivery Method: Circle system utilized Preoxygenation: Pre-oxygenation with 100% oxygen Induction Type: IV induction Ventilation: Mask ventilation without difficulty LMA: LMA inserted LMA Size: 4.0 Number of attempts: 1 Airway Equipment and Method: Bite block Placement Confirmation: positive ETCO2 Tube secured with: Tape Dental Injury: Teeth and Oropharynx as per pre-operative assessment

## 2020-04-19 NOTE — H&P (Signed)
Urology Admission H&P  Chief Complaint: bilateral renal calculi  History of Present Illness: Latasha Hicks is a 59yo with a hx of nephrolithiasis who was found to have multiple bilateral renal calculi on renal US. She has intermittent bilateral flank pain which is sharp, intermittent mild to moderate. No LUTS. No fevers  Past Medical History:  Diagnosis Date  . Allergic rhinitis   . Complication of anesthesia    "hard to wake up in past"  . Endometriosis   . Hematuria    d/t kidney stones   . History of kidney stones   . History of sleep apnea    NO OSA SINCE WEIGHT LOSS SURGERY AND WEIGHT LOSS 2009  . Hydronephrosis   . Kidney stones   . Osteoarthritis   . Personal history of colonic polyps    Question accuracy, patient report only.  . Urinary incontinence   . VENOUS INSUFFICIENCY, CHRONIC 07/05/2007   Annotation: LE Qualifier: Diagnosis of  By: Dance CMA ([place]), Kim    . Vitamin D deficiency    Past Surgical History:  Procedure Laterality Date  . ABDOMINAL SURGERY    . ACHILLES TENDON SURGERY Left 12/2017  . BREAST CYST EXCISION Left   . COLONOSCOPY N/A 09/04/2013   Procedure: COLONOSCOPY;  Surgeon: Inda Castle, MD;  Location: [facility];  Service: Endoscopy;  Laterality: N/A;  . COLONOSCOPY W/ POLYPECTOMY    . CYSTOSCOPY WITH RETROGRADE PYELOGRAM, URETEROSCOPY AND STENT PLACEMENT Left 01/30/2019   Procedure: CYSTOSCOPY WITH RETROGRADE PYELOGRAM, URETEROSCOPY AND STENT PLACEMENT;  Surgeon: Cleon Gustin, MD;  Location: [facility];  Service: Urology;  Laterality: Left;  1 HR  . DILATION AND CURETTAGE OF UTERUS    . HIATAL HERNIA REPAIR    . HOLMIUM LASER APPLICATION Left [id number]   Procedure: HOLMIUM LASER APPLICATION;  Surgeon: Cleon Gustin, MD;  Location: [facility];  Service: Urology;  Laterality: Left;  . lap band replacement  2012  . LAPAROSCOPIC CHOLECYSTECTOMY    . LAPAROSCOPIC GASTRIC BANDING  2009  . TOTAL KNEE ARTHROPLASTY Left 09/29/2018   Procedure:  LEFT TOTAL KNEE ARTHROPLASTY;  Surgeon: Leandrew Koyanagi, MD;  Location: [facility];  Service: Orthopedics;  Laterality: Left;  Marland Kitchen VAGINAL HYSTERECTOMY     partial    Home Medications:  Current Facility-Administered Medications  Medication Dose Route Frequency Provider Last Rate Last Admin  . acetaminophen (TYLENOL) tablet 1,000 mg  1,000 mg Oral Once Suzette Battiest, MD      . ceFAZolin (ANCEF) IVPB 2g/100 mL premix  2 g Intravenous 30 min Pre-Op Alyson Ingles Candee Furbish, MD      . lactated ringers infusion   Intravenous Continuous Lyn Hollingshead, MD 50 mL/hr at 04/19/20 1148 New Bag at 04/19/20 1148   Allergies:  Allergies  Allergen Reactions  . Clarithromycin     REACTION: gi upset  . Tetanus Toxoids Other (See Comments)    Right arm large erythem reaction spontaneously improved    Family History  Problem Relation Age of Onset  . Diabetes Mother   . Cervical cancer Mother   . Hypertension Mother    Social History:  reports that she has never smoked. She has never used smokeless tobacco. She reports current alcohol use. She reports that she does not use drugs.  Review of Systems  Genitourinary: Positive for flank pain.  All other systems reviewed and are negative.   Physical Exam:  Vital signs in last 24 hours: Temp:  [98.7 F (37.1 C)] 98.7 F (37.1  C) (05/25 1145) Pulse Rate:  [65] 65 (05/25 1145) Resp:  [18] 18 (05/25 1145) BP: (131)/(79) 131/79 (05/25 1145) SpO2:  [99 %] 99 % (05/25 1145) Weight:  [107.5 kg] 107.5 kg (05/25 1145) Physical Exam  Constitutional: She is oriented to person, place, and time. She appears well-developed and well-nourished.  HENT:  Head: Normocephalic and atraumatic.  Eyes: Pupils are equal, round, and reactive to light. EOM are normal.  Neck: No thyromegaly present.  Cardiovascular: Normal rate and regular rhythm.  Respiratory: Effort normal. No respiratory distress.  GI: Soft. She exhibits no distension.  Musculoskeletal:         General: No edema. Normal range of motion.     Cervical back: Normal range of motion.  Neurological: She is alert and oriented to person, place, and time.  Skin: Skin is warm and dry.  Psychiatric: She has a normal mood and affect. Her behavior is normal. Judgment and thought content normal.    Laboratory Data:  No results found for this or any previous visit (from the past 24 hour(s)). Recent Results (from the past 240 hour(s))  SARS CORONAVIRUS 2 (TAT 6-24 HRS) Nasopharyngeal Nasopharyngeal Swab     Status: None   Collection Time: 04/15/20 11:03 AM   Specimen: Nasopharyngeal Swab  Result Value Ref Range Status   SARS Coronavirus 2 NEGATIVE NEGATIVE Final    Comment: (NOTE) SARS-CoV-2 target nucleic acids are NOT DETECTED. The SARS-CoV-2 RNA is generally detectable in upper and lower respiratory specimens during the acute phase of infection. Negative results do not preclude SARS-CoV-2 infection, do not rule out co-infections with other pathogens, and should not be used as the sole basis for treatment or other patient management decisions. Negative results must be combined with clinical observations, patient history, and epidemiological information. The expected result is Negative. Fact Sheet for Patients: SugarRoll.be Fact Sheet for Healthcare Providers: https://www.woods-mathews.com/ This test is not yet approved or cleared by the [place] FDA and  has been authorized for detection and/or diagnosis of SARS-CoV-2 by FDA under an Emergency Use Authorization (EUA). This EUA will remain  in effect (meaning this test can be used) for the duration of the COVID-19 declaration under Section 56 4(b)(1) of the Act, 21 U.S.C. section 360bbb-3(b)(1), unless the authorization is terminated or revoked sooner. Performed at [facility], [place] [address], [place], [place] [address]    Creatinine: No results for input(s): CREATININE in the  last 168 hours. Baseline Creatinine: unknown  Impression/Assessment:  59yo with bilateral renal calculi  Plan:  The risks/benefits/alternatves to bilateral ureteroscopic stone extraction was explained to the patient and she understands and wishes to proceed with surgery  Latasha Hicks 04/19/2020, 1:03 PM

## 2020-04-19 NOTE — Op Note (Signed)
Marland KitchenPreoperative diagnosis: bilateral renal calculi  Postoperative diagnosis: Same  Procedure: 1 cystoscopy 2. bilateralretrograde pyelography 3.  Intraoperative fluoroscopy, under one hour, with interpretation 4.  Bilateral ureteroscopic stone manipulation with basket extraction and laser lithotripsy 5.  bilateral 6 x 24 JJ stent placement  Attending: Rosie Fate  Anesthesia: General  Estimated blood loss: None  Drains: bilateral 6 x 24 JJ ureteral stent with tether  Specimens: stone for analysis  Antibiotics: ancef  Findings: bilateral mid and lower pole renal calculi. No hydronephrosis. No masses/lesions in the bladder. Ureteral orifices in normal anatomic location.  Indications: Patient is a 59 year old female with a history of bilateral renal calculi and bilateral flank pain. After discussing treatment options, they decided proceed with bilateral ureteroscopic stone manipulation.  Procedure her in detail: The patient was brought to the operating room and a brief timeout was done to ensure correct patient, correct procedure, correct site.  General anesthesia was administered patient was placed in dorsal lithotomy position.  Her genitalia was then prepped and draped in usual sterile fashion.  A rigid 70 French cystoscope was passed in the urethra and the bladder.  Bladder was inspected free masses or lesions.  the ureteral orifices were in the normal orthotopic locations. a 6 french ureteral catheter was then instilled into the left ureteral orifice.  a gentle retrograde was obtained and findings noted above. We then advanced a zipwire through the catheter and up to the renal pelvis.  we then removed the cystoscope and cannulated the left ureteral orifice with a semirigid ureteroscope.  We located no stone in the ureter. We then placed a sensor wire up to the renal pelvis. We removed the scope and advanced a 12/14 x 35cm access sheath up to the renal pelvis. We then used the flexible  ureteroscope to perform nephroscopy. We located calculi in the mid and lower poles which were removed with an NGage basket. Once the stone were removed we then removed the access sheath under direct vision and noted to injury to the ureter.  We then placed a 6 x 26 double-j ureteral stent over the original zip wire. We then removed the wire and good coil was noted in the the renal pelvis under fluoroscopy and the bladder under direct vision.  We then turned out attention to the right side. We then advanced a zipwire through the catheter and up to the renal pelvis.  we then removed the cystoscope and cannulated the left ureteral orifice with a semirigid ureteroscope.  We located no stone in the ureter. We then placed a sensor wire up to the renal pelvis. We removed the scope and advanced a 12/14 x 35cm access sheath up to the renal pelvis. We then used the flexible ureteroscope to perform nephroscopy. We located a calculus in the lower pole. Using a 200nm laser fiber the stone was fragmented and the fragments removed with an NGAge basket Once the stone were removed we then removed the access sheath under direct vision and noted to injury to the ureter. we then placed a 6 x 26 double-j ureteral stent over the original zip wire.  We then removed the wire and good coil was noted in the the renal pelvis under fluoroscopy and the bladder under direct vision.   the bladder was then drained and this concluded the procedure which was well tolerated by patient.  Complications: None  Condition: Stable, extubated, transferred to PACU  Plan: Patient is to be discharged home as to follow-up in 2 weeks. She  is to remove her stents by pulling the tehter in 72 hours

## 2020-04-19 NOTE — Transfer of Care (Signed)
Immediate Anesthesia Transfer of Care Note  Patient: Latasha Hicks  Procedure(s) Performed: CYSTOSCOPY WITH RETROGRADE PYELOGRAM, URETEROSCOPY AND STENT PLACEMENT (Bilateral Pelvis) HOLMIUM LASER APPLICATION (Bilateral Pelvis)  Patient Location: PACU  Anesthesia Type:General  Level of Consciousness: awake, alert  and oriented  Airway & Oxygen Therapy: Patient Spontanous Breathing and Patient connected to nasal cannula oxygen  Post-op Assessment: Report given to RN  Post vital signs: Reviewed and stable  Last Vitals:  Vitals Value Taken Time  BP 145/76 04/19/20 1446  Temp 36.7 C 04/19/20 1448  Pulse 64 04/19/20 1448  Resp 13 04/19/20 1447  SpO2 98 % 04/19/20 1448  Vitals shown include unvalidated device data.  Last Pain:  Vitals:   04/19/20 1145  TempSrc: Oral  PainSc: 3       Patients Stated Pain Goal: 4 ([id number])  Complications: No apparent anesthesia complications

## 2020-04-19 NOTE — Anesthesia Preprocedure Evaluation (Signed)
Anesthesia Evaluation  Patient identified by MRN, date of birth, ID band Patient awake    Reviewed: Allergy & Precautions, NPO status , Patient's Chart, lab work & pertinent test results  Airway Mallampati: II  TM Distance: >3 FB Neck ROM: Full    Dental  (+) Dental Advisory Given   Pulmonary sleep apnea ,    breath sounds clear to auscultation       Cardiovascular negative cardio ROS   Rhythm:Regular Rate:Normal     Neuro/Psych negative neurological ROS     GI/Hepatic Neg liver ROS, PUD, S/p gastric band   Endo/Other  negative endocrine ROS  Renal/GU Renal disease     Musculoskeletal  (+) Arthritis ,   Abdominal   Peds  Hematology negative hematology ROS (+)   Anesthesia Other Findings   Reproductive/Obstetrics                             Anesthesia Physical Anesthesia Plan  ASA: II  Anesthesia Plan: General   Post-op Pain Management:    Induction: Intravenous  PONV Risk Score and Plan: 3 and Dexamethasone, Ondansetron and Treatment may vary due to age or medical condition  Airway Management Planned: LMA and Oral ETT  Additional Equipment: None  Intra-op Plan:   Post-operative Plan: Extubation in OR  Informed Consent: I have reviewed the patients History and Physical, chart, labs and discussed the procedure including the risks, benefits and alternatives for the proposed anesthesia with the patient or authorized representative who has indicated his/her understanding and acceptance.     Dental advisory given  Plan Discussed with: CRNA  Anesthesia Plan Comments:         Anesthesia Quick Evaluation

## 2020-04-20 ENCOUNTER — Other Ambulatory Visit: Payer: Self-pay | Admitting: Urology

## 2020-04-20 NOTE — Anesthesia Postprocedure Evaluation (Signed)
Anesthesia Post Note  Patient: Latasha Hicks  Procedure(s) Performed: CYSTOSCOPY WITH RETROGRADE PYELOGRAM, URETEROSCOPY AND STENT PLACEMENT (Bilateral Pelvis) HOLMIUM LASER APPLICATION (Bilateral Pelvis)     Patient location during evaluation: PACU Anesthesia Type: General Level of consciousness: awake and alert Pain management: pain level controlled Vital Signs Assessment: post-procedure vital signs reviewed and stable Respiratory status: spontaneous breathing, nonlabored ventilation, respiratory function stable and patient connected to nasal cannula oxygen Cardiovascular status: blood pressure returned to baseline and stable Postop Assessment: no apparent nausea or vomiting Anesthetic complications: no    Last Vitals:  Vitals:   04/19/20 1615 04/19/20 1707  BP: 126/72 128/82  Pulse: (!) 54 (!) 56  Resp: 17 16  Temp:  36.4 C  SpO2: 93% 96%    Last Pain:  Vitals:   04/20/20 1627  TempSrc:   PainSc: 4                  [name]

## 2020-05-06 ENCOUNTER — Encounter (HOSPITAL_BASED_OUTPATIENT_CLINIC_OR_DEPARTMENT_OTHER): Payer: Self-pay | Admitting: Urology

## 2020-05-06 ENCOUNTER — Other Ambulatory Visit: Payer: Self-pay

## 2020-05-06 NOTE — Progress Notes (Addendum)
Spoke w/ via phone for pre-op interview---patient Lab needs dos----    none        COVID test ------05-13-2020@ 1035 Arrive at -------815 am 05-17-2020 NPO after ------midnight Medications to take morning of surgery -----none Diabetic medication -----n/a Patient Special Instructions -----none Pre-Op special Istructions -----none Patient verbalized understanding of instructions that were given at this phone interview. Patient denies shortness of breath, chest pain, fever, cough a this phone interview.

## 2020-05-13 ENCOUNTER — Other Ambulatory Visit (HOSPITAL_COMMUNITY)
Admission: RE | Admit: 2020-05-13 | Discharge: 2020-05-13 | Disposition: A | Discharge status: Home / Self Care | Payer: 59 | Source: Ambulatory Visit | Attending: Urology | Admitting: Urology

## 2020-05-13 DIAGNOSIS — Z20822 Contact with and (suspected) exposure to covid-19: Secondary | ICD-10-CM | POA: Diagnosis not present

## 2020-05-13 DIAGNOSIS — Z01812 Encounter for preprocedural laboratory examination: Secondary | ICD-10-CM | POA: Diagnosis present

## 2020-05-14 LAB — SARS CORONAVIRUS 2 (TAT 6-24 HRS): SARS Coronavirus 2: NEGATIVE

## 2020-05-17 ENCOUNTER — Ambulatory Visit (HOSPITAL_BASED_OUTPATIENT_CLINIC_OR_DEPARTMENT_OTHER): Payer: 59 | Admitting: Certified Registered"

## 2020-05-17 ENCOUNTER — Encounter (HOSPITAL_BASED_OUTPATIENT_CLINIC_OR_DEPARTMENT_OTHER): Payer: Self-pay | Admitting: Urology

## 2020-05-17 ENCOUNTER — Other Ambulatory Visit: Payer: Self-pay

## 2020-05-17 ENCOUNTER — Ambulatory Visit (HOSPITAL_BASED_OUTPATIENT_CLINIC_OR_DEPARTMENT_OTHER)
Admission: RE | Admit: 2020-05-17 | Discharge: 2020-05-17 | Disposition: A | Discharge status: Home / Self Care | Payer: 59 | Attending: Urology | Admitting: Urology

## 2020-05-17 ENCOUNTER — Encounter (HOSPITAL_BASED_OUTPATIENT_CLINIC_OR_DEPARTMENT_OTHER)
Admission: RE | Disposition: A | Discharge status: Home / Self Care | Payer: Self-pay | Source: Home / Self Care | Attending: Urology

## 2020-05-17 DIAGNOSIS — Z87442 Personal history of urinary calculi: Secondary | ICD-10-CM | POA: Insufficient documentation

## 2020-05-17 DIAGNOSIS — Z96652 Presence of left artificial knee joint: Secondary | ICD-10-CM | POA: Diagnosis not present

## 2020-05-17 DIAGNOSIS — Z9884 Bariatric surgery status: Secondary | ICD-10-CM | POA: Diagnosis not present

## 2020-05-17 DIAGNOSIS — M1990 Unspecified osteoarthritis, unspecified site: Secondary | ICD-10-CM | POA: Diagnosis not present

## 2020-05-17 DIAGNOSIS — Z466 Encounter for fitting and adjustment of urinary device: Secondary | ICD-10-CM | POA: Diagnosis present

## 2020-05-17 HISTORY — PX: CYSTOSCOPY WITH RETROGRADE PYELOGRAM, URETEROSCOPY AND STENT PLACEMENT: SHX5789

## 2020-05-17 SURGERY — CYSTOURETEROSCOPY, WITH RETROGRADE PYELOGRAM AND STENT INSERTION
Anesthesia: General

## 2020-05-17 MED ORDER — OXYCODONE HCL 5 MG PO TABS: 5.0000 mg | ORAL_TABLET | Freq: Once | ORAL | Status: DC | PRN

## 2020-05-17 MED ORDER — FENTANYL CITRATE (PF) 100 MCG/2ML IJ SOLN
INTRAMUSCULAR | Status: AC
  Filled 2020-05-17: qty 2

## 2020-05-17 MED ORDER — IOHEXOL 300 MG/ML  SOLN
INTRAMUSCULAR | Status: DC | PRN
  Administered 2020-05-17: 5 mL via URETHRAL

## 2020-05-17 MED ORDER — DEXAMETHASONE SODIUM PHOSPHATE 4 MG/ML IJ SOLN
INTRAMUSCULAR | Status: DC | PRN
  Administered 2020-05-17: 10 mg via INTRAVENOUS

## 2020-05-17 MED ORDER — CEFTRIAXONE SODIUM 2 G IJ SOLR
INTRAMUSCULAR | Status: AC
  Filled 2020-05-17: qty 20

## 2020-05-17 MED ORDER — LACTATED RINGERS IV SOLN: INTRAVENOUS | Status: DC

## 2020-05-17 MED ORDER — KETOROLAC TROMETHAMINE 30 MG/ML IJ SOLN
INTRAMUSCULAR | Status: AC
  Filled 2020-05-17: qty 1

## 2020-05-17 MED ORDER — SODIUM CHLORIDE 0.9 % IV SOLN
INTRAVENOUS | Status: AC
  Filled 2020-05-17: qty 100

## 2020-05-17 MED ORDER — ONDANSETRON HCL 4 MG/2ML IJ SOLN
INTRAMUSCULAR | Status: DC | PRN
  Administered 2020-05-17: 4 mg via INTRAVENOUS

## 2020-05-17 MED ORDER — PROPOFOL 10 MG/ML IV BOLUS
INTRAVENOUS | Status: DC | PRN
  Administered 2020-05-17: 160 mg via INTRAVENOUS

## 2020-05-17 MED ORDER — OXYCODONE HCL 5 MG/5ML PO SOLN: 5.0000 mg | Freq: Once | ORAL | Status: DC | PRN

## 2020-05-17 MED ORDER — LIDOCAINE 2% (20 MG/ML) 5 ML SYRINGE
INTRAMUSCULAR | Status: AC
  Filled 2020-05-17: qty 5

## 2020-05-17 MED ORDER — FENTANYL CITRATE (PF) 100 MCG/2ML IJ SOLN: 25.0000 ug | INTRAMUSCULAR | Status: DC | PRN

## 2020-05-17 MED ORDER — DEXAMETHASONE SODIUM PHOSPHATE 10 MG/ML IJ SOLN
INTRAMUSCULAR | Status: AC
  Filled 2020-05-17: qty 1

## 2020-05-17 MED ORDER — SODIUM CHLORIDE 0.9 % IV SOLN
2.0000 g | INTRAVENOUS | Status: AC
  Administered 2020-05-17: 2 g via INTRAVENOUS

## 2020-05-17 MED ORDER — MIDAZOLAM HCL 2 MG/2ML IJ SOLN
INTRAMUSCULAR | Status: AC
  Filled 2020-05-17: qty 2

## 2020-05-17 MED ORDER — LIDOCAINE HCL (CARDIAC) PF 100 MG/5ML IV SOSY
PREFILLED_SYRINGE | INTRAVENOUS | Status: DC | PRN
  Administered 2020-05-17: 100 mg via INTRAVENOUS

## 2020-05-17 MED ORDER — KETOROLAC TROMETHAMINE 30 MG/ML IJ SOLN
INTRAMUSCULAR | Status: DC | PRN
  Administered 2020-05-17: 30 mg via INTRAVENOUS

## 2020-05-17 MED ORDER — ONDANSETRON HCL 4 MG/2ML IJ SOLN: 4.0000 mg | Freq: Once | INTRAMUSCULAR | Status: DC | PRN

## 2020-05-17 MED ORDER — OXYCODONE-ACETAMINOPHEN 5-325 MG PO TABS: 1.0000 | ORAL_TABLET | ORAL | 0 refills | Status: AC | PRN

## 2020-05-17 MED ORDER — FENTANYL CITRATE (PF) 100 MCG/2ML IJ SOLN
INTRAMUSCULAR | Status: DC | PRN
  Administered 2020-05-17 (×2): 50 ug via INTRAVENOUS

## 2020-05-17 MED ORDER — ONDANSETRON HCL 4 MG/2ML IJ SOLN
INTRAMUSCULAR | Status: AC
  Filled 2020-05-17: qty 2

## 2020-05-17 MED ORDER — MIDAZOLAM HCL 5 MG/5ML IJ SOLN
INTRAMUSCULAR | Status: DC | PRN
  Administered 2020-05-17: 2 mg via INTRAVENOUS

## 2020-05-17 MED ORDER — PROPOFOL 10 MG/ML IV BOLUS
INTRAVENOUS | Status: AC
  Filled 2020-05-17: qty 40

## 2020-05-17 SURGICAL SUPPLY — 21 items
BAG DRAIN URO-CYSTO SKYTR STRL (DRAIN) ×4 IMPLANT
BAG DRN UROCATH (DRAIN) ×2
CATH INTERMIT  6FR 70CM (CATHETERS) ×3 IMPLANT
CLOTH BEACON ORANGE TIMEOUT ST (SAFETY) ×4 IMPLANT
GLOVE BIO SURGEON STRL SZ8 (GLOVE) ×4 IMPLANT
GLOVE BIOGEL PI IND STRL 7.5 (GLOVE) ×1 IMPLANT
GLOVE BIOGEL PI INDICATOR 7.5 (GLOVE) ×2
GLOVE ECLIPSE 7.5 STRL STRAW (GLOVE) ×3 IMPLANT
GOWN STRL REUS W/TWL LRG LVL3 (GOWN DISPOSABLE) ×3 IMPLANT
GOWN STRL REUS W/TWL XL LVL3 (GOWN DISPOSABLE) ×4 IMPLANT
GUIDEWIRE STR DUAL SENSOR (WIRE) ×3 IMPLANT
GUIDEWIRE ZIPWRE .038 STRAIGHT (WIRE) ×4 IMPLANT
IV NS IRRIG 3000ML ARTHROMATIC (IV SOLUTION) ×4 IMPLANT
KIT TURNOVER CYSTO (KITS) ×4 IMPLANT
MANIFOLD NEPTUNE II (INSTRUMENTS) ×4 IMPLANT
NS IRRIG 500ML POUR BTL (IV SOLUTION) ×4 IMPLANT
SYR 10ML LL (SYRINGE) ×4 IMPLANT
TRAY CYSTO PACK (CUSTOM PROCEDURE TRAY) ×4 IMPLANT
TUBE CONNECTING 12'X1/4 (SUCTIONS) ×1
TUBE CONNECTING 12X1/4 (SUCTIONS) ×2 IMPLANT
TUBING UROLOGY SET (TUBING) ×4 IMPLANT

## 2020-05-17 NOTE — Interval H&P Note (Signed)
History and Physical Interval Note:  05/17/2020 10:38 AM  Latasha Hicks  has presented today for surgery, with the diagnosis of RIGHT RENAL CALCULUS.  The various methods of treatment have been discussed with the patient and family. After consideration of risks, benefits and other options for treatment, the patient has consented to  Procedure(s) with comments: CYSTOSCOPY WITH RETROGRADE PYELOGRAM, URETEROSCOPY AND STENT PLACEMENT (Right) - 30 MINS HOLMIUM LASER APPLICATION (Right) as a surgical intervention.  The patient's history has been reviewed, patient examined, no change in status, stable for surgery.  I have reviewed the patient's chart and labs.  Questions were answered to the patient's satisfaction.     Nicolette Bang

## 2020-05-17 NOTE — Anesthesia Postprocedure Evaluation (Signed)
Anesthesia Post Note  Patient: Latasha Hicks  Procedure(s) Performed: CYSTOSCOPY WITH RIGHT RETROGRADE PYELOGRAM, INTRAOPERATIVE FLUOROSCOPY, under one hour, with INTERPRETATION, RIGHT DIAGNOSTIC URETEROSCOPY AND BILATERAL URETERAL STENT REMOVAL (N/A )     Patient location during evaluation: PACU Anesthesia Type: General Level of consciousness: awake and alert Pain management: pain level controlled Vital Signs Assessment: post-procedure vital signs reviewed and stable Respiratory status: spontaneous breathing, nonlabored ventilation and respiratory function stable Cardiovascular status: blood pressure returned to baseline and stable Postop Assessment: no apparent nausea or vomiting Anesthetic complications: no   No complications documented.  Last Vitals:  Vitals:   05/17/20 1215 05/17/20 1230  BP: 127/78 114/72  Pulse: (!) 48 (!) 46  Resp: 13 15  Temp:    SpO2: 95% 96%    Last Pain:  Vitals:   05/17/20 1328  TempSrc:   PainSc: 1                  Lidia Collum

## 2020-05-17 NOTE — Anesthesia Procedure Notes (Signed)
Procedure Name: LMA Insertion Date/Time: 05/17/2020 10:50 AM Performed by: Justice Rocher, CRNA Pre-anesthesia Checklist: Patient identified, Emergency Drugs available, Suction available, Patient being monitored and Timeout performed Patient Re-evaluated:Patient Re-evaluated prior to induction Oxygen Delivery Method: Circle system utilized Preoxygenation: Pre-oxygenation with 100% oxygen Induction Type: IV induction Ventilation: Mask ventilation without difficulty LMA: LMA inserted LMA Size: 4.0 Number of attempts: 1 Airway Equipment and Method: Bite block Placement Confirmation: positive ETCO2,  breath sounds checked- equal and bilateral and CO2 detector Tube secured with: Tape Dental Injury: Teeth and Oropharynx as per pre-operative assessment

## 2020-05-17 NOTE — Transfer of Care (Signed)
Immediate Anesthesia Transfer of Care Note  Patient: Latasha Hicks  Procedure(s) Performed: Procedure(s): CYSTOSCOPY WITH RIGHT RETROGRADE PYELOGRAM, URETEROSCOPY AND BILATERAL STENT REMOVAL  Patient Location: PACU  Anesthesia Type: General  Level of Consciousness: awake, sedated, patient cooperative and responds to stimulation  Airway & Oxygen Therapy: Patient Spontanous Breathing and Patient connected to [place] 02 and soft FM   Post-op Assessment: Report given to PACU RN, Post -op Vital signs reviewed and stable and Patient moving all extremities  Post vital signs: Reviewed and stable  Complications: No apparent anesthesia complications

## 2020-05-17 NOTE — Progress Notes (Signed)
Dr.Mckenzie in to speak with husband and patient.

## 2020-05-17 NOTE — Anesthesia Preprocedure Evaluation (Addendum)
Anesthesia Evaluation  Patient identified by MRN, date of birth, ID band Patient awake    Reviewed: Allergy & Precautions, NPO status , Patient's Chart, lab work & pertinent test results  History of Anesthesia Complications Negative for: history of anesthetic complications  Airway Mallampati: II  TM Distance: >3 FB Neck ROM: Full    Dental  (+) Teeth Intact   Pulmonary neg pulmonary ROS,    Pulmonary exam normal        Cardiovascular negative cardio ROS Normal cardiovascular exam     Neuro/Psych negative neurological ROS  negative psych ROS   GI/Hepatic Neg liver ROS, PUD, S/p gastric band   Endo/Other  negative endocrine ROS  Renal/GU Renal disease (renal calculi)  negative genitourinary   Musculoskeletal  (+) Arthritis ,   Abdominal   Peds  Hematology negative hematology ROS (+)   Anesthesia Other Findings   Reproductive/Obstetrics                            Anesthesia Physical Anesthesia Plan  ASA: II  Anesthesia Plan: General   Post-op Pain Management:    Induction: Intravenous  PONV Risk Score and Plan: 3 and Ondansetron, Dexamethasone, Midazolam and Treatment may vary due to age or medical condition  Airway Management Planned: LMA  Additional Equipment: None  Intra-op Plan:   Post-operative Plan: Extubation in OR  Informed Consent: I have reviewed the patients History and Physical, chart, labs and discussed the procedure including the risks, benefits and alternatives for the proposed anesthesia with the patient or authorized representative who has indicated his/her understanding and acceptance.     Dental advisory given  Plan Discussed with:   Anesthesia Plan Comments:         Anesthesia Quick Evaluation

## 2020-05-17 NOTE — Op Note (Signed)
Preoperative diagnosis: Right renal calculus  Postoperative diagnosis: no renal calculus visualized  Procedure: 1 cystoscopy 2.  right retrograde pyelography 3.  Intraoperative fluoroscopy, under one hour, with interpretation 4.  Right diagnostic ureteroscopy 5. bilateral ureteral stent removal  Attending: Rosie Fate  Anesthesia: General  Estimated blood loss: None  Drains: none  Specimens: none  Antibiotics: rocephin  Findings: No right hydronephrosis. No calculi visualized in the right kidney. Bilateral stent removed  Indications: Patient is a 59 year old female with a history of right renal calculi who underwent right ureteral stent placement 4 weeks ago.   After discussing treatment options, she decided proceed with right ureteroscopic stone extraction.  Procedure her in detail: The patient was brought to the operating room and a brief timeout was done to ensure correct patient, correct procedure, correct site.  General anesthesia was administered patient was placed in dorsal lithotomy position.  Her genitalia was then prepped and draped in usual sterile fashion.  A rigid 53 French cystoscope was passed in the urethra and the bladder.  Bladder was inspected free masses or lesions.  the ureteral orifices were in the normal orthotopic locations. Using a grasper the left ureteral stent was removed. Using a grasper the right ureteral stent was brought to the urethral meatus. A zipwire was advanced through the stent and up to the renal pelvis. The stent was then removed. a 6 french ureteral catheter was then instilled into the right ureter orifice.  a gentle retrograde was obtained and findings noted above. we then removed the cystoscope and cannulated the right ureteral orifice with a semirigid ureteroscope.  we then performed ureteroscopy up to the level of the UPJ. No stone or tumor was encountered. Once we reached the UPJ a sensor wire was advanced into the renal pelvis. We then  removed the scope and advanced a flexible ureteroscope over the wire and up to the renal pelvis. We then performed nephroscopy and noted no calculi. We then removed the scope and wire. We elected to not leave a stent since this was an uncomplicated ureteroscopy.  the bladder was then drained and this concluded the procedure which was well tolerated by patient.  Complications: None  Condition: Stable, extubated, transferred to PACU  Plan: Pt is to followup in 2 weeks

## 2020-05-17 NOTE — Discharge Instructions (Signed)
Ureteroscopy Ureteroscopy is a procedure to check for and treat problems inside part of the urinary tract. In this procedure, a thin, tube-shaped instrument with a light at the end (ureteroscope) is used to look at the inside of the kidneys and the ureters, which are the tubes that carry urine from the kidneys to the bladder. The ureteroscope is inserted into one or both of the ureters. You may need this procedure if you have frequent urinary tract infections (UTIs), blood in your urine, or a stone in one of your ureters. A ureteroscopy can be done to find the cause of urine blockage in a ureter and to evaluate other abnormalities inside the ureters or kidneys. If stones are found, they can be removed during the procedure. Polyps, abnormal tissue, and some types of tumors can also be removed or treated. The ureteroscope may also have a tool to remove tissue to be checked for disease under a microscope (biopsy). Tell a health care provider about:  Any allergies you have.  All medicines you are taking, including vitamins, herbs, eye drops, creams, and over-the-counter medicines.  Any problems you or family members have had with anesthetic medicines.  Any blood disorders you have.  Any surgeries you have had.  Any medical conditions you have.  Whether you are pregnant or may be pregnant. What are the risks? Generally, this is a safe procedure. However, problems may occur, including:  Bleeding.  Infection.  Allergic reactions to medicines.  Scarring that narrows the ureter (stricture).  Creating a hole in the ureter (perforation). What happens before the procedure? Staying hydrated Follow instructions from your health care provider about hydration, which may include:  Up to 2 hours before the procedure - you may continue to drink clear liquids, such as water, clear fruit juice, black coffee, and plain tea. Eating and drinking restrictions Follow instructions from your health care  provider about eating and drinking, which may include:  8 hours before the procedure - stop eating heavy meals or foods such as meat, fried foods, or fatty foods.  6 hours before the procedure - stop eating light meals or foods, such as toast or cereal.  6 hours before the procedure - stop drinking milk or drinks that contain milk.  2 hours before the procedure - stop drinking clear liquids. Medicines  Ask your health care provider about: ? Changing or stopping your regular medicines. This is especially important if you are taking diabetes medicines or blood thinners. ? Taking medicines such as aspirin and ibuprofen. These medicines can thin your blood. Do not take these medicines before your procedure if your health care provider instructs you not to.  You may be given antibiotic medicine to help prevent infection. General instructions  You may have a urine sample taken to check for infection.  Plan to have someone take you home from the hospital or clinic. What happens during the procedure?   To reduce your risk of infection: ? Your health care team will wash or sanitize their hands. ? Your skin will be washed with soap.  An IV tube will be inserted into one of your veins.  You will be given one of the following: ? A medicine to help you relax (sedative). ? A medicine to make you fall asleep (general anesthetic). ? A medicine that is injected into your spine to numb the area below and slightly above the injection site (spinal anesthetic).  To lower your risk of infection, you may be given an antibiotic medicine   by an injection or through the IV tube.  The opening from which you urinate (urethra) will be cleaned with a germ-killing solution.  The ureteroscope will be passed through your urethra into your bladder.  A salt-water solution will flow through the ureteroscope to fill your bladder. This will help the health care provider see the openings of your ureters more  clearly.  Then, the ureteroscope will be passed into your ureter. ? If a growth is found, a piece of it may be removed so it can be examined under a microscope (biopsy). ? If a stone is found, it may be removed through the ureteroscope, or the stone may be broken up using a laser, shock waves, or electrical energy. ? In some cases, if the ureter is too small, a tube may be inserted that keeps the ureter open (ureteral stent). The stent may be left in place for 1 or 2 weeks to keep the ureter open, and then the ureteroscopy procedure will be performed.  The scope will be removed, and your bladder will be emptied. The procedure may vary among health care providers and hospitals. What happens after the procedure?  Your blood pressure, heart rate, breathing rate, and blood oxygen level will be monitored until the medicines you were given have worn off.  You may be asked to urinate.  Donot drive for 24 hours if you were given a sedative. This information is not intended to replace advice given to you by your health care provider. Make sure you discuss any questions you have with your health care provider. Document Revised: 10/25/2017 Document Reviewed: 08/24/2016 Elsevier Patient Education  [facility] CARE INSTRUCTIONS  Activity: Rest for the remainder of the day.  Do not drive or operate equipment today.  You may resume normal activities in one to two days as instructed by your physician.   Meals: Drink plenty of liquids and eat light foods such as gelatin or soup this evening.  You may return to a normal meal plan tomorrow.  Return to Work: You may return to work in one to two days or as instructed by your physician.  Special Instructions / Symptoms: Call your physician if any of these symptoms occur:   -persistent or heavy bleeding  -bleeding which continues after first few urination  -large blood clots that are difficult to pass  -urine stream  diminishes or stops completely  -fever equal to or higher than 101 degrees Farenheit.  -cloudy urine with a strong, foul odor  -severe pain  Females should always wipe from front to back after elimination.  You may feel some burning pain when you urinate.  This should disappear with time.  Applying moist heat to the lower abdomen or a hot tub bath may help relieve the pain. \   No advil, aleve, motrin, ibuprofen until 515 pm today   Post Anesthesia Home Care Instructions  Activity: Get plenty of rest for the remainder of the day. A responsible adult should stay with you for 24 hours following the procedure.  For the next 24 hours, DO NOT: -Drive a car -Paediatric nurse -Drink alcoholic beverages -Take any medication unless instructed by your physician -Make any legal decisions or sign important papers.  Meals: Start with liquid foods such as gelatin or soup. Progress to regular foods as tolerated. Avoid greasy, spicy, heavy foods. If nausea and/or vomiting occur, drink only clear liquids until the nausea and/or vomiting subsides. Call your physician if vomiting  continues.  Special Instructions/Symptoms: Your throat may feel dry or sore from the anesthesia or the breathing tube placed in your throat during surgery. If this causes discomfort, gargle with warm salt water. The discomfort should disappear within 24 hours.  If you had a scopolamine patch placed behind your ear for the management of post- operative nausea and/or vomiting:  1. The medication in the patch is effective for 72 hours, after which it should be removed.  Wrap patch in a tissue and discard in the trash. Wash hands thoroughly with soap and water. 2. You may remove the patch earlier than 72 hours if you experience unpleasant side effects which may include dry mouth, dizziness or visual disturbances. 3. Avoid touching the patch. Wash your hands with soap and water after contact with the patch.

## 2020-05-17 NOTE — OR Nursing (Signed)
Bilateral ureteral stents were removed by Dr. Alyson Ingles

## 2020-05-18 ENCOUNTER — Encounter (HOSPITAL_BASED_OUTPATIENT_CLINIC_OR_DEPARTMENT_OTHER): Payer: Self-pay | Admitting: Urology

## 2020-09-26 ENCOUNTER — Other Ambulatory Visit: Payer: Self-pay | Admitting: Internal Medicine

## 2020-09-26 DIAGNOSIS — Z1231 Encounter for screening mammogram for malignant neoplasm of breast: Secondary | ICD-10-CM

## 2020-11-02 ENCOUNTER — Ambulatory Visit
Admission: RE | Admit: 2020-11-02 | Discharge: 2020-11-02 | Disposition: A | Discharge status: Home / Self Care | Payer: 59 | Source: Ambulatory Visit | Attending: Internal Medicine | Admitting: Internal Medicine

## 2020-11-02 ENCOUNTER — Other Ambulatory Visit: Payer: Self-pay

## 2020-11-02 DIAGNOSIS — Z1231 Encounter for screening mammogram for malignant neoplasm of breast: Secondary | ICD-10-CM

## 2021-05-23 ENCOUNTER — Ambulatory Visit (INDEPENDENT_AMBULATORY_CARE_PROVIDER_SITE_OTHER): Payer: 59 | Admitting: Internal Medicine

## 2021-05-23 ENCOUNTER — Encounter: Payer: Self-pay | Admitting: Internal Medicine

## 2021-05-23 ENCOUNTER — Other Ambulatory Visit: Payer: Self-pay

## 2021-05-23 VITALS — BP 120/82 | HR 81 | Temp 98.0°F | Ht 65.0 in | Wt 252.4 lb

## 2021-05-23 DIAGNOSIS — Z0000 Encounter for general adult medical examination without abnormal findings: Secondary | ICD-10-CM

## 2021-05-23 DIAGNOSIS — H00014 Hordeolum externum left upper eyelid: Secondary | ICD-10-CM | POA: Diagnosis not present

## 2021-05-23 DIAGNOSIS — N200 Calculus of kidney: Secondary | ICD-10-CM | POA: Insufficient documentation

## 2021-05-23 DIAGNOSIS — Z9884 Bariatric surgery status: Secondary | ICD-10-CM

## 2021-05-23 DIAGNOSIS — K635 Polyp of colon: Secondary | ICD-10-CM

## 2021-05-23 DIAGNOSIS — H00019 Hordeolum externum unspecified eye, unspecified eyelid: Secondary | ICD-10-CM | POA: Insufficient documentation

## 2021-05-23 MED ORDER — ERYTHROMYCIN 5 MG/GM OP OINT: 1.0000 "application " | TOPICAL_OINTMENT | Freq: Three times a day (TID) | OPHTHALMIC | 0 refills | Status: DC

## 2021-05-23 MED ORDER — CEPHALEXIN 500 MG PO CAPS: 500.0000 mg | ORAL_CAPSULE | Freq: Four times a day (QID) | ORAL | 1 refills | Status: DC

## 2021-05-23 NOTE — Progress Notes (Signed)
Subjective:  Patient ID: Latasha Hicks, female    DOB: 07-28-1961  Age: 60 y.o. MRN: [id number]  CC: Annual Exam (Pt concern about stye (L) eye)   HPI [name] presents for a well exam C/o a L eye stye x 2-4 wks The patient is complaining of weight gain  Outpatient Medications Prior to Visit  Medication Sig Dispense Refill   acetaminophen (TYLENOL) 500 MG tablet Take 1,000 mg by mouth every 6 (six) hours as needed for moderate pain.     allopurinol (ZYLOPRIM) 300 MG tablet Take 300 mg by mouth daily. Take 1 by mouth daily     b complex vitamins tablet Take 1 tablet by mouth daily. 100 tablet 3   Cholecalciferol (D3-50) 1.25 MG (50000 UT) capsule 1 po q 30 days (Patient not taking: Reported on 05/23/2021) 3 capsule 3   ondansetron (ZOFRAN ODT) 4 MG disintegrating tablet Take 1 tablet (4 mg total) by mouth every 8 (eight) hours as needed for nausea or vomiting. (Patient not taking: Reported on 05/23/2021) 30 tablet 0   No facility-administered medications prior to visit.    ROS: Review of Systems  Constitutional:  Negative for activity change, appetite change, chills, fatigue and unexpected weight change.  HENT:  Negative for congestion, mouth sores and sinus pressure.   Eyes:  Negative for visual disturbance.  Respiratory:  Negative for cough and chest tightness.   Gastrointestinal:  Negative for abdominal pain and nausea.  Genitourinary:  Negative for difficulty urinating, frequency and vaginal pain.  Musculoskeletal:  Negative for back pain and gait problem.  Skin:  Negative for pallor and rash.  Neurological:  Negative for dizziness, tremors, weakness, numbness and headaches.  Psychiatric/Behavioral:  Negative for confusion and sleep disturbance.    Objective:  BP 120/82 (BP Location: Left Arm)   Pulse 81   Temp 98 F (36.7 C) (Oral)   Ht 5\' 5"  (1.651 m)   Wt 252 lb 6.4 oz (114.5 kg)   SpO2 97%   BMI 42.00 kg/m   BP Readings from Last 3 Encounters:   05/23/21 120/82  05/17/20 118/77  04/19/20 128/82    Wt Readings from Last 3 Encounters:  05/23/21 252 lb 6.4 oz (114.5 kg)  05/17/20 239 lb 1.6 oz (108.5 kg)  04/19/20 237 lb (107.5 kg)    Physical Exam Constitutional:      General: She is not in acute distress.    Appearance: She is well-developed. She is obese.  HENT:     Head: Normocephalic.     Right Ear: External ear normal.     Left Ear: External ear normal.     Nose: Nose normal.  Eyes:     General:        Right eye: No discharge.        Left eye: No discharge.     Conjunctiva/sclera: Conjunctivae normal.     Pupils: Pupils are equal, round, and reactive to light.  Neck:     Thyroid: No thyromegaly.     Vascular: No JVD.     Trachea: No tracheal deviation.  Cardiovascular:     Rate and Rhythm: Normal rate and regular rhythm.     Heart sounds: Normal heart sounds.  Pulmonary:     Effort: No respiratory distress.     Breath sounds: No stridor. No wheezing.  Abdominal:     General: Bowel sounds are normal. There is no distension.     Palpations: Abdomen is soft. There is  no mass.     Tenderness: There is no abdominal tenderness. There is no guarding or rebound.  Musculoskeletal:        General: No tenderness.     Cervical back: Normal range of motion and neck supple. No rigidity.  Lymphadenopathy:     Cervical: No cervical adenopathy.  Skin:    Findings: No erythema or rash.  Neurological:     Mental Status: She is oriented to person, place, and time.     Cranial Nerves: No cranial nerve deficit.     Motor: No abnormal muscle tone.     Coordination: Coordination normal.     Deep Tendon Reflexes: Reflexes normal.  Psychiatric:        Behavior: Behavior normal.        Thought Content: Thought content normal.        Judgment: Judgment normal.   L eye stye  Lab Results  Component Value Date   WBC 5.5 01/27/2019   HGB 14.0 01/27/2019   HCT 45.6 01/27/2019   PLT 238 01/27/2019   GLUCOSE 101 (H)  01/27/2019   CHOL 243 (H) 08/29/2018   TRIG 114.0 08/29/2018   HDL 44.40 08/29/2018   LDLDIRECT 181.4 05/01/2013   LDLCALC 176 (H) 08/29/2018   ALT 40 09/19/2018   AST 41 09/19/2018   NA 136 01/27/2019   K 4.1 01/27/2019   CL 109 01/27/2019   CREATININE 0.67 01/27/2019   BUN 14 01/27/2019   CO2 21 (L) 01/27/2019   TSH 1.51 08/29/2018   INR 0.99 09/19/2018   HGBA1C 6.0 02/06/2008    MM 3D SCREEN BREAST BILATERAL  Result Date: 11/05/2020 CLINICAL DATA:  Screening. EXAM: DIGITAL SCREENING BILATERAL MAMMOGRAM WITH TOMO AND CAD COMPARISON:  Previous exam(s). ACR Breast Density Category b: There are scattered areas of fibroglandular density. FINDINGS: There are no findings suspicious for malignancy. Images were processed with CAD. IMPRESSION: No mammographic evidence of malignancy. A result letter of this screening mammogram will be mailed directly to the patient. RECOMMENDATION: Screening mammogram in one year. (Code:SM-B-01Y) BI-RADS CATEGORY  1: Negative. Electronically Signed   By: Margarette Canada M.D.   On: 11/05/2020 10:29    Assessment & Plan:      Follow-up: No follow-ups on file.  Walker Kehr, MD

## 2021-05-23 NOTE — Assessment & Plan Note (Signed)
H/O laparoscopic adjustable gastric banding 2009 Check B12, Vit D

## 2021-05-23 NOTE — Assessment & Plan Note (Signed)
L upper eyelid Keflex po Erythro oint

## 2021-05-23 NOTE — Assessment & Plan Note (Signed)

## 2021-05-23 NOTE — Assessment & Plan Note (Signed)
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

## 2021-05-23 NOTE — Assessment & Plan Note (Signed)
H/O laparoscopic adjustable gastric banding 2009

## 2021-05-23 NOTE — Assessment & Plan Note (Signed)
Ref to Dr Carlean Purl Colon 2014 w/polypectomy complicated by bleeding Colon is past due in 2017

## 2021-06-05 ENCOUNTER — Encounter: Payer: Self-pay | Admitting: Internal Medicine

## 2021-06-12 ENCOUNTER — Other Ambulatory Visit (INDEPENDENT_AMBULATORY_CARE_PROVIDER_SITE_OTHER): Payer: 59

## 2021-06-12 DIAGNOSIS — Z0000 Encounter for general adult medical examination without abnormal findings: Secondary | ICD-10-CM

## 2021-06-12 DIAGNOSIS — Z9884 Bariatric surgery status: Secondary | ICD-10-CM | POA: Diagnosis not present

## 2021-06-12 LAB — TSH: TSH: 3.81 u[IU]/mL (ref 0.35–5.50)

## 2021-06-12 LAB — CBC WITH DIFFERENTIAL/PLATELET
Basophils Absolute: 0 10*3/uL (ref 0.0–0.1)
Basophils Relative: 0.4 % (ref 0.0–3.0)
Eosinophils Absolute: 0.1 10*3/uL (ref 0.0–0.7)
Eosinophils Relative: 1.9 % (ref 0.0–5.0)
HCT: 45.1 % (ref 36.0–46.0)
Hemoglobin: 15.2 g/dL — ABNORMAL HIGH (ref 12.0–15.0)
Lymphocytes Relative: 27.1 % (ref 12.0–46.0)
Lymphs Abs: 1.8 10*3/uL (ref 0.7–4.0)
MCHC: 33.7 g/dL (ref 30.0–36.0)
MCV: 94.2 fl (ref 78.0–100.0)
Monocytes Absolute: 0.6 10*3/uL (ref 0.1–1.0)
Monocytes Relative: 9 % (ref 3.0–12.0)
Neutro Abs: 4.1 10*3/uL (ref 1.4–7.7)
Neutrophils Relative %: 61.6 % (ref 43.0–77.0)
Platelets: 252 10*3/uL (ref 150.0–400.0)
RBC: 4.78 Mil/uL (ref 3.87–5.11)
RDW: 14.8 % (ref 11.5–15.5)
WBC: 6.6 10*3/uL (ref 4.0–10.5)

## 2021-06-12 LAB — COMPREHENSIVE METABOLIC PANEL
ALT: 23 U/L (ref 0–35)
AST: 22 U/L (ref 0–37)
Albumin: 3.8 g/dL (ref 3.5–5.2)
Alkaline Phosphatase: 62 U/L (ref 39–117)
BUN: 20 mg/dL (ref 6–23)
CO2: 23 mEq/L (ref 19–32)
Calcium: 9 mg/dL (ref 8.4–10.5)
Chloride: 107 mEq/L (ref 96–112)
Creatinine, Ser: 0.88 mg/dL (ref 0.40–1.20)
GFR: 72.36 mL/min (ref >60.00)
Glucose, Bld: 100 mg/dL — ABNORMAL HIGH (ref 70–99)
Potassium: 4.3 mEq/L (ref 3.5–5.1)
Sodium: 140 mEq/L (ref 135–145)
Total Bilirubin: 0.4 mg/dL (ref 0.2–1.2)
Total Protein: 6.2 g/dL (ref 6.0–8.3)

## 2021-06-12 LAB — VITAMIN B12: Vitamin B-12: 213 pg/mL (ref 211–911)

## 2021-06-12 LAB — VITAMIN D 25 HYDROXY (VIT D DEFICIENCY, FRACTURES): VITD: 18.76 ng/mL — ABNORMAL LOW (ref 30.00–100.00)

## 2021-06-14 ENCOUNTER — Other Ambulatory Visit: Payer: Self-pay | Admitting: Internal Medicine

## 2021-06-14 MED ORDER — VITAMIN B-12 1000 MCG SL SUBL: 1.0000 | SUBLINGUAL_TABLET | Freq: Every day | SUBLINGUAL | 3 refills | Status: AC

## 2021-06-14 MED ORDER — VITAMIN D (ERGOCALCIFEROL) 1.25 MG (50000 UNIT) PO CAPS: 50000.0000 [IU] | ORAL_CAPSULE | ORAL | 0 refills | Status: DC

## 2021-06-14 MED ORDER — VITAMIN D3 50 MCG (2000 UT) PO CAPS: 2000.0000 [IU] | ORAL_CAPSULE | Freq: Every day | ORAL | 3 refills | Status: AC

## 2021-08-09 ENCOUNTER — Ambulatory Visit: Payer: 59 | Admitting: Internal Medicine

## 2021-08-09 ENCOUNTER — Encounter: Payer: Self-pay | Admitting: Internal Medicine

## 2021-09-26 DIAGNOSIS — U071 COVID-19: Secondary | ICD-10-CM

## 2021-09-26 HISTORY — DX: COVID-19: U07.1

## 2021-10-02 ENCOUNTER — Encounter: Payer: Self-pay | Admitting: Nurse Practitioner

## 2021-10-02 ENCOUNTER — Telehealth (INDEPENDENT_AMBULATORY_CARE_PROVIDER_SITE_OTHER): Payer: 59 | Admitting: Nurse Practitioner

## 2021-10-02 ENCOUNTER — Other Ambulatory Visit: Payer: Self-pay

## 2021-10-02 VITALS — Ht 65.0 in | Wt 250.0 lb

## 2021-10-02 DIAGNOSIS — U071 COVID-19: Secondary | ICD-10-CM | POA: Diagnosis not present

## 2021-10-02 DIAGNOSIS — R051 Acute cough: Secondary | ICD-10-CM | POA: Insufficient documentation

## 2021-10-02 MED ORDER — MOLNUPIRAVIR EUA 200MG CAPSULE: 4.0000 | ORAL_CAPSULE | Freq: Two times a day (BID) | ORAL | 0 refills | Status: AC

## 2021-10-02 MED ORDER — BENZONATATE 200 MG PO CAPS: 200.0000 mg | ORAL_CAPSULE | Freq: Two times a day (BID) | ORAL | 0 refills | Status: AC | PRN

## 2021-10-02 NOTE — Assessment & Plan Note (Signed)
Patient tested + for Covid. Given age and co morbidities will treat patient. After joint discussion decided to use molnupiravir. Discussed the medication is still EUA and discussed the S/E. Patient acknowledged and we continued with treatment plan.  Did discuss signs and symptoms of when to be seen urgently or emergently.  Patient and spouse understood.

## 2021-10-02 NOTE — Assessment & Plan Note (Signed)
Counter medications we will send in some Tessalon Perles for extra relief.  Continue to monitor

## 2021-10-02 NOTE — Progress Notes (Signed)
Patient ID: Latasha Hicks, female    DOB: 03-20-1961, 60 y.o.   MRN: [id number]  Virtual visit completed through [employer], a video enabled telemedicine application. Due to national recommendations of social distancing due to COVID-19, a virtual visit is felt to be most appropriate for this patient at this time. Reviewed limitations, risks, security and privacy concerns of performing a virtual visit and the availability of in person appointments. I also reviewed that there may be a patient responsible charge related to this service. The patient agreed to proceed.   Patient location: home Provider location: [place] at [facility], office Persons participating in this virtual visit: patient, provider, spouse   If any vitals were documented, they were collected by patient at home unless specified below.    Ht 5\' 5"  (1.651 m)   Wt 250 lb (113.4 kg)   BMI 41.60 kg/m    CC: Covid 19 Subjective:   HPI: HONI NAME is a 60 y.o. female presenting on 10/02/2021 for Covid Positive, Cough, Sore Throat, and Abdominal Pain    Symptoms started on 09/29/2021 Covid test was positive on Sunday Vaccinated against covid x2 plus one Has been taking Nyquil, Advil, pseudoephedrine with no relief. Sick contact was husband who tested positive a few days prior to patient taking it      Relevant past medical, surgical, family and social history reviewed and updated as indicated. Interim medical history since our last visit reviewed. Allergies and medications reviewed and updated. Outpatient Medications Prior to Visit  Medication Sig Dispense Refill   acetaminophen (TYLENOL) 500 MG tablet Take 1,000 mg by mouth every 6 (six) hours as needed for moderate pain.     allopurinol (ZYLOPRIM) 300 MG tablet Take 300 mg by mouth daily. Take 1 by mouth daily     b complex vitamins tablet Take 1 tablet by mouth daily. 100 tablet 3   cephALEXin (KEFLEX) 500 MG capsule Take 1 capsule (500 mg total) by  mouth 4 (four) times daily. 40 capsule 1   Cholecalciferol (VITAMIN D3) 50 MCG (2000 UT) capsule Take 1 capsule (2,000 Units total) by mouth daily. 100 capsule 3   Cyanocobalamin (VITAMIN B-12) 1000 MCG SUBL Place 1 tablet (1,000 mcg total) under the tongue daily. 100 tablet 3   erythromycin ophthalmic ointment Place 1 application into both eyes 3 (three) times daily. 3.5 g 0   Vitamin D, Ergocalciferol, (DRISDOL) 1.25 MG (50000 UNIT) CAPS capsule Take 1 capsule (50,000 Units total) by mouth every 7 (seven) days. 8 capsule 0   No facility-administered medications prior to visit.     Per HPI unless specifically indicated in ROS section below Review of Systems  Constitutional:  Positive for chills, fatigue and fever.  HENT:  Positive for sinus pressure, sinus pain, sneezing and sore throat.   Respiratory:  Positive for cough. Negative for shortness of breath.   Cardiovascular:  Negative for chest pain.  Gastrointestinal:  Positive for abdominal pain (nears ribs and hurts with coughing) and diarrhea. Negative for constipation and vomiting.  Musculoskeletal:  Positive for arthralgias and myalgias.  Neurological:  Negative for headaches.  Objective:  Ht 5\' 5"  (1.651 m)   Wt 250 lb (113.4 kg)   BMI 41.60 kg/m   Wt Readings from Last 3 Encounters:  10/02/21 250 lb (113.4 kg)  05/23/21 252 lb 6.4 oz (114.5 kg)  05/17/20 239 lb 1.6 oz (108.5 kg)       Physical exam: Gen: alert, NAD, not ill appearing  Pulm: speaks in complete sentences without increased work of breathing Psych: normal mood, normal thought content      Results for orders placed or performed in visit on 06/12/21  VITAMIN D 25 Hydroxy (Vit-D Deficiency, Fractures)  Result Value Ref Range   VITD 18.76 (L) 30.00 - 100.00 ng/mL  Vitamin B12  Result Value Ref Range   Vitamin B-12 213 211 - 911 pg/mL  Comprehensive metabolic panel  Result Value Ref Range   Sodium 140 135 - 145 mEq/L   Potassium 4.3 3.5 - 5.1 mEq/L    Chloride 107 96 - 112 mEq/L   CO2 23 19 - 32 mEq/L   Glucose, Bld 100 (H) 70 - 99 mg/dL   BUN 20 6 - 23 mg/dL   Creatinine, Ser 0.88 0.40 - 1.20 mg/dL   Total Bilirubin 0.4 0.2 - 1.2 mg/dL   Alkaline Phosphatase 62 39 - 117 U/L   AST 22 0 - 37 U/L   ALT 23 0 - 35 U/L   Total Protein 6.2 6.0 - 8.3 g/dL   Albumin 3.8 3.5 - 5.2 g/dL   GFR 72.36 >60.00 mL/min   Calcium 9.0 8.4 - 10.5 mg/dL  CBC with Differential/Platelet  Result Value Ref Range   WBC 6.6 4.0 - 10.5 K/uL   RBC 4.78 3.87 - 5.11 Mil/uL   Hemoglobin 15.2 (H) 12.0 - 15.0 g/dL   HCT 45.1 36.0 - 46.0 %   MCV 94.2 78.0 - 100.0 fl   MCHC 33.7 30.0 - 36.0 g/dL   RDW 14.8 11.5 - 15.5 %   Platelets 252.0 150.0 - 400.0 K/uL   Neutrophils Relative % 61.6 43.0 - 77.0 %   Lymphocytes Relative 27.1 12.0 - 46.0 %   Monocytes Relative 9.0 3.0 - 12.0 %   Eosinophils Relative 1.9 0.0 - 5.0 %   Basophils Relative 0.4 0.0 - 3.0 %   Neutro Abs 4.1 1.4 - 7.7 K/uL   Lymphs Abs 1.8 0.7 - 4.0 K/uL   Monocytes Absolute 0.6 0.1 - 1.0 K/uL   Eosinophils Absolute 0.1 0.0 - 0.7 K/uL   Basophils Absolute 0.0 0.0 - 0.1 K/uL  TSH  Result Value Ref Range   TSH 3.81 0.35 - 5.50 uIU/mL   Assessment & Plan:   Problem List Items Addressed This Visit       Other   COVID-19 - Primary    Patient tested + for Covid. Given age and co morbidities will treat patient. After joint discussion decided to use molnupiravir. Discussed the medication is still EUA and discussed the S/E. Patient acknowledged and we continued with treatment plan.  Did discuss signs and symptoms of when to be seen urgently or emergently.  Patient and spouse understood.      Relevant Medications   molnupiravir EUA (LAGEVRIO) 200 mg CAPS capsule   benzonatate (TESSALON) 200 MG capsule   Acute cough    Counter medications we will send in some Tessalon Perles for extra relief.  Continue to monitor        No orders of the defined types were placed in this encounter.  No orders  of the defined types were placed in this encounter.   I discussed the assessment and treatment plan with the patient. The patient was provided an opportunity to ask questions and all were answered. The patient agreed with the plan and demonstrated an understanding of the instructions. The patient was advised to call back or seek an in-person evaluation if the symptoms worsen  or if the condition fails to improve as anticipated.  Follow up plan: No follow-ups on file.  Romilda Garret, NP

## 2021-10-24 ENCOUNTER — Ambulatory Visit (INDEPENDENT_AMBULATORY_CARE_PROVIDER_SITE_OTHER): Payer: 59 | Admitting: Internal Medicine

## 2021-10-24 ENCOUNTER — Encounter: Payer: Self-pay | Admitting: Internal Medicine

## 2021-10-24 VITALS — BP 110/80 | HR 65 | Ht 66.0 in | Wt 258.1 lb

## 2021-10-24 DIAGNOSIS — Z86010 Personal history of colonic polyps: Secondary | ICD-10-CM | POA: Diagnosis not present

## 2021-10-24 DIAGNOSIS — Z8719 Personal history of other diseases of the digestive system: Secondary | ICD-10-CM | POA: Diagnosis not present

## 2021-10-24 DIAGNOSIS — Z860101 Personal history of adenomatous and serrated colon polyps: Secondary | ICD-10-CM

## 2021-10-24 NOTE — Patient Instructions (Signed)
You have been scheduled for a colonoscopy. Please follow written instructions given to you at your visit today.  Please pick up your prep supplies at the pharmacy within the next 1-3 days. If you use inhalers (even only as needed), please bring them with you on the day of your procedure.   I appreciate the opportunity to care for you. Carl Gessner, MD, FACG 

## 2021-10-24 NOTE — Progress Notes (Signed)
Latasha Hicks 60 y.o. [birth year] [id number]  Assessment & Plan:   Encounter Diagnoses  Name Primary?   Hx of adenomatous polyp of colon Yes   History of GI bleed - post-polypectomy 2014      Schedule colonoscopy.  We reviewed how we now have clips available and do more cold snare polypectomy techniques to reduce the risk of bleeding after colonoscopy.  I do not think there is really an acceptable alternative with respect to testing i.e. Hemoccults are not appropriate once somebody has had a 15 mm adenoma, based upon expert opinion and guidelines.  The risks and benefits as well as alternatives of endoscopic procedure(s) have been discussed and reviewed. All questions answered. The patient agrees to proceed.  CC: Plotnikov, Evie Lacks, MD   Subjective:   Chief Complaint: History of colon polyp, discuss colonoscopy  HPI The patient is a 60 year old woman who had a 15 mm adenoma removed from the cecum by hot snare in 2014 and then had to be treated with clips because of a post polypectomy bleed the next day.  She was recommended to have a repeat colonoscopy in 2017 but she has deferred that because of the episode of bleeding.  She wonders if there are alternatives.  She denies active GI complaints at this time.    Allergies  Allergen Reactions   Clarithromycin     REACTION: gi upset   Tetanus Toxoids Other (See Comments)    Right arm large erythem reaction spontaneously improved   Current Meds  Medication Sig   acetaminophen (TYLENOL) 500 MG tablet Take 1,000 mg by mouth every 6 (six) hours as needed for moderate pain.   allopurinol (ZYLOPRIM) 300 MG tablet Take 300 mg by mouth daily. Take 1 by mouth daily   b complex vitamins tablet Take 1 tablet by mouth daily.   Cholecalciferol (VITAMIN D3) 50 MCG (2000 UT) capsule Take 1 capsule (2,000 Units total) by mouth daily.   Cyanocobalamin (VITAMIN B-12) 1000 MCG SUBL Place 1 tablet (1,000 mcg total) under the tongue  daily.   Probiotic Product (PROBIOTIC-10 PO) Take by mouth.   Past Medical History:  Diagnosis Date   Allergic rhinitis    Complication of anesthesia    "hard to wake up in past"   Endometriosis    Hematuria    d/t kidney stones    History of kidney stones    History of sleep apnea    NO OSA SINCE WEIGHT LOSS SURGERY AND WEIGHT LOSS 2009   Hydronephrosis    Kidney stones    Osteoarthritis    Personal history of colonic polyps    Question accuracy, patient report only.   Urinary incontinence    VENOUS INSUFFICIENCY, CHRONIC 07/05/2007   Annotation: LE Qualifier: Diagnosis of  By: Dance CMA (AAMA), Kim     Vitamin D deficiency    Past Surgical History:  Procedure Laterality Date   ABDOMINAL SURGERY     ACHILLES TENDON SURGERY Left 12/2017   BREAST CYST EXCISION Left    COLONOSCOPY N/A 09/04/2013   Procedure: COLONOSCOPY;  Surgeon: Latasha Castle, MD;  Location: [place];  Service: Endoscopy;  Laterality: N/A;   COLONOSCOPY W/ POLYPECTOMY     CYSTOSCOPY WITH RETROGRADE PYELOGRAM, URETEROSCOPY AND STENT PLACEMENT Left 01/30/2019   Procedure: CYSTOSCOPY WITH RETROGRADE PYELOGRAM, URETEROSCOPY AND STENT PLACEMENT;  Surgeon: Latasha Gustin, MD;  Location: [facility];  Service: Urology;  Laterality: Left;  1 HR   CYSTOSCOPY WITH RETROGRADE PYELOGRAM, URETEROSCOPY  AND STENT PLACEMENT Bilateral 04/19/2020   Procedure: CYSTOSCOPY WITH RETROGRADE PYELOGRAM, URETEROSCOPY AND STENT PLACEMENT;  Surgeon: Latasha Gustin, MD;  Location: [facility];  Service: Urology;  Laterality: Bilateral;  2 HRS   CYSTOSCOPY WITH RETROGRADE PYELOGRAM, URETEROSCOPY AND STENT PLACEMENT N/A 05/17/2020   Procedure: CYSTOSCOPY WITH RIGHT RETROGRADE PYELOGRAM, INTRAOPERATIVE FLUOROSCOPY, under one hour, with INTERPRETATION, RIGHT DIAGNOSTIC URETEROSCOPY AND BILATERAL URETERAL STENT REMOVAL;  Surgeon: Latasha Gustin, MD;  Location: [facility];  Service: Urology;  Laterality:  N/A;  30 MINS   DILATION AND CURETTAGE OF UTERUS     HIATAL HERNIA REPAIR     HOLMIUM LASER APPLICATION Left 2/0/3559   Procedure: HOLMIUM LASER APPLICATION;  Surgeon: Latasha Gustin, MD;  Location: [facility];  Service: Urology;  Laterality: Left;   HOLMIUM LASER APPLICATION Bilateral 7/41/6384   Procedure: HOLMIUM LASER APPLICATION;  Surgeon: Latasha Gustin, MD;  Location: [facility];  Service: Urology;  Laterality: Bilateral;   lap band replacement  2012   LAPAROSCOPIC CHOLECYSTECTOMY     LAPAROSCOPIC GASTRIC BANDING  2009   TOTAL KNEE ARTHROPLASTY Left 09/29/2018   Procedure: LEFT TOTAL KNEE ARTHROPLASTY;  Surgeon: Latasha Koyanagi, MD;  Location: [place];  Service: Orthopedics;  Laterality: Left;   VAGINAL HYSTERECTOMY     partial   Social History   Social History Narrative   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   Review of Systems As per HPI otherwise negative  Objective:   Physical Exam BP 110/80   Pulse 65   Ht 5\' 6"  (1.676 m)   Wt 258 lb 2 oz (117.1 kg)   BMI 41.66 kg/m  WDWN obese ww NAD Lungs cta Cor NL S1S2 no rmg Abd soft, NT obese Appropriate mood and affect alert and oriented x3

## 2021-12-14 ENCOUNTER — Encounter: Payer: Self-pay | Admitting: Internal Medicine

## 2021-12-14 ENCOUNTER — Other Ambulatory Visit: Payer: Self-pay

## 2021-12-14 ENCOUNTER — Ambulatory Visit (AMBULATORY_SURGERY_CENTER): Payer: 59 | Admitting: Internal Medicine

## 2021-12-14 VITALS — BP 124/76 | HR 59 | Temp 98.6°F | Resp 11 | Ht 66.0 in | Wt 258.0 lb

## 2021-12-14 DIAGNOSIS — Z86010 Personal history of colonic polyps: Secondary | ICD-10-CM | POA: Diagnosis not present

## 2021-12-14 MED ORDER — SODIUM CHLORIDE 0.9 % IV SOLN: 500.0000 mL | Freq: Once | INTRAVENOUS | Status: DC

## 2021-12-14 NOTE — Op Note (Signed)
[facility] Patient Name: Latasha Hicks Procedure Date: 12/14/2021 11:26 AM MRN: [id number] Endoscopist: Gatha Mayer , MD Age: 61 Referring MD:  Date of Birth: 1961-03-13 Gender: Female Account #: [id number] Procedure:                Colonoscopy Indications:              Surveillance: Personal history of adenomatous                            polyps on last colonoscopy > 5 years ago, Last                            colonoscopy: 2014 Medicines:                Propofol per Anesthesia, Monitored Anesthesia Care Procedure:                Pre-Anesthesia Assessment:                           - Prior to the procedure, a History and Physical                            was performed, and patient medications and                            allergies were reviewed. The patient's tolerance of                            previous anesthesia was also reviewed. The risks                            and benefits of the procedure and the sedation                            options and risks were discussed with the patient.                            All questions were answered, and informed consent                            was obtained. Prior Anticoagulants: The patient has                            taken no previous anticoagulant or antiplatelet                            agents. ASA Grade Assessment: III - A patient with                            severe systemic disease. After reviewing the risks                            and benefits, the patient was deemed in  satisfactory condition to undergo the procedure.                           After obtaining informed consent, the colonoscope                            was passed under direct vision. Throughout the                            procedure, the patient's blood pressure, pulse, and                            oxygen saturations were monitored continuously. The                            Olympus  PCF-H190DL (#[id number]) Colonoscope was                            introduced through the anus and advanced to the the                            terminal ileum, with identification of the                            appendiceal orifice and IC valve. The colonoscopy                            was performed without difficulty. The patient                            tolerated the procedure well. The quality of the                            bowel preparation was good. The terminal ileum,                            ileocecal valve, appendiceal orifice, and rectum                            were photographed. The bowel preparation used was                            Miralax via split dose instruction. Scope In: 11:38:54 AM Scope Out: 11:52:26 AM Scope Withdrawal Time: 0 hours 10 minutes 44 seconds  Total Procedure Duration: 0 hours 13 minutes 32 seconds  Findings:                 The perianal and digital rectal examinations were                            normal.                           Multiple diverticula were found in the sigmoid  colon.                           The exam was otherwise without abnormality on                            direct and retroflexion views. Complications:            No immediate complications. Estimated Blood Loss:     Estimated blood loss: none. Impression:               - Diverticulosis in the sigmoid colon.                           - The examination was otherwise normal on direct                            and retroflexion views.                           - No specimens collected.                           - Personal history of colonic polyp 15 mm adenoma                            removed from cecumn 8786 - complicated by                            post-polypectomy hemorrhage. Recommendation:           - Patient has a contact number available for                            emergencies. The signs and symptoms of potential                             delayed complications were discussed with the                            patient. Return to normal activities tomorrow.                            Written discharge instructions were provided to the                            patient.                           - Resume previous diet.                           - Continue present medications.                           - Repeat colonoscopy in 5 years for surveillance -  per guidelines given prior advanced adenoma Gatha Mayer, MD 12/14/2021 12:07:18 PM This report has been signed electronically.

## 2021-12-14 NOTE — Patient Instructions (Addendum)
No polyps today!  Your next routine colonoscopy should be in 5 years - 2028 per the guidelines given the size and type of polyp you had in 2014.  I appreciate the opportunity to care for you. Gatha Mayer, MD, FACG YOU HAD AN ENDOSCOPIC PROCEDURE TODAY AT [facility]:   Refer to the procedure report that was given to you for any specific questions about what was found during the examination.  If the procedure report does not answer your questions, please call your gastroenterologist to clarify.  If you requested that your care partner not be given the details of your procedure findings, then the procedure report has been included in a sealed envelope for you to review at your convenience later.  YOU SHOULD EXPECT: Some feelings of bloating in the abdomen. Passage of more gas than usual.  Walking can help get rid of the air that was put into your GI tract during the procedure and reduce the bloating. If you had a lower endoscopy (such as a colonoscopy or flexible sigmoidoscopy) you may notice spotting of blood in your stool or on the toilet paper. If you underwent a bowel prep for your procedure, you may not have a normal bowel movement for a few days.  Please Note:  You might notice some irritation and congestion in your nose or some drainage.  This is from the oxygen used during your procedure.  There is no need for concern and it should clear up in a day or so.  SYMPTOMS TO REPORT IMMEDIATELY:  Following lower endoscopy (colonoscopy or flexible sigmoidoscopy):  Excessive amounts of blood in the stool  Significant tenderness or worsening of abdominal pains  Swelling of the abdomen that is new, acute  Fever of 100F or higher  For urgent or emergent issues, a gastroenterologist can be reached at any hour by calling [phone]. Do not use MyChart messaging for urgent concerns.    DIET:  We do recommend a small meal at first, but then you may proceed to your regular  diet.  Drink plenty of fluids but you should avoid alcoholic beverages for 24 hours.  ACTIVITY:  You should plan to take it easy for the rest of today and you should NOT DRIVE or use heavy machinery until tomorrow (because of the sedation medicines used during the test).    FOLLOW UP: Our staff will call the number listed on your records 48-72 hours following your procedure to check on you and address any questions or concerns that you may have regarding the information given to you following your procedure. If we do not reach you, we will leave a message.  We will attempt to reach you two times.  During this call, we will ask if you have developed any symptoms of COVID 19. If you develop any symptoms (ie: fever, flu-like symptoms, shortness of breath, cough etc.) before then, please call [phone].  If you test positive for Covid 19 in the 2 weeks post procedure, please call and report this information to [place].    If any biopsies were taken you will be contacted by phone or by letter within the next 1-3 weeks.  Please call us at [phone] if you have not heard about the biopsies in 3 weeks.    SIGNATURES/CONFIDENTIALITY: You and/or your care partner have signed paperwork which will be entered into your electronic medical record.  These signatures attest to the fact that that the information above on your After Visit  Summary has been reviewed and is understood.  Full responsibility of the confidentiality of this discharge information lies with you and/or your care-partner.

## 2021-12-14 NOTE — Progress Notes (Signed)
Report to PACU, RN, vss, BBS= Clear.  

## 2021-12-14 NOTE — Progress Notes (Signed)
[facility] History and Physical   Primary Care Physician:  Cassandria Anger, MD   Reason for Procedure:   Hx adenomatous colon polyp  Plan:    colonoscopy     HPI: Latasha Hicks is a 61 y.o. female who had a 15 mm adenoma removed from the cecum by hot snare in 2014 and then had to be treated with clips because of a post polypectomy bleed the next day.  She was recommended to have a repeat colonoscopy in 2017 but she has deferred that because of the episode of bleeding.  She wonders if there are alternatives.  She denies active GI complaints at this time.     Past Medical History:  Diagnosis Date   Allergic rhinitis    Complication of anesthesia    "hard to wake up in past"   COVID-19 09/2021   Endometriosis    Hematuria    d/t kidney stones    History of kidney stones    History of sleep apnea    NO OSA SINCE WEIGHT LOSS SURGERY AND WEIGHT LOSS 2009   Hydronephrosis    Kidney stones    Osteoarthritis    Personal history of colonic polyps    Question accuracy, patient report only.   Sleep apnea    Urinary incontinence    VENOUS INSUFFICIENCY, CHRONIC 07/05/2007   Annotation: LE Qualifier: Diagnosis of  By: Dance CMA (AAMA), Kim     Vitamin D deficiency     Past Surgical History:  Procedure Laterality Date   ABDOMINAL SURGERY     ACHILLES TENDON SURGERY Left 12/2017   BREAST CYST EXCISION Left    COLONOSCOPY N/A 09/04/2013   Procedure: COLONOSCOPY;  Surgeon: Inda Castle, MD;  Location: [place];  Service: Endoscopy;  Laterality: N/A;   COLONOSCOPY W/ POLYPECTOMY     CYSTOSCOPY WITH RETROGRADE PYELOGRAM, URETEROSCOPY AND STENT PLACEMENT Left 01/30/2019   Procedure: CYSTOSCOPY WITH RETROGRADE PYELOGRAM, URETEROSCOPY AND STENT PLACEMENT;  Surgeon: Cleon Gustin, MD;  Location[facility];  Service: Urology;  Laterality: Left;  1 HR   CYSTOSCOPY WITH RETROGRADE PYELOGRAM, URETEROSCOPY AND STENT PLACEMENT Bilateral 04/19/2020   Procedure:  CYSTOSCOPY WITH RETROGRADE PYELOGRAM, URETEROSCOPY AND STENT PLACEMENT;  Surgeon: Cleon Gustin, MD;  Location: [facility];  Service: Urology;  Laterality: Bilateral;  2 HRS   CYSTOSCOPY WITH RETROGRADE PYELOGRAM, URETEROSCOPY AND STENT PLACEMENT N/A 05/17/2020   Procedure: CYSTOSCOPY WITH RIGHT RETROGRADE PYELOGRAM, INTRAOPERATIVE FLUOROSCOPY, under one hour, with INTERPRETATION, RIGHT DIAGNOSTIC URETEROSCOPY AND BILATERAL URETERAL STENT REMOVAL;  Surgeon: Cleon Gustin, MD;  Location: [facility];  Service: Urology;  Laterality: N/A;  30 MINS   DILATION AND CURETTAGE OF UTERUS     HIATAL HERNIA REPAIR     HOLMIUM LASER APPLICATION Left 7/0/6237   Procedure: HOLMIUM LASER APPLICATION;  Surgeon: Cleon Gustin, MD;  Location: [facility];  Service: Urology;  Laterality: Left;   HOLMIUM LASER APPLICATION Bilateral 05/23/3150   Procedure: HOLMIUM LASER APPLICATION;  Surgeon: Cleon Gustin, MD;  Location: [facility];  Service: Urology;  Laterality: Bilateral;   lap band replacement  2012   LAPAROSCOPIC CHOLECYSTECTOMY     LAPAROSCOPIC GASTRIC BANDING  2009   TOTAL KNEE ARTHROPLASTY Left 09/29/2018   Procedure: LEFT TOTAL KNEE ARTHROPLASTY;  Surgeon: Leandrew Koyanagi, MD;  Location: [place];  Service: Orthopedics;  Laterality: Left;   VAGINAL HYSTERECTOMY     partial    Prior to Admission medications  Medication Sig Start Date End Date Taking? Authorizing Provider  allopurinol (ZYLOPRIM) 300 MG tablet Take 300 mg by mouth daily. Take 1 by mouth daily 01/19/21  Yes [provider]  b complex vitamins tablet Take 1 tablet by mouth daily. 06/10/15  Yes Plotnikov, Evie Lacks, MD  Cholecalciferol (VITAMIN D3) 50 MCG (2000 UT) capsule Take 1 capsule (2,000 Units total) by mouth daily. 06/14/21  Yes Plotnikov, Evie Lacks, MD  Cyanocobalamin (VITAMIN B-12) 1000 MCG SUBL Place 1 tablet (1,000 mcg total) under the tongue daily. 06/14/21  Yes  Plotnikov, Evie Lacks, MD  Probiotic Product (PROBIOTIC-10 PO) Take by mouth.   Yes [provider]  acetaminophen (TYLENOL) 500 MG tablet Take 1,000 mg by mouth every 6 (six) hours as needed for moderate pain.    [provider]    Current Outpatient Medications  Medication Sig Dispense Refill   allopurinol (ZYLOPRIM) 300 MG tablet Take 300 mg by mouth daily. Take 1 by mouth daily     b complex vitamins tablet Take 1 tablet by mouth daily. 100 tablet 3   Cholecalciferol (VITAMIN D3) 50 MCG (2000 UT) capsule Take 1 capsule (2,000 Units total) by mouth daily. 100 capsule 3   Cyanocobalamin (VITAMIN B-12) 1000 MCG SUBL Place 1 tablet (1,000 mcg total) under the tongue daily. 100 tablet 3   Probiotic Product (PROBIOTIC-10 PO) Take by mouth.     acetaminophen (TYLENOL) 500 MG tablet Take 1,000 mg by mouth every 6 (six) hours as needed for moderate pain.     Current Facility-Administered Medications  Medication Dose Route Frequency Provider Last Rate Last Admin   0.9 %  sodium chloride infusion  500 mL Intravenous Once Gatha Mayer, MD        Allergies as of 12/14/2021 - Review Complete 12/14/2021  Allergen Reaction Noted   Clarithromycin  08/13/2008   Tetanus toxoids Other (See Comments) 04/24/2017    Family History  Problem Relation Age of Onset   Diabetes Mother    Cervical cancer Mother    Hypertension Mother    Rectal cancer Neg Hx    Stomach cancer Neg Hx    Heart disease Neg Hx    Colon cancer Neg Hx     Social History   Socioeconomic History   Marital status: Married    Spouse name: Not on file   Number of children: Not on file   Years of education: Not on file   Highest education level: Not on file  Occupational History   Not on file  Tobacco Use   Smoking status: Never   Smokeless tobacco: Never  Vaping Use   Vaping Use: Never used  Substance and Sexual Activity   Alcohol use: Yes    Comment: rarely   Drug use: No   Sexual activity: Yes     Birth control/protection: Post-menopausal  Other Topics Concern   Not on file  Social History Narrative   Married 1 son    retired Scientist, research (medical)      Review of Systems:  All other review of systems negative except as mentioned in the HPI.  Physical Exam: Vital signs BP 128/76    Pulse 60    Temp 98.6 F (37 C) (Temporal)    Resp 15    Ht 5\' 6"  (1.676 m)    Wt 258 lb (117 kg)    SpO2 98%    BMI 41.64 kg/m   General:   Alert,  Well-developed, well-nourished, pleasant and cooperative  in NAD Lungs:  Clear throughout to auscultation.   Heart:  Regular rate and rhythm; no murmurs, clicks, rubs,  or gallops. Abdomen:  Soft, nontender and nondistended. Normal bowel sounds.   Neuro/Psych:  Alert and cooperative. Normal mood and affect. A and O x 3   @Anaisabel Pederson  Simonne Maffucci, MD, [facility] [phone] (pager) 12/14/2021 11:28 AM@

## 2021-12-18 ENCOUNTER — Telehealth: Payer: Self-pay

## 2021-12-18 NOTE — Telephone Encounter (Signed)
Follow up telephone call placed, VM obtained, message left. SChaplin, RN,BSN

## 2021-12-18 NOTE — Telephone Encounter (Signed)
Left message on follow up call. 

## 2022-05-24 ENCOUNTER — Telehealth: Payer: Self-pay | Admitting: *Deleted

## 2022-05-24 ENCOUNTER — Ambulatory Visit (INDEPENDENT_AMBULATORY_CARE_PROVIDER_SITE_OTHER): Payer: 59 | Admitting: Internal Medicine

## 2022-05-24 ENCOUNTER — Encounter: Payer: Self-pay | Admitting: Internal Medicine

## 2022-05-24 DIAGNOSIS — N200 Calculus of kidney: Secondary | ICD-10-CM | POA: Diagnosis not present

## 2022-05-24 DIAGNOSIS — M722 Plantar fascial fibromatosis: Secondary | ICD-10-CM

## 2022-05-24 DIAGNOSIS — M7989 Other specified soft tissue disorders: Secondary | ICD-10-CM

## 2022-05-24 DIAGNOSIS — R600 Localized edema: Secondary | ICD-10-CM | POA: Insufficient documentation

## 2022-05-24 DIAGNOSIS — K922 Gastrointestinal hemorrhage, unspecified: Secondary | ICD-10-CM

## 2022-05-24 DIAGNOSIS — K8010 Calculus of gallbladder with chronic cholecystitis without obstruction: Secondary | ICD-10-CM | POA: Diagnosis not present

## 2022-05-24 DIAGNOSIS — Z136 Encounter for screening for cardiovascular disorders: Secondary | ICD-10-CM

## 2022-05-24 DIAGNOSIS — Z0000 Encounter for general adult medical examination without abnormal findings: Secondary | ICD-10-CM

## 2022-05-24 DIAGNOSIS — E559 Vitamin D deficiency, unspecified: Secondary | ICD-10-CM

## 2022-05-24 LAB — CBC WITH DIFFERENTIAL/PLATELET
Basophils Absolute: 0 10*3/uL (ref 0.0–0.1)
Basophils Relative: 0.2 % (ref 0.0–3.0)
Eosinophils Absolute: 0.1 10*3/uL (ref 0.0–0.7)
Eosinophils Relative: 1.5 % (ref 0.0–5.0)
HCT: 45.3 % (ref 36.0–46.0)
Hemoglobin: 14.6 g/dL (ref 12.0–15.0)
Lymphocytes Relative: 22.2 % (ref 12.0–46.0)
Lymphs Abs: 1.7 10*3/uL (ref 0.7–4.0)
MCHC: 32.2 g/dL (ref 30.0–36.0)
MCV: 94.7 fl (ref 78.0–100.0)
Monocytes Absolute: 0.6 10*3/uL (ref 0.1–1.0)
Monocytes Relative: 8.4 % (ref 3.0–12.0)
Neutro Abs: 5.1 10*3/uL (ref 1.4–7.7)
Neutrophils Relative %: 67.7 % (ref 43.0–77.0)
Platelets: 246 10*3/uL (ref 150.0–400.0)
RBC: 4.78 Mil/uL (ref 3.87–5.11)
RDW: 15.2 % (ref 11.5–15.5)
WBC: 7.6 10*3/uL (ref 4.0–10.5)

## 2022-05-24 LAB — LIPID PANEL
Cholesterol: 234 mg/dL — ABNORMAL HIGH (ref 0–200)
HDL: 49.5 mg/dL (ref >39.00)
LDL Cholesterol: 162 mg/dL — ABNORMAL HIGH (ref 0–99)
NonHDL: 184.93
Total CHOL/HDL Ratio: 5
Triglycerides: 115 mg/dL (ref 0.0–149.0)
VLDL: 23 mg/dL (ref 0.0–40.0)

## 2022-05-24 LAB — TSH: TSH: 2.82 u[IU]/mL (ref 0.35–5.50)

## 2022-05-24 LAB — URINALYSIS
Bilirubin Urine: NEGATIVE
Hgb urine dipstick: NEGATIVE
Ketones, ur: NEGATIVE
Leukocytes,Ua: NEGATIVE
Nitrite: NEGATIVE
Specific Gravity, Urine: 1.025 (ref 1.000–1.030)
Total Protein, Urine: NEGATIVE
Urine Glucose: NEGATIVE
Urobilinogen, UA: 0.2 (ref 0.0–1.0)
pH: 6 (ref 5.0–8.0)

## 2022-05-24 LAB — COMPREHENSIVE METABOLIC PANEL
ALT: 36 U/L — ABNORMAL HIGH (ref 0–35)
AST: 32 U/L (ref 0–37)
Albumin: 3.8 g/dL (ref 3.5–5.2)
Alkaline Phosphatase: 67 U/L (ref 39–117)
BUN: 18 mg/dL (ref 6–23)
CO2: 25 mEq/L (ref 19–32)
Calcium: 9 mg/dL (ref 8.4–10.5)
Chloride: 107 mEq/L (ref 96–112)
Creatinine, Ser: 0.86 mg/dL (ref 0.40–1.20)
GFR: 73.89 mL/min (ref >60.00)
Glucose, Bld: 108 mg/dL — ABNORMAL HIGH (ref 70–99)
Potassium: 4.3 mEq/L (ref 3.5–5.1)
Sodium: 138 mEq/L (ref 135–145)
Total Bilirubin: 0.5 mg/dL (ref 0.2–1.2)
Total Protein: 6.6 g/dL (ref 6.0–8.3)

## 2022-05-24 LAB — VITAMIN B12: Vitamin B-12: 1075 pg/mL — ABNORMAL HIGH (ref 211–911)

## 2022-05-24 LAB — VITAMIN D 25 HYDROXY (VIT D DEFICIENCY, FRACTURES): VITD: 28.49 ng/mL — ABNORMAL LOW (ref 30.00–100.00)

## 2022-05-24 MED ORDER — ALLOPURINOL 100 MG PO TABS: 100.0000 mg | ORAL_TABLET | Freq: Every day | ORAL | 3 refills | Status: AC

## 2022-05-24 MED ORDER — POTASSIUM CITRATE ER 5 MEQ (540 MG) PO TBCR: 5.0000 meq | EXTENDED_RELEASE_TABLET | Freq: Three times a day (TID) | ORAL | 11 refills | Status: DC

## 2022-05-24 MED ORDER — FUROSEMIDE 20 MG PO TABS: 20.0000 mg | ORAL_TABLET | Freq: Every day | ORAL | 3 refills | Status: DC | PRN

## 2022-05-24 MED ORDER — WEGOVY 0.5 MG/0.5ML ~~LOC~~ SOAJ: 0.5000 mg | SUBCUTANEOUS | 3 refills | Status: DC

## 2022-05-24 MED ORDER — FAMOTIDINE 40 MG PO TABS: 40.0000 mg | ORAL_TABLET | Freq: Every day | ORAL | 3 refills | Status: DC

## 2022-05-24 NOTE — Assessment & Plan Note (Signed)
D - dimer US Doppler if needed Wt loss, Compr socks Furosemide prn Compression socks

## 2022-05-24 NOTE — Assessment & Plan Note (Addendum)
R heel Blue-Emu cream use 2-3 times a day Heel lift 1/4 inch

## 2022-05-24 NOTE — Assessment & Plan Note (Addendum)
Worse Options discussed Start Wegovy if covered

## 2022-05-24 NOTE — Assessment & Plan Note (Signed)
On Vit D 

## 2022-05-24 NOTE — Progress Notes (Signed)
Subjective:  Patient ID: Latasha Hicks, female    DOB: 11-08-1961  Age: 61 y.o. MRN: [id number]  CC: Annual Exam (Swelling in left leg, X 2 months, has been elevating with short improvements, some burring on outer side of let foot but no swelling)   HPI Liz I Puthoff presents for a well exam C/o swelling in left leg, X 2 months, has been elevating with short improvements, some burring on outer side of let foot but no swelling C/o nausea and spitting up brown on Allopurinol - stopped  Outpatient Medications Prior to Visit  Medication Sig Dispense Refill   b complex vitamins tablet Take 1 tablet by mouth daily. 100 tablet 3   Cholecalciferol (VITAMIN D3) 50 MCG (2000 UT) capsule Take 1 capsule (2,000 Units total) by mouth daily. 100 capsule 3   Cyanocobalamin (VITAMIN B-12) 1000 MCG SUBL Place 1 tablet (1,000 mcg total) under the tongue daily. 100 tablet 3   Probiotic Product (PROBIOTIC-10 PO) Take by mouth.     acetaminophen (TYLENOL) 500 MG tablet Take 1,000 mg by mouth every 6 (six) hours as needed for moderate pain. (Patient not taking: Reported on 05/24/2022)     allopurinol (ZYLOPRIM) 300 MG tablet Take 300 mg by mouth daily. Take 1 by mouth daily (Patient not taking: Reported on 05/24/2022)     No facility-administered medications prior to visit.    ROS: Review of Systems  Constitutional:  Positive for fatigue and unexpected weight change. Negative for activity change, appetite change and chills.  HENT:  Negative for congestion, mouth sores and sinus pressure.   Eyes:  Negative for visual disturbance.  Respiratory:  Negative for cough and chest tightness.   Cardiovascular:  Positive for leg swelling.  Gastrointestinal:  Negative for abdominal pain and nausea.  Genitourinary:  Negative for difficulty urinating, frequency and vaginal pain.  Musculoskeletal:  Positive for arthralgias and gait problem. Negative for back pain.  Skin:  Positive for color change. Negative  for pallor and rash.  Neurological:  Negative for dizziness, tremors, weakness, numbness and headaches.  Psychiatric/Behavioral:  Negative for confusion and sleep disturbance. The patient is not nervous/anxious.     Objective:  BP 138/90 (BP Location: Left Arm, Patient Position: Sitting, Cuff Size: Large)   Pulse 66   Temp 98.3 F (36.8 C)   Resp 20   Ht '5\' 5"'$  (1.651 m)   Wt 261 lb 6.4 oz (118.6 kg)   SpO2 98%   BMI 43.50 kg/m   BP Readings from Last 3 Encounters:  05/24/22 138/90  12/14/21 124/76  10/24/21 110/80    Wt Readings from Last 3 Encounters:  05/24/22 261 lb 6.4 oz (118.6 kg)  12/14/21 258 lb (117 kg)  10/24/21 258 lb 2 oz (117.1 kg)    Physical Exam Constitutional:      General: She is not in acute distress.    Appearance: She is well-developed. She is obese.  HENT:     Head: Normocephalic.     Right Ear: External ear normal.     Left Ear: External ear normal.     Nose: Nose normal.  Eyes:     General:        Right eye: No discharge.        Left eye: No discharge.     Conjunctiva/sclera: Conjunctivae normal.     Pupils: Pupils are equal, round, and reactive to light.  Neck:     Thyroid: No thyromegaly.     Vascular: No  JVD.     Trachea: No tracheal deviation.  Cardiovascular:     Rate and Rhythm: Normal rate and regular rhythm.     Heart sounds: Normal heart sounds.  Pulmonary:     Effort: No respiratory distress.     Breath sounds: No stridor. No wheezing.  Abdominal:     General: Bowel sounds are normal. There is no distension.     Palpations: Abdomen is soft. There is no mass.     Tenderness: There is no abdominal tenderness. There is no guarding or rebound.  Musculoskeletal:        General: Swelling and tenderness present. No signs of injury.     Cervical back: Normal range of motion and neck supple. No rigidity.     Right lower leg: Edema present.     Left lower leg: Edema present.  Lymphadenopathy:     Cervical: No cervical adenopathy.   Skin:    Findings: No erythema or rash.  Neurological:     Mental Status: She is oriented to person, place, and time.     Cranial Nerves: No cranial nerve deficit.     Motor: No abnormal muscle tone.     Coordination: Coordination normal.     Deep Tendon Reflexes: Reflexes normal.  Psychiatric:        Behavior: Behavior normal.        Thought Content: Thought content normal.        Judgment: Judgment normal.    R Achilles - tender, R heel - tender LE hyperpigm and edema L>>R, NT Calves NT  I spent 22 minutes in addition to time for CPX wellness examination in preparing to see the patient by review of recent labs, imaging and procedures, obtaining and reviewing separately obtained history, communicating with the patient, ordering medications, tests or procedures, and documenting clinical information in the EHR including the differential diagnosis, treatment, and any further evaluation and other management of renal stones, leg swelling, wt gain         Lab Results  Component Value Date   WBC 6.6 06/12/2021   HGB 15.2 (H) 06/12/2021   HCT 45.1 06/12/2021   PLT 252.0 06/12/2021   GLUCOSE 100 (H) 06/12/2021   CHOL 243 (H) 08/29/2018   TRIG 114.0 08/29/2018   HDL 44.40 08/29/2018   LDLDIRECT 181.4 05/01/2013   LDLCALC 176 (H) 08/29/2018   ALT 23 06/12/2021   AST 22 06/12/2021   NA 140 06/12/2021   K 4.3 06/12/2021   CL 107 06/12/2021   CREATININE 0.88 06/12/2021   BUN 20 06/12/2021   CO2 23 06/12/2021   TSH 3.81 06/12/2021   INR 0.99 09/19/2018   HGBA1C 6.0 02/06/2008    MM 3D SCREEN BREAST BILATERAL  Result Date: 11/05/2020 CLINICAL DATA:  Screening. EXAM: DIGITAL SCREENING BILATERAL MAMMOGRAM WITH TOMO AND CAD COMPARISON:  Previous exam(s). ACR Breast Density Category b: There are scattered areas of fibroglandular density. FINDINGS: There are no findings suspicious for malignancy. Images were processed with CAD. IMPRESSION: No mammographic evidence of malignancy. A  result letter of this screening mammogram will be mailed directly to the patient. RECOMMENDATION: Screening mammogram in one year. (Code:SM-B-01Y) BI-RADS CATEGORY  1: Negative. Electronically Signed   By: Margarette Canada M.D.   On: 11/05/2020 10:29    Assessment & Plan:   Problem List Items Addressed This Visit     Calculus of gallbladder    S/p lap chole       GI bleed  Left leg swelling    Worse - likely venous insufficiency D dimer; Doppler US      Leg edema    D - dimer US Doppler if needed Wt loss, Compr socks Furosemide prn Compression socks      Relevant Orders   Vitamin B12   VITAMIN D 25 Hydroxy (Vit-D Deficiency, Fractures)   D-dimer, quantitative   Nephrolithiasis    Recurrent -  Allopurinol stopped due to side effects and potassium citrate - stopped due to large pill size Re-start      Relevant Medications   allopurinol (ZYLOPRIM) 100 MG tablet   potassium citrate (UROCIT-K) 5 MEQ (540 MG) SR tablet   OBESITY, MORBID    Worse Options discussed Start Wegovy if covered      Relevant Medications   Semaglutide-Weight Management (WEGOVY) 0.5 MG/0.5ML SOAJ   Plantar fasciitis of right foot    R heel Blue-Emu cream use 2-3 times a day Heel lift 1/4 inch      Vitamin D deficiency    On Vit D      Well adult exam    We discussed age appropriate health related issues, including available/recomended screening tests and vaccinations. Labs were ordered to be later reviewed . All questions were answered. We discussed one or more of the following - seat belt use, use of sunscreen/sun exposure exercise, fall risk reduction, second hand smoke exposure, firearm use and storage, seat belt use, a need for adhering to healthy diet and exercise. Labs were ordered.  All questions were answered. Colon 2014, 2023  - Dr Carlean Purl      Relevant Orders   TSH   Urinalysis   CBC with Differential/Platelet   Lipid panel   Comprehensive metabolic panel   Vitamin Y69    VITAMIN D 25 Hydroxy (Vit-D Deficiency, Fractures)   D-dimer, quantitative      Meds ordered this encounter  Medications   famotidine (PEPCID) 40 MG tablet    Sig: Take 1 tablet (40 mg total) by mouth daily.    Dispense:  90 tablet    Refill:  3   allopurinol (ZYLOPRIM) 100 MG tablet    Sig: Take 1 tablet (100 mg total) by mouth daily.    Dispense:  90 tablet    Refill:  3   Semaglutide-Weight Management (WEGOVY) 0.5 MG/0.5ML SOAJ    Sig: Inject 0.5 mg into the skin once a week.    Dispense:  2 mL    Refill:  3    BMI 43   potassium citrate (UROCIT-K) 5 MEQ (540 MG) SR tablet    Sig: Take 1 tablet (5 mEq total) by mouth 3 (three) times daily with meals.    Dispense:  90 tablet    Refill:  11   furosemide (LASIX) 20 MG tablet    Sig: Take 1 tablet (20 mg total) by mouth daily as needed.    Dispense:  30 tablet    Refill:  3      Follow-up: Return in about 6 weeks (around 07/05/2022) for a follow-up visit.  Walker Kehr, MD

## 2022-05-24 NOTE — Telephone Encounter (Signed)
Pt was on cover-my-meds need PA on Wegovy. Submitted PA w/ (Key: [id number]). Rec'd msg Your information has been sent to [facility]...Johny Chess

## 2022-05-24 NOTE — Assessment & Plan Note (Addendum)
Worse - likely venous insufficiency D dimer; Doppler US

## 2022-05-24 NOTE — Telephone Encounter (Signed)
Rec'd determination fax mwed was APPROVED. It states This request has received a Favorable outcome. Effective 05/24/22 through Reference Number: [id number]. WEGOVY INJ 0.'5MG'$  is approved through 09/23/2022. Faxed approval to pharmacy../lm,b

## 2022-05-24 NOTE — Assessment & Plan Note (Addendum)
We discussed age appropriate health related issues, including available/recomended screening tests and vaccinations. Labs were ordered to be later reviewed . All questions were answered. We discussed one or more of the following - seat belt use, use of sunscreen/sun exposure exercise, fall risk reduction, second hand smoke exposure, firearm use and storage, seat belt use, a need for adhering to healthy diet and exercise. Labs were ordered.  All questions were answered. Colon 2014, 2023  - Dr Carlean Purl

## 2022-05-24 NOTE — Assessment & Plan Note (Addendum)
Recurrent -  Allopurinol stopped due to side effects and potassium citrate - stopped due to large pill size Re-start

## 2022-05-24 NOTE — Patient Instructions (Addendum)
Blue-Emu cream use 2-3 times a day Heel lift 1/4 inch

## 2022-05-24 NOTE — Assessment & Plan Note (Signed)
S/p lap chole

## 2022-05-25 LAB — D-DIMER, QUANTITATIVE: D-Dimer, Quant: 0.67 mcg/mL FEU — ABNORMAL HIGH (ref <0.50)

## 2022-05-28 ENCOUNTER — Ambulatory Visit (HOSPITAL_COMMUNITY)
Admission: RE | Admit: 2022-05-28 | Discharge: 2022-05-28 | Disposition: A | Discharge status: Home / Self Care | Payer: 59 | Source: Ambulatory Visit | Attending: Internal Medicine | Admitting: Internal Medicine

## 2022-05-28 ENCOUNTER — Other Ambulatory Visit: Payer: Self-pay | Admitting: Internal Medicine

## 2022-05-28 DIAGNOSIS — R609 Edema, unspecified: Secondary | ICD-10-CM | POA: Diagnosis present

## 2022-05-28 NOTE — Progress Notes (Signed)
Bilateral LE venous duplex study completed. Please see CV Proc for preliminary results.  Anderson Malta  Kandie Keiper BS, RVT 05/28/2022 3:43 PM

## 2022-07-05 ENCOUNTER — Ambulatory Visit (INDEPENDENT_AMBULATORY_CARE_PROVIDER_SITE_OTHER): Payer: 59 | Admitting: Internal Medicine

## 2022-07-05 ENCOUNTER — Encounter: Payer: Self-pay | Admitting: Internal Medicine

## 2022-07-05 DIAGNOSIS — E559 Vitamin D deficiency, unspecified: Secondary | ICD-10-CM | POA: Diagnosis not present

## 2022-07-05 DIAGNOSIS — R600 Localized edema: Secondary | ICD-10-CM | POA: Diagnosis not present

## 2022-07-05 MED ORDER — WEGOVY 1 MG/0.5ML ~~LOC~~ SOAJ: 1.0000 mg | SUBCUTANEOUS | 3 refills | Status: DC

## 2022-07-05 NOTE — Progress Notes (Signed)
Subjective:  Patient ID: Latasha Hicks, female    DOB: Sep 15, 1961  Age: 61 y.o. MRN: [id number]  CC: 6 week f/u (Left leg swelling is the still the same. She is keeping her legs elevated. Evening is when she notices the swelling the most)   HPI [name] presents for obesitty, LE swelling.  The patient is here with her husband.  [name] is covered, however, it has not been available at lower doses.  Outpatient Medications Prior to Visit  Medication Sig Dispense Refill   allopurinol (ZYLOPRIM) 100 MG tablet Take 1 tablet (100 mg total) by mouth daily. 90 tablet 3   b complex vitamins tablet Take 1 tablet by mouth daily. 100 tablet 3   Cholecalciferol (VITAMIN D3) 50 MCG (2000 UT) capsule Take 1 capsule (2,000 Units total) by mouth daily. 100 capsule 3   Cyanocobalamin (VITAMIN B-12) 1000 MCG SUBL Place 1 tablet (1,000 mcg total) under the tongue daily. 100 tablet 3   famotidine (PEPCID) 40 MG tablet Take 1 tablet (40 mg total) by mouth daily. 90 tablet 3   furosemide (LASIX) 20 MG tablet Take 1 tablet (20 mg total) by mouth daily as needed. 30 tablet 3   potassium citrate (UROCIT-K) 5 MEQ (540 MG) SR tablet Take 1 tablet (5 mEq total) by mouth 3 (three) times daily with meals. 90 tablet 11   Probiotic Product (PROBIOTIC-10 PO) Take by mouth.     Semaglutide-Weight Management (WEGOVY) 0.5 MG/0.5ML SOAJ Inject 0.5 mg into the skin once a week. (Patient not taking: Reported on 07/05/2022) 2 mL 3   No facility-administered medications prior to visit.    ROS: Review of Systems  Constitutional:  Negative for activity change, appetite change, chills, fatigue and unexpected weight change.  HENT:  Negative for congestion, mouth sores and sinus pressure.   Eyes:  Negative for visual disturbance.  Respiratory:  Negative for cough and chest tightness.   Gastrointestinal:  Negative for abdominal pain and nausea.  Genitourinary:  Negative for difficulty urinating, frequency and vaginal  pain.  Musculoskeletal:  Positive for back pain. Negative for gait problem.  Skin:  Negative for pallor and rash.  Neurological:  Negative for dizziness, tremors, weakness, numbness and headaches.  Psychiatric/Behavioral:  Negative for confusion, sleep disturbance and suicidal ideas. The patient is not nervous/anxious.     Objective:  BP 128/76   Pulse 77   Temp 98 F (36.7 C) (Oral)   Ht '5\' 5"'$  (1.651 m)   Wt 258 lb 3.2 oz (117.1 kg)   SpO2 95%   BMI 42.97 kg/m   BP Readings from Last 3 Encounters:  07/05/22 128/76  05/24/22 138/90  12/14/21 124/76    Wt Readings from Last 3 Encounters:  07/05/22 258 lb 3.2 oz (117.1 kg)  05/24/22 261 lb 6.4 oz (118.6 kg)  12/14/21 258 lb (117 kg)    Physical Exam Constitutional:      General: She is not in acute distress.    Appearance: She is well-developed. She is obese.  HENT:     Head: Normocephalic.     Right Ear: External ear normal.     Left Ear: External ear normal.     Nose: Rhinorrhea present.  Eyes:     General:        Right eye: No discharge.        Left eye: No discharge.     Conjunctiva/sclera: Conjunctivae normal.     Pupils: Pupils are equal, round, and reactive  to light.  Neck:     Thyroid: No thyromegaly.     Vascular: No JVD.     Trachea: No tracheal deviation.  Cardiovascular:     Rate and Rhythm: Normal rate and regular rhythm.     Heart sounds: Normal heart sounds.  Pulmonary:     Effort: No respiratory distress.     Breath sounds: No stridor. No wheezing.  Abdominal:     General: Bowel sounds are normal. There is no distension.     Palpations: Abdomen is soft. There is no mass.     Tenderness: There is no abdominal tenderness. There is no guarding or rebound.  Musculoskeletal:        General: Swelling present. No tenderness.     Cervical back: Normal range of motion and neck supple. No rigidity.     Right lower leg: Edema present.     Left lower leg: Edema present.  Lymphadenopathy:      Cervical: No cervical adenopathy.  Skin:    Findings: No erythema or rash.  Neurological:     Mental Status: She is oriented to person, place, and time.     Cranial Nerves: No cranial nerve deficit.     Motor: No abnormal muscle tone.     Coordination: Coordination normal.     Gait: Gait abnormal.     Deep Tendon Reflexes: Reflexes normal.  Psychiatric:        Behavior: Behavior normal.        Thought Content: Thought content normal.        Judgment: Judgment normal.   Trace edema on both legs and feet  Lab Results  Component Value Date   WBC 7.6 05/24/2022   HGB 14.6 05/24/2022   HCT 45.3 05/24/2022   PLT 246.0 05/24/2022   GLUCOSE 108 (H) 05/24/2022   CHOL 234 (H) 05/24/2022   TRIG 115.0 05/24/2022   HDL 49.50 05/24/2022   LDLDIRECT 181.4 05/01/2013   LDLCALC 162 (H) 05/24/2022   ALT 36 (H) 05/24/2022   AST 32 05/24/2022   NA 138 05/24/2022   K 4.3 05/24/2022   CL 107 05/24/2022   CREATININE 0.86 05/24/2022   BUN 18 05/24/2022   CO2 25 05/24/2022   TSH 2.82 05/24/2022   INR 0.99 09/19/2018   HGBA1C 6.0 02/06/2008    VAS [place] LOWER EXTREMITY VENOUS (DVT)  Result Date: 05/28/2022  Lower Venous DVT Study Patient Name:  Latasha Hicks  Date of Exam:   05/28/2022 Medical Rec #: [id number]           Accession #:    [id number] Date of Birth: 1961-07-22          Patient Gender: F Patient Age:   10 years Exam Location:  [facility] Procedure:      VAS [place] LOWER EXTREMITY VENOUS (DVT) Referring Phys: Tyrone Apple Charlina Dwight --------------------------------------------------------------------------------  Indications: Swelling, and Slightly elevated d-dimer.  Comparison Study: No previous exam noted. Performing Technologist: Bobetta Lime BS, RVT  Examination Guidelines: A complete evaluation includes B-mode imaging, spectral Doppler, color Doppler, and power Doppler as needed of all accessible portions of each vessel. Bilateral testing is considered an integral part of a complete  examination. Limited examinations for reoccurring indications may be performed as noted. The reflux portion of the exam is performed with the patient in reverse Trendelenburg.  +---------+---------------+---------+-----------+----------+--------------+ RIGHT    CompressibilityPhasicitySpontaneityPropertiesThrombus Aging +---------+---------------+---------+-----------+----------+--------------+ CFV      Full  Yes      Yes                                 +---------+---------------+---------+-----------+----------+--------------+ SFJ      Full                                                        +---------+---------------+---------+-----------+----------+--------------+ FV Prox  Full                                                        +---------+---------------+---------+-----------+----------+--------------+ FV Mid   Full                                                        +---------+---------------+---------+-----------+----------+--------------+ FV DistalFull                                                        +---------+---------------+---------+-----------+----------+--------------+ PFV      Full                                                        +---------+---------------+---------+-----------+----------+--------------+ POP      Full           Yes      Yes                                 +---------+---------------+---------+-----------+----------+--------------+ PTV      Full                                                        +---------+---------------+---------+-----------+----------+--------------+ PERO     Full                                                        +---------+---------------+---------+-----------+----------+--------------+   +---------+---------------+---------+-----------+----------+--------------+ LEFT     CompressibilityPhasicitySpontaneityPropertiesThrombus Aging  +---------+---------------+---------+-----------+----------+--------------+ CFV      Full           Yes      Yes                                 +---------+---------------+---------+-----------+----------+--------------+ SFJ  Full                                                        +---------+---------------+---------+-----------+----------+--------------+ FV Prox  Full                                                        +---------+---------------+---------+-----------+----------+--------------+ FV Mid   Full                                                        +---------+---------------+---------+-----------+----------+--------------+ FV DistalFull                                                        +---------+---------------+---------+-----------+----------+--------------+ PFV      Full                                                        +---------+---------------+---------+-----------+----------+--------------+ POP      Full           Yes      Yes                                 +---------+---------------+---------+-----------+----------+--------------+ PTV      Full                                                        +---------+---------------+---------+-----------+----------+--------------+ PERO     Full                                                        +---------+---------------+---------+-----------+----------+--------------+     Summary: BILATERAL: - No evidence of deep vein thrombosis seen in the lower extremities, bilaterally. -No evidence of popliteal cyst, bilaterally.   *See table(s) above for measurements and observations. Electronically signed by Deitra Mayo MD on 05/28/2022 at 4:26:32 PM.    Final     Assessment & Plan:   Problem List Items Addressed This Visit     Leg edema    Continue with furosemide Weight loss should help Compression socks or sleeves.  Elevate legs      OBESITY, MORBID     Start [id number] --will start at 0.5 mg weekly and move to 1 mg weekly.  Lower dosages have been unavailable. Diet/exercise  discussed BMI 42.9      Relevant Medications   Semaglutide-Weight Management (WEGOVY) 1 MG/0.5ML SOAJ   Vitamin D deficiency    Continue with vitamin D         Meds ordered this encounter  Medications   Semaglutide-Weight Management (WEGOVY) 1 MG/0.5ML SOAJ    Sig: Inject 1 mg into the skin once a week.    Dispense:  2 mL    Refill:  3      Follow-up: Return in about 3 months (around 10/05/2022) for a follow-up visit.  Walker Kehr, MD

## 2022-07-05 NOTE — Assessment & Plan Note (Addendum)
Start Wegovy --will start at 0.5 mg weekly and move to 1 mg weekly.  Lower dosages have been unavailable. Diet/exercise discussed BMI 42.9

## 2022-07-05 NOTE — Assessment & Plan Note (Signed)
Continue with furosemide Weight loss should help Compression socks or sleeves.  Elevate legs

## 2022-07-05 NOTE — Assessment & Plan Note (Signed)
Continue with vitamin D 

## 2022-08-02 ENCOUNTER — Ambulatory Visit (INDEPENDENT_AMBULATORY_CARE_PROVIDER_SITE_OTHER): Payer: 59 | Admitting: Orthopedic Surgery

## 2022-08-02 DIAGNOSIS — M9261 Juvenile osteochondrosis of tarsus, right ankle: Secondary | ICD-10-CM | POA: Diagnosis not present

## 2022-08-03 ENCOUNTER — Encounter: Payer: Self-pay | Admitting: Orthopedic Surgery

## 2022-08-03 NOTE — Progress Notes (Signed)
Office Visit Note   Patient: Latasha Hicks           Date of Birth: 02/07/1961           MRN: [id number] Visit Date: 08/02/2022              Requested by: Cassandria Anger, MD [place],  [place] [address] PCP: Plotnikov, Evie Lacks, MD  Chief Complaint  Patient presents with   Right Foot - Pain      HPI: Patient is a 61 year old woman who is seen for initial evaluation for painful Haglund's deformity right heel.  Patient states she has pain with shoewear pain with ambulation.  She has had symptoms for several months no specific injury.  Assessment & Plan: Visit Diagnoses:  1. Haglund's deformity of right heel     Plan: Recommended continue conservative therapy with using Voltaren gel Achilles stretching and Hoka sneakers to elevate the heel and unload the Achilles insertion.  Discussed that if she fails conservative therapy we could proceed with takedown of the Achilles resection of the Haglund's deformity reinsertion of the tendon.  Discussed risks and benefits including risk of the Achilles not healing risk of the incision not healing.  Follow-Up Instructions: Return if symptoms worsen or fail to improve.   Ortho Exam  Patient is alert, oriented, no adenopathy, well-dressed, normal affect, normal respiratory effort. Examination patient has a palpable dorsalis pedis pulse she has good dorsiflexion of the ankle of 20 degrees there are no ulcers no calluses no skin breakdown.  Patient is tender to palpation at the insertion of the Achilles on the Haglund's deformity.  Radiograph shows bony prominence and calcification of the Achilles insertion.  Imaging: No results found. No images are attached to the encounter.  Labs: Lab Results  Component Value Date   HGBA1C 6.0 02/06/2008   ESRSEDRATE 24 03/06/2007     Lab Results  Component Value Date   ALBUMIN 3.8 05/24/2022   ALBUMIN 3.8 06/12/2021   ALBUMIN 3.4 (L) 09/19/2018    No results found  for: "MG" Lab Results  Component Value Date   VD25OH 28.49 (L) 05/24/2022   VD25OH 18.76 (L) 06/12/2021   VD25OH 22.96 (L) 08/29/2018    No results found for: "PREALBUMIN"    Latest Ref Rng & Units 05/24/2022   12:25 PM 06/12/2021   10:21 AM 01/27/2019   10:12 AM  CBC EXTENDED  WBC 4.0 - 10.5 K/uL 7.6  6.6  5.5   RBC 3.87 - 5.11 Mil/uL 4.78  4.78  4.65   Hemoglobin 12.0 - 15.0 g/dL 14.6  15.2  14.0   HCT 36.0 - 46.0 % 45.3  45.1  45.6   Platelets 150.0 - 400.0 K/uL 246.0  252.0  238   NEUT# 1.4 - 7.7 K/uL 5.1  4.1    Lymph# 0.7 - 4.0 K/uL 1.7  1.8       There is no height or weight on file to calculate BMI.  Orders:  No orders of the defined types were placed in this encounter.  No orders of the defined types were placed in this encounter.    Procedures: No procedures performed  Clinical Data: No additional findings.  ROS:  All other systems negative, except as noted in the HPI. Review of Systems  Objective: Vital Signs: There were no vitals taken for this visit.  Specialty Comments:  No specialty comments available.  PMFS History: Patient Active Problem List   Diagnosis Date  Noted   Plantar fasciitis of right foot 05/24/2022   Leg edema 05/24/2022   COVID-19 10/02/2021   Acute cough 10/02/2021   Stye 05/23/2021   Nephrolithiasis 05/23/2021   Primary osteoarthritis of left knee 09/29/2018   Hx of total knee replacement, left 09/29/2018   Colon polyp 08/28/2018   Diarrhea 07/10/2018   Chronic pain of right knee 05/01/2018   Hematuria 12/24/2017   Left flank pain 12/24/2017   Skin lesion 04/24/2017   Symptomatic abdominal apron 04/15/2017   H/O laparoscopic adjustable gastric banding 04/15/2017   Well adult exam 10/02/2016   Salivary gland infection 06/01/2016   Paresthesia 06/10/2015   Left leg swelling 07/26/2014   GI bleed 09/03/2013   Urinary incontinence 08/21/2013   Internal hemorrhoids 08/21/2013   Unspecified constipation 06/12/2013   LBP  (low back pain) 06/12/2013   History of colonic polyps 06/12/2013   Right LBP 05/06/2013   Perianal abscess 05/01/2013   GRIEF REACTION 03/26/2008   ALLERGIC RHINITIS 03/26/2008   OSTEOARTHRITIS 03/26/2008   Bariatric surgery status 03/26/2008   Vitamin D deficiency 07/05/2007   OBESITY, MORBID 07/05/2007   Obstructive sleep apnea 07/05/2007   VENOUS INSUFFICIENCY, CHRONIC 07/05/2007   Calculus of gallbladder 07/05/2007   HYDRONEPHROSIS 07/05/2007   Past Medical History:  Diagnosis Date   Allergic rhinitis    Complication of anesthesia    "hard to wake up in past"   COVID-19 09/2021   Endometriosis    Hematuria    d/t kidney stones    History of kidney stones    History of sleep apnea    NO OSA SINCE WEIGHT LOSS SURGERY AND WEIGHT LOSS 2009   Hydronephrosis    Kidney stones    Osteoarthritis    Personal history of colonic polyps    Question accuracy, patient report only.   Sleep apnea    Urinary incontinence    VENOUS INSUFFICIENCY, CHRONIC 07/05/2007   Annotation: LE Qualifier: Diagnosis of  By: Dance CMA (AAMA), Kim     Vitamin D deficiency     Family History  Problem Relation Age of Onset   Diabetes Mother    Cervical cancer Mother    Hypertension Mother    Rectal cancer Neg Hx    Stomach cancer Neg Hx    Heart disease Neg Hx    Colon cancer Neg Hx     Past Surgical History:  Procedure Laterality Date   ABDOMINAL SURGERY     ACHILLES TENDON SURGERY Left 12/2017   BREAST CYST EXCISION Left    COLONOSCOPY N/A 09/04/2013   Procedure: COLONOSCOPY;  Surgeon: Inda Castle, MD;  Location: [facility];  Service: Endoscopy;  Laterality: N/A;   COLONOSCOPY W/ POLYPECTOMY     CYSTOSCOPY WITH RETROGRADE PYELOGRAM, URETEROSCOPY AND STENT PLACEMENT Left 01/30/2019   Procedure: CYSTOSCOPY WITH RETROGRADE PYELOGRAM, URETEROSCOPY AND STENT PLACEMENT;  Surgeon: Cleon Gustin, MD;  Location: [facility];  Service: Urology;  Laterality: Left;  1 HR   CYSTOSCOPY WITH  RETROGRADE PYELOGRAM, URETEROSCOPY AND STENT PLACEMENT Bilateral 04/19/2020   Procedure: CYSTOSCOPY WITH RETROGRADE PYELOGRAM, URETEROSCOPY AND STENT PLACEMENT;  Surgeon: Cleon Gustin, MD;  Location: [facility];  Service: Urology;  Laterality: Bilateral;  2 HRS   CYSTOSCOPY WITH RETROGRADE PYELOGRAM, URETEROSCOPY AND STENT PLACEMENT N/A 05/17/2020   Procedure: CYSTOSCOPY WITH RIGHT RETROGRADE PYELOGRAM, INTRAOPERATIVE FLUOROSCOPY, under one hour, with INTERPRETATION, RIGHT DIAGNOSTIC URETEROSCOPY AND BILATERAL URETERAL STENT REMOVAL;  Surgeon: Cleon Gustin, MD;  Location: [facility];  Service: Urology;  Laterality: N/A;  30 MINS   DILATION AND CURETTAGE OF UTERUS     HIATAL HERNIA REPAIR     HOLMIUM LASER APPLICATION Left 01/26/2950   Procedure: HOLMIUM LASER APPLICATION;  Surgeon: Cleon Gustin, MD;  Location: [facility];  Service: Urology;  Laterality: Left;   HOLMIUM LASER APPLICATION Bilateral 8/84/1660   Procedure: HOLMIUM LASER APPLICATION;  Surgeon: Cleon Gustin, MD;  Location: [facility];  Service: Urology;  Laterality: Bilateral;   lap band replacement  2012   LAPAROSCOPIC CHOLECYSTECTOMY     LAPAROSCOPIC GASTRIC BANDING  2009   TOTAL KNEE ARTHROPLASTY Left 09/29/2018   Procedure: LEFT TOTAL KNEE ARTHROPLASTY;  Surgeon: Leandrew Koyanagi, MD;  Location: [facility];  Service: Orthopedics;  Laterality: Left;   VAGINAL HYSTERECTOMY     partial   Social History   Occupational History   Not on file  Tobacco Use   Smoking status: Never   Smokeless tobacco: Never  Vaping Use   Vaping Use: Never used  Substance and Sexual Activity   Alcohol use: Yes    Comment: rarely   Drug use: No   Sexual activity: Yes    Birth control/protection: Post-menopausal

## 2022-08-06 ENCOUNTER — Ambulatory Visit (INDEPENDENT_AMBULATORY_CARE_PROVIDER_SITE_OTHER): Payer: 59 | Admitting: Internal Medicine

## 2022-08-06 ENCOUNTER — Encounter: Payer: Self-pay | Admitting: Internal Medicine

## 2022-08-06 VITALS — BP 122/76 | HR 82 | Ht 65.5 in | Wt 257.0 lb

## 2022-08-06 DIAGNOSIS — R21 Rash and other nonspecific skin eruption: Secondary | ICD-10-CM | POA: Insufficient documentation

## 2022-08-06 MED ORDER — METHYLPREDNISOLONE ACETATE 40 MG/ML IJ SUSP
40.0000 mg | Freq: Once | INTRAMUSCULAR | Status: AC
  Administered 2022-08-06: 40 mg via INTRAMUSCULAR

## 2022-08-06 NOTE — Patient Instructions (Signed)
We have given you a steroid shot today to help.   Keep using the cream.  It is okay to use claritin or zyrtec 1 pill 3 times a day to help the itching.

## 2022-08-06 NOTE — Assessment & Plan Note (Signed)
Given depo-medrol 40 mg IM at visit. Continue nystatin/triamcinolone ointment on the rash. Use claritin 10 mg TID for itching and avoid scratching.

## 2022-08-06 NOTE — Addendum Note (Signed)
Addended by: Elta Guadeloupe on: 08/06/2022 04:10 PM   Modules accepted: Orders

## 2022-08-06 NOTE — Progress Notes (Signed)
   Subjective:   Patient ID: Latasha Hicks, female    DOB: [birth year], 61 y.o.   MRN: [id number]  Rash Pertinent negatives include no cough, diarrhea, shortness of breath or vomiting.   The patient is a 61 YO female coming in for rash and itching.  Review of Systems  Constitutional: Negative.   HENT: Negative.    Eyes: Negative.   Respiratory:  Negative for cough, chest tightness and shortness of breath.   Cardiovascular:  Negative for chest pain, palpitations and leg swelling.  Gastrointestinal:  Negative for abdominal distention, abdominal pain, constipation, diarrhea, nausea and vomiting.  Musculoskeletal: Negative.   Skin:  Positive for rash.  Neurological: Negative.   Psychiatric/Behavioral: Negative.      Objective:  Physical Exam Constitutional:      Appearance: She is well-developed.  HENT:     Head: Normocephalic and atraumatic.  Cardiovascular:     Rate and Rhythm: Normal rate and regular rhythm.  Pulmonary:     Effort: Pulmonary effort is normal. No respiratory distress.     Breath sounds: Normal breath sounds. No wheezing or rales.  Abdominal:     General: Bowel sounds are normal. There is no distension.     Palpations: Abdomen is soft.     Tenderness: There is no abdominal tenderness. There is no rebound.  Musculoskeletal:     Cervical back: Normal range of motion.  Skin:    General: Skin is warm and dry.     Findings: Rash present.  Neurological:     Mental Status: She is alert and oriented to person, place, and time.     Coordination: Coordination normal.     Vitals:   08/06/22 1431  BP: 122/76  Pulse: 82  SpO2: 95%  Weight: 257 lb (116.6 kg)  Height: 5' 5.5" (1.664 m)    Assessment & Plan:  Depo-medrol 40 mg IM given at visit

## 2022-08-22 ENCOUNTER — Other Ambulatory Visit: Payer: Self-pay | Admitting: Internal Medicine

## 2022-09-28 ENCOUNTER — Telehealth: Payer: Self-pay | Admitting: Internal Medicine

## 2022-09-28 NOTE — Telephone Encounter (Signed)
Patient called and said that last time she saw Dr Alain Marion he agreed to see patients husband as a new patient. Is this okay to schedule?

## 2022-09-28 NOTE — Telephone Encounter (Signed)
Okay.  Thanks.

## 2022-10-02 NOTE — Telephone Encounter (Signed)
Scheduled

## 2022-10-04 ENCOUNTER — Ambulatory Visit: Payer: 59 | Admitting: Internal Medicine

## 2023-02-26 ENCOUNTER — Ambulatory Visit (INDEPENDENT_AMBULATORY_CARE_PROVIDER_SITE_OTHER): Payer: Commercial Managed Care - PPO

## 2023-02-26 ENCOUNTER — Ambulatory Visit: Payer: Commercial Managed Care - PPO | Admitting: Sports Medicine

## 2023-02-26 VITALS — BP 124/80 | HR 71 | Ht 65.0 in | Wt 263.0 lb

## 2023-02-26 DIAGNOSIS — R29898 Other symptoms and signs involving the musculoskeletal system: Secondary | ICD-10-CM | POA: Diagnosis not present

## 2023-02-26 DIAGNOSIS — M542 Cervicalgia: Secondary | ICD-10-CM

## 2023-02-26 DIAGNOSIS — G540 Brachial plexus disorders: Secondary | ICD-10-CM | POA: Diagnosis not present

## 2023-02-26 MED ORDER — MELOXICAM 15 MG PO TABS
15.0000 mg | ORAL_TABLET | Freq: Every day | ORAL | 0 refills | Status: DC
Start: 2023-02-26 — End: 2023-07-15

## 2023-02-26 MED ORDER — CYCLOBENZAPRINE HCL 5 MG PO TABS: 5.0000 mg | ORAL_TABLET | Freq: Every day | ORAL | 0 refills | Status: AC

## 2023-02-26 NOTE — Patient Instructions (Addendum)
Good to see you Neck HEP Pt referral  - Start meloxicam 15 mg daily x2 weeks.  If still having pain after 2 weeks, complete 3rd-week of meloxicam. May use remaining meloxicam as needed once daily for pain control.  Do not to use additional NSAIDs while taking meloxicam.  May use Tylenol [phone] mg 2 to 3 times a day for breakthrough pain. Flexeril 5-10 mg nightly as needed for muscle spasm  4 week follow up

## 2023-02-26 NOTE — Progress Notes (Signed)
Latasha Hicks D.[facility] [place] Phone: [phone]   Assessment and Plan:     1. Neck pain 2. Left arm weakness 3. 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  Patient does have cervical DDD, however physical exam is more consistent with a more lateral cause of pain and neurologic symptoms such as thoracic outlet syndrome. - Patient's following position consisted of bilaterally outstretched arms that were forced into extension and external rotation, potentially stretching thoracic outlet versus latissimus dorsi - X-ray obtained in clinic to look for cervicogenic cause of patient's symptoms.  My interpretation: No acute fracture or vertebral collapse.  Degenerative changes most significant in lower cervical spine with DDD and anterior cervical spurring - Start HEP and physical therapy for thoracic outlet syndrome - Start meloxicam 15 mg daily x2 weeks.  If still having pain after 2 weeks, complete 3rd-week of meloxicam. May use remaining meloxicam as needed once daily for pain control.  Do not to use additional NSAIDs while taking meloxicam.  May use Tylenol [phone] mg 2 to 3 times a day for breakthrough pain. - Start Flexeril 5 to 10 mg nightly as needed for muscle spasms  Pertinent previous records reviewed include none   Follow Up: 4 weeks for reevaluation.  Could consider x-ray imaging of thoracic outlet, left shoulder versus advanced imaging   Subjective:   I, Moenique Parris, am serving as a Education administrator for Doctor Glennon Mac  Chief Complaint: left arm weakness   HPI:   02/26/23 Patient is a 62 year old female complaining of left arm weakness. Patient states that she has had pain for 7 weeks, tingling down  her arm , pain under shoulder blade and armpit,intermittent pain in the arm down to the hand , tylenol and ibu for the pain muscle relaxer didn't help as well, decreased ROM , no MOI, no pain radiating up he neck, today is the firs day it felt okay,   Relevant Historical Information: None pertinent  Additional pertinent review of systems negative.   Current Outpatient Medications:    allopurinol (ZYLOPRIM) 100 MG tablet, Take 1 tablet (100 mg total) by mouth daily., Disp: 90 tablet, Rfl: 3   b complex vitamins tablet, Take 1 tablet by mouth daily., Disp: 100 tablet, Rfl: 3   Cholecalciferol (VITAMIN D3) 50 MCG (2000 UT) capsule, Take 1 capsule (2,000 Units total) by mouth daily., Disp: 100 capsule, Rfl: 3   Cyanocobalamin (VITAMIN B-12) 1000 MCG SUBL, Place 1 tablet (1,000 mcg total) under the tongue daily., Disp: 100 tablet, Rfl: 3   cyclobenzaprine (FLEXERIL) 5 MG tablet, Take 1 tablet (5 mg total) by mouth at bedtime., Disp: 30 tablet, Rfl: 0   famotidine (PEPCID) 40 MG tablet, Take 1 tablet (40 mg total) by mouth daily., Disp: 90 tablet, Rfl: 3   furosemide (LASIX) 20 MG tablet, TAKE 1 TABLET BY MOUTH EVERY DAY AS NEEDED, Disp: 90 tablet, Rfl: 1   meloxicam (MOBIC) 15 MG tablet, Take 1 tablet (15 mg total) by mouth daily., Disp: 30 tablet, Rfl: 0   potassium citrate (UROCIT-K) 5 MEQ (540 MG) SR tablet, Take 1 tablet (5 mEq total) by mouth 3 (three) times daily with meals., Disp: 90 tablet, Rfl: 11   Probiotic Product (PROBIOTIC-10 PO), Take by mouth., Disp: ,  Rfl:    Semaglutide-Weight Management (WEGOVY) 1 MG/0.5ML SOAJ, Inject 1 mg into the skin once a week., Disp: 2 mL, Rfl: 3   Objective:     Vitals:   02/26/23 0932  BP: 124/80  Pulse: 71  SpO2: 96%  Weight: 263 lb (119.3 kg)  Height: 5\' 5"  (1.651 m)      Body mass index is 43.77 kg/m.    Physical Exam:    Neck Exam: Cervical Spine- Posture normal Skin- normal, intact  Neuro:  Strength-  Right Left   Deltoid (C5)  5/5 5/5  Bicep/Brachioradialis (C5/6) 5/5  5/5  Wrist Extension (C6) 5/5 5/5  Tricep (C7) 5/5 5/5  Wrist Flexion (C7) 5/5 5/5  Grip (C8) 5/5 5/5  Finger Abduction (T1) 5/5 5/5   Sensation: Decreased sensation over digits 1-3 in left upper extremity versus right, otherwise intact to light touch in upper extremities bilaterally  Positive Tinel's on the left  Spurling's:  negative bilaterally Neck ROM: Full active ROM Shoulder ROM: Full ROM bilaterally without restriction TTP: Left trapezius NTTP: cervical spinous processes, cervical paraspinal, thoracic paraspinal, right trapezius Pain with resisted internal rotation of the left shoulder with pain under patient's armpit radiating to back along latissimus dorsi at insertion    Electronically signed by:  Latasha Hicks D.[facility] 10:05 AM 02/26/23

## 2023-03-13 ENCOUNTER — Ambulatory Visit: Payer: 59 | Attending: Sports Medicine | Admitting: Physical Therapy

## 2023-03-13 ENCOUNTER — Encounter: Payer: Self-pay | Admitting: Physical Therapy

## 2023-03-13 ENCOUNTER — Other Ambulatory Visit: Payer: Self-pay

## 2023-03-13 DIAGNOSIS — M542 Cervicalgia: Secondary | ICD-10-CM | POA: Diagnosis not present

## 2023-03-13 DIAGNOSIS — G540 Brachial plexus disorders: Secondary | ICD-10-CM | POA: Diagnosis present

## 2023-03-13 DIAGNOSIS — R208 Other disturbances of skin sensation: Secondary | ICD-10-CM | POA: Diagnosis present

## 2023-03-13 DIAGNOSIS — R29898 Other symptoms and signs involving the musculoskeletal system: Secondary | ICD-10-CM | POA: Diagnosis present

## 2023-03-13 NOTE — Therapy (Signed)
OUTPATIENT PHYSICAL THERAPY CERVICAL EVALUATION   Patient Name: Latasha Hicks MRN: [id number] DOB:June 30, 1961, 62 y.o., female Today's Date: 03/13/2023  END OF SESSION:  PT End of Session - 03/13/23 1057     Visit Number 1    Number of Visits 6    Date for PT Re-Evaluation 04/24/23    Authorization Type [employer] Medicare    Progress Note Due on Visit 10    PT Start Time 1100    PT Stop Time 1145    PT Time Calculation (min) 45 min    Activity Tolerance Patient tolerated treatment well    Behavior During Therapy WFL for tasks assessed/performed             Past Medical History:  Diagnosis Date   Allergic rhinitis    Complication of anesthesia    "hard to wake up in past"   COVID-19 09/2021   Endometriosis    Hematuria    d/t kidney stones    History of kidney stones    History of sleep apnea    NO OSA SINCE WEIGHT LOSS SURGERY AND WEIGHT LOSS 2009   Hydronephrosis    Kidney stones    Osteoarthritis    Personal history of colonic polyps    Question accuracy, patient report only.   Sleep apnea    Urinary incontinence    VENOUS INSUFFICIENCY, CHRONIC 07/05/2007   Annotation: LE Qualifier: Diagnosis of  By: Dance CMA (AAMA), Kim     Vitamin D deficiency    Past Surgical History:  Procedure Laterality Date   ABDOMINAL SURGERY     ACHILLES TENDON SURGERY Left 12/2017   BREAST CYST EXCISION Left    COLONOSCOPY N/A 09/04/2013   Procedure: COLONOSCOPY;  Surgeon: Louis Meckel, MD;  Location: [facility] ENDOSCOPY;  Service: Endoscopy;  Laterality: N/A;   COLONOSCOPY W/ POLYPECTOMY     CYSTOSCOPY WITH RETROGRADE PYELOGRAM, URETEROSCOPY AND STENT PLACEMENT Left 01/30/2019   Procedure: CYSTOSCOPY WITH RETROGRADE PYELOGRAM, URETEROSCOPY AND STENT PLACEMENT;  Surgeon: Malen Gauze, MD;  Location: [facility];  Service: Urology;  Laterality: Left;  1 HR   CYSTOSCOPY WITH RETROGRADE PYELOGRAM, URETEROSCOPY AND STENT PLACEMENT Bilateral 04/19/2020   Procedure: CYSTOSCOPY WITH  RETROGRADE PYELOGRAM, URETEROSCOPY AND STENT PLACEMENT;  Surgeon: Malen Gauze, MD;  Location: [facility];  Service: Urology;  Laterality: Bilateral;  2 HRS   CYSTOSCOPY WITH RETROGRADE PYELOGRAM, URETEROSCOPY AND STENT PLACEMENT N/A 05/17/2020   Procedure: CYSTOSCOPY WITH RIGHT RETROGRADE PYELOGRAM, INTRAOPERATIVE FLUOROSCOPY, under one hour, with INTERPRETATION, RIGHT DIAGNOSTIC URETEROSCOPY AND BILATERAL URETERAL STENT REMOVAL;  Surgeon: Malen Gauze, MD;  Location: [facility];  Service: Urology;  Laterality: N/A;  30 MINS   DILATION AND CURETTAGE OF UTERUS     HIATAL HERNIA REPAIR     HOLMIUM LASER APPLICATION Left 01/30/2019   Procedure: HOLMIUM LASER APPLICATION;  Surgeon: Malen Gauze, MD;  Location: [facility];  Service: Urology;  Laterality: Left;   HOLMIUM LASER APPLICATION Bilateral 04/19/2020   Procedure: HOLMIUM LASER APPLICATION;  Surgeon: Malen Gauze, MD;  Location: [facility];  Service: Urology;  Laterality: Bilateral;   lap band replacement  2012   LAPAROSCOPIC CHOLECYSTECTOMY     LAPAROSCOPIC GASTRIC BANDING  2009   TOTAL KNEE ARTHROPLASTY Left 09/29/2018   Procedure: LEFT TOTAL KNEE ARTHROPLASTY;  Surgeon: Tarry Kos, MD;  Location: [facility];  Service: Orthopedics;  Laterality: Left;   VAGINAL HYSTERECTOMY     partial  Patient Active Problem List   Diagnosis Date Noted   Rash 08/06/2022   Plantar fasciitis of right foot 05/24/2022   Leg edema 05/24/2022   COVID-19 10/02/2021   Acute cough 10/02/2021   Stye 05/23/2021   Nephrolithiasis 05/23/2021   Primary osteoarthritis of left knee 09/29/2018   Hx of total knee replacement, left 09/29/2018   Colon polyp 08/28/2018   Diarrhea 07/10/2018   Chronic pain of right knee 05/01/2018   Hematuria 12/24/2017   Left flank pain 12/24/2017   Skin lesion 04/24/2017   Symptomatic abdominal apron 04/15/2017   H/O laparoscopic adjustable gastric banding  04/15/2017   Well adult exam 10/02/2016   Salivary gland infection 06/01/2016   Paresthesia 06/10/2015   Left leg swelling 07/26/2014   GI bleed 09/03/2013   Urinary incontinence 08/21/2013   Internal hemorrhoids 08/21/2013   Unspecified constipation 06/12/2013   LBP (low back pain) 06/12/2013   History of colonic polyps 06/12/2013   Right LBP 05/06/2013   Perianal abscess 05/01/2013   GRIEF REACTION 03/26/2008   ALLERGIC RHINITIS 03/26/2008   OSTEOARTHRITIS 03/26/2008   Bariatric surgery status 03/26/2008   Vitamin D deficiency 07/05/2007   OBESITY, MORBID 07/05/2007   Obstructive sleep apnea 07/05/2007   VENOUS INSUFFICIENCY, CHRONIC 07/05/2007   Calculus of gallbladder 07/05/2007   HYDRONEPHROSIS 07/05/2007    PCP: Jacinta Shoe, MD  REFERRING PROVIDER: Richardean Sale, DO  REFERRING DIAG: M54.2 (ICD-10-CM) - Neck pain R29.898 (ICD-10-CM) - Left arm weakness G54.0 (ICD-10-CM) - Thoracic outlet syndrome of left thoracic outlet  THERAPY DIAG:  Thoracic outlet syndrome  Other disturbances of skin sensation  Other symptoms and signs involving the musculoskeletal system  Rationale for Evaluation and Treatment: Rehabilitation  ONSET DATE: 12/30/22  SUBJECTIVE:                                                                                                                                                                                                         SUBJECTIVE STATEMENT: Pt reports fall several weeks ago (12/30/22). Pt states that she extended her L arm and has had numbness down her arm and into her hand. Pt states she is getting a random pain under her arm and into her arm. Pt reports constant numbness (reports it as a 5/10 numbness) Hand dominance: Right  PERTINENT HISTORY:  Per H&P from referring provider: Patient is a 62 year old female complaining of left arm weakness. Patient states that she has had pain for 7 weeks, tingling down her arm , pain under  shoulder blade and armpit,intermittent pain in the  arm down to the hand  PAIN:  Are you having pain? Yes: NPRS scale: 0 current, 9 at worst/10 Pain location: under arm pit and into arm Pain description: shooting Aggravating factors: not sure-- feels it comes randomly Relieving factors: Rest  PRECAUTIONS: None  WEIGHT BEARING RESTRICTIONS: No  FALLS:  Has patient fallen in last 6 months? Yes. Number of falls 1  LIVING ENVIRONMENT: Lives with: lives with their spouse Lives in: House/apartment Stairs: No Has following equipment at home: None  OCCUPATION: Retired; gardening -- flowers, planting  PLOF: Independent  PATIENT GOALS: Improve N/T -- be able to hold new grandchild safely  NEXT MD VISIT: Not indicated in chart  OBJECTIVE:   DIAGNOSTIC FINDINGS:  None on file  PATIENT SURVEYS:  FOTO 78; predicted 80  COGNITION: Overall cognitive status: Within functional limits for tasks assessed  SENSATION: Constant N/T throughout L UE into all fingers  POSTURE: rounded shoulders, forward head, and increased thoracic kyphosis  PALPATION: TTP L UT, pec major, pec minor, medial border of scapula; no overt anterior scalene or first rib tightness/hypomobility  CERVICAL ROM:   Active ROM A/PROM (deg) eval  Flexion WNL pulls in L shoulder blade  Extension WNL  Right lateral flexion 25  Left lateral flexion 25  Right rotation 40  Left rotation 50   (Blank rows = not tested)  UPPER EXTREMITY ROM: Grossly WFL   UPPER EXTREMITY MMT:  MMT Right eval Left eval  Shoulder flexion 4+ 4+  Shoulder extension 4+ 4  Shoulder abduction 4+ 4+  Shoulder adduction    Shoulder internal rotation 5 5  Shoulder external rotation 4 4  Middle trapezius 4- 3+  Lower trapezius    Elbow flexion 5 5  Elbow extension 5 5  Wrist flexion    Wrist extension 4 4+ pain  Wrist ulnar deviation    Wrist radial deviation    Wrist pronation    Wrist supination    Grip strength 55 lbs 40  lbs   (Blank rows = not tested)  CERVICAL SPECIAL TESTS:  Did not assess Roo's test (+) for increased symptoms on L UE neural tension testing (+) on L Tinel test (+) on L   FUNCTIONAL TESTS:  Did not assess  TODAY'S TREATMENT:                                                                                                                              DATE: 03/13/23 Therapeutic exercise - See HEP  Manual therapy -STM & TPR pec minor/major -Skilled assessment and palpation for TPDN Trigger Point Dry-Needling  Treatment instructions: Expect mild to moderate muscle soreness. S/S of pneumothorax if dry needled over a lung field, and to seek immediate medical attention should they occur. Patient verbalized understanding of these instructions and education.  Patient Consent Given: Yes Education handout provided: Yes Muscles treated: L Pec major, pec minor Electrical stimulation performed: No Parameters: N/A Treatment response/outcome: Improved muscle extensibility  PATIENT EDUCATION:  Education details: Exam findings, POC, initial HEP, TPDN Person educated: Patient Education method: Explanation, Demonstration, and Handouts Education comprehension: verbalized understanding, returned demonstration, and needs further education  HOME EXERCISE PROGRAM: Access Code: [email or web address] URL: [email or web address] Date: 03/13/2023 Prepared by: Vernon Prey April Kirstie Peri  Exercises - Seated Scapular Retraction  - 1 x daily - 7 x weekly - 2 sets - 10 reps - Supine Shoulder External Rotation in Abduction  - 1 x daily - 7 x weekly - 2 sets - 10 reps - Shoulder External Rotation in 45 Degrees Abduction  - 1 x daily - 7 x weekly - 3 sets - 10 reps - Corner Pec Minor Stretch  - 1 x daily - 7 x weekly - 2 sets - 30 sec hold - Doorway Pec Stretch at 60 Elevation  - 1 x daily - 7 x weekly - 3 sets - 10 reps  Patient Education - Trigger Point Dry Needling  ASSESSMENT:  CLINICAL  IMPRESSION: Patient is a 62 y.o. F who was seen today for physical therapy evaluation and treatment for L shoulder/neck pain and thoracic outlet syndrome. Assessment significant primarily for decreased sensation in L UE with currently no overt weakness in rotator cuff/shoulder. Pt demos L weaker than R posterior shoulder girdle with L>R pec minor/major and UT tightness with increased neural tension. Performed trial of TPDN this session to improve pec tightness. Pt will benefit from continued PT to improve her L UE function.   OBJECTIVE IMPAIRMENTS: decreased activity tolerance, decreased strength, increased fascial restrictions, impaired sensation, impaired UE functional use, improper body mechanics, postural dysfunction, and pain.   ACTIVITY LIMITATIONS: carrying, lifting, and caring for others  PARTICIPATION LIMITATIONS: meal prep, cleaning, driving, and yard work  PERSONAL FACTORS: Past/current experiences and Time since onset of injury/illness/exacerbation are also affecting patient's functional outcome.   REHAB POTENTIAL: Good  CLINICAL DECISION MAKING: Stable/uncomplicated  EVALUATION COMPLEXITY: Low   GOALS: Goals reviewed with patient? Yes  SHORT TERM GOALS: Target date: 04/03/2023  Pt will be ind with initial HEP Baseline:  Goal status: INITIAL  2.  Pt will be ind with maintaining shoulders down and back to decrease pec tightness Baseline:  Goal status: INITIAL   LONG TERM GOALS: Target date: 04/24/2023  Pt will be management and progression of HEP Baseline:  Goal status: INITIAL  2.  Pt will report decrease in N/T by >/=50% Baseline: Rates it 5/10 Goal status: INITIAL  3.  Pt will report no instances of pain/pulling in arm pit or shoulder blade to demo improved muscular imbalances Baseline:  Goal status: INITIAL  4.  Pt will be able to hold grandchild x 10 min with proper shoulder mechanics to demo increased functional shoulder strength/endurance Baseline:  Goal  status: INITIAL   PLAN:  PT FREQUENCY: 1x/week  PT DURATION: 6 weeks  PLANNED INTERVENTIONS: Therapeutic exercises, Therapeutic activity, Neuromuscular re-education, Balance training, Gait training, Patient/Family education, Self Care, Joint mobilization, Dry Needling, Electrical stimulation, Cryotherapy, Moist heat, Taping, Ionotophoresis /ml Dexamethasone, Manual therapy, and Re-evaluation  PLAN FOR NEXT SESSION: Assess response to HEP/TPDN. Consider more TPDN and manual work. Continue posterior shoulder girdle strengthening. Consider first rib mobilizations. Initiate nerve glides.    Eddith Mentor April Ma L Elmond Poehlman, PT 03/13/2023, 12:10 PM

## 2023-03-22 ENCOUNTER — Encounter: Payer: Self-pay | Admitting: Physical Therapy

## 2023-03-22 ENCOUNTER — Ambulatory Visit: Payer: 59 | Admitting: Physical Therapy

## 2023-03-22 DIAGNOSIS — G540 Brachial plexus disorders: Secondary | ICD-10-CM

## 2023-03-22 DIAGNOSIS — R208 Other disturbances of skin sensation: Secondary | ICD-10-CM

## 2023-03-22 DIAGNOSIS — R29898 Other symptoms and signs involving the musculoskeletal system: Secondary | ICD-10-CM

## 2023-03-22 NOTE — Therapy (Signed)
OUTPATIENT PHYSICAL THERAPY TREATMENT NOTE   Patient Name: Latasha Hicks MRN: [id number] DOB:1961/01/09, 62 y.o., female Today's Date: 03/22/2023  PCP: Jacinta Shoe, MD   REFERRING PROVIDER: Richardean Sale, DO   PT End of Session - 03/22/23 1003     Visit Number 2    Number of Visits 6    Date for PT Re-Evaluation 04/24/23    Authorization Type [employer] Medicare    Progress Note Due on Visit 10    PT Start Time 1000    PT Stop Time 1040    PT Time Calculation (min) 40 min    Activity Tolerance Patient tolerated treatment well    Behavior During Therapy [facility] for tasks assessed/performed             Past Medical History:  Diagnosis Date   Allergic rhinitis    Complication of anesthesia    "hard to wake up in past"   COVID-19 09/2021   Endometriosis    Hematuria    d/t kidney stones    History of kidney stones    History of sleep apnea    NO OSA SINCE WEIGHT LOSS SURGERY AND WEIGHT LOSS 2009   Hydronephrosis    Kidney stones    Osteoarthritis    Personal history of colonic polyps    Question accuracy, patient report only.   Sleep apnea    Urinary incontinence    VENOUS INSUFFICIENCY, CHRONIC 07/05/2007   Annotation: LE Qualifier: Diagnosis of  By: Dance CMA (AAMA), Kim     Vitamin D deficiency    Past Surgical History:  Procedure Laterality Date   ABDOMINAL SURGERY     ACHILLES TENDON SURGERY Left 12/2017   BREAST CYST EXCISION Left    COLONOSCOPY N/A 09/04/2013   Procedure: COLONOSCOPY;  Surgeon: Louis Meckel, MD;  Location[facility] ENDOSCOPY;  Service: Endoscopy;  Laterality: N/A;   COLONOSCOPY W/ POLYPECTOMY     CYSTOSCOPY WITH RETROGRADE PYELOGRAM, URETEROSCOPY AND STENT PLACEMENT Left 01/30/2019   Procedure: CYSTOSCOPY WITH RETROGRADE PYELOGRAM, URETEROSCOPY AND STENT PLACEMENT;  Surgeon: Malen Gauze, MD;  Location: [facility];  Service: Urology;  Laterality: Left;  1 HR   CYSTOSCOPY WITH RETROGRADE PYELOGRAM, URETEROSCOPY AND STENT PLACEMENT  Bilateral 04/19/2020   Procedure: CYSTOSCOPY WITH RETROGRADE PYELOGRAM, URETEROSCOPY AND STENT PLACEMENT;  Surgeon: Malen Gauze, MD;  Location: [facility];  Service: Urology;  Laterality: Bilateral;  2 HRS   CYSTOSCOPY WITH RETROGRADE PYELOGRAM, URETEROSCOPY AND STENT PLACEMENT N/A 05/17/2020   Procedure: CYSTOSCOPY WITH RIGHT RETROGRADE PYELOGRAM, INTRAOPERATIVE FLUOROSCOPY, under one hour, with INTERPRETATION, RIGHT DIAGNOSTIC URETEROSCOPY AND BILATERAL URETERAL STENT REMOVAL;  Surgeon: Malen Gauze, MD;  Location: [facility];  Service: Urology;  Laterality: N/A;  30 MINS   DILATION AND CURETTAGE OF UTERUS     HIATAL HERNIA REPAIR     HOLMIUM LASER APPLICATION Left 01/30/2019   Procedure: HOLMIUM LASER APPLICATION;  Surgeon: Malen Gauze, MD;  Location: [facility];  Service: Urology;  Laterality: Left;   HOLMIUM LASER APPLICATION Bilateral 04/19/2020   Procedure: HOLMIUM LASER APPLICATION;  Surgeon: Malen Gauze, MD;  Location: [facility];  Service: Urology;  Laterality: Bilateral;   lap band replacement  2012   LAPAROSCOPIC CHOLECYSTECTOMY     LAPAROSCOPIC GASTRIC BANDING  2009   TOTAL KNEE ARTHROPLASTY Left 09/29/2018   Procedure: LEFT TOTAL KNEE ARTHROPLASTY;  Surgeon: Tarry Kos, MD;  Location: [facility];  Service: Orthopedics;  Laterality: Left;  VAGINAL HYSTERECTOMY     partial   Patient Active Problem List   Diagnosis Date Noted   Rash 08/06/2022   Plantar fasciitis of right foot 05/24/2022   Leg edema 05/24/2022   COVID-19 10/02/2021   Acute cough 10/02/2021   Stye 05/23/2021   Nephrolithiasis 05/23/2021   Primary osteoarthritis of left knee 09/29/2018   Hx of total knee replacement, left 09/29/2018   Colon polyp 08/28/2018   Diarrhea 07/10/2018   Chronic pain of right knee 05/01/2018   Hematuria 12/24/2017   Left flank pain 12/24/2017   Skin lesion 04/24/2017   Symptomatic abdominal apron 04/15/2017    H/O laparoscopic adjustable gastric banding 04/15/2017   Well adult exam 10/02/2016   Salivary gland infection 06/01/2016   Paresthesia 06/10/2015   Left leg swelling 07/26/2014   GI bleed 09/03/2013   Urinary incontinence 08/21/2013   Internal hemorrhoids 08/21/2013   Unspecified constipation 06/12/2013   LBP (low back pain) 06/12/2013   History of colonic polyps 06/12/2013   Right LBP 05/06/2013   Perianal abscess 05/01/2013   GRIEF REACTION 03/26/2008   ALLERGIC RHINITIS 03/26/2008   OSTEOARTHRITIS 03/26/2008   Bariatric surgery status 03/26/2008   Vitamin D deficiency 07/05/2007   OBESITY, MORBID 07/05/2007   Obstructive sleep apnea 07/05/2007   VENOUS INSUFFICIENCY, CHRONIC 07/05/2007   Calculus of gallbladder 07/05/2007   HYDRONEPHROSIS 07/05/2007    THERAPY DIAG:  Thoracic outlet syndrome  Other disturbances of skin sensation  Other symptoms and signs involving the musculoskeletal system   Rationale for Evaluation and Treatment: Rehabilitation  REFERRING DIAG: M54.2 (ICD-10-CM) - Neck pain R29.898 (ICD-10-CM) - Left arm weakness G54.0 (ICD-10-CM) - Thoracic outlet syndrome of left thoracic outlet   PERTINENT HISTORY: Per H&P from referring provider: Patient is a 62 year old female complaining of left arm weakness. Patient states that she has had pain for 7 weeks, tingling down her arm , pain under shoulder blade and armpit,intermittent pain in the arm down to the hand   PRECAUTIONS/RESTRICTIONS:   none  SUBJECTIVE:  Pt reports that she is having slightly less n/t recently, "it's not constant."  She feel the n/t most when she is reaching forward.  She is having n/t 30 - 40% of the time.  PAIN:  Are you having pain? Yes: NPRS scale: 0 current, 9 at worst/10 Pain location: under arm pit and into arm Pain description: shooting Aggravating factors: not sure-- feels it comes randomly Relieving factors: Rest  OBJECTIVE: (objective measures completed at initial  evaluation unless otherwise dated)  DIAGNOSTIC FINDINGS:  None on file   PATIENT SURVEYS:  FOTO 78; predicted 80   COGNITION: Overall cognitive status: Within functional limits for tasks assessed   SENSATION: Constant N/T throughout L UE into all fingers   POSTURE: rounded shoulders, forward head, and increased thoracic kyphosis   PALPATION: TTP L UT, pec major, pec minor, medial border of scapula; no overt anterior scalene or first rib tightness/hypomobility   CERVICAL ROM:    Active ROM A/PROM (deg) eval  Flexion WNL pulls in L shoulder blade  Extension WNL  Right lateral flexion 25  Left lateral flexion 25  Right rotation 40  Left rotation 50   (Blank rows = not tested)   UPPER EXTREMITY ROM: Grossly WFL    UPPER EXTREMITY MMT:   MMT Right eval Left eval  Shoulder flexion 4+ 4+  Shoulder extension 4+ 4  Shoulder abduction 4+ 4+  Shoulder adduction      Shoulder internal rotation  5 5  Shoulder external rotation 4 4  Middle trapezius 4- 3+  Lower trapezius      Elbow flexion 5 5  Elbow extension 5 5  Wrist flexion      Wrist extension 4 4+ pain  Wrist ulnar deviation      Wrist radial deviation      Wrist pronation      Wrist supination      Grip strength 55 lbs 40 lbs   (Blank rows = not tested)   CERVICAL SPECIAL TESTS:  Did not assess Roo's test (+) for increased symptoms on L UE neural tension testing (+) on L Tinel test (+) on L    FUNCTIONAL TESTS:  Did not assess   TODAY'S TREATMENT:                                                                                                                              DATE: 03/13/23 Therapeutic exercise - See HEP    PATIENT EDUCATION:  Education details: Exam findings, POC, initial HEP, TPDN Person educated: Patient Education method: Explanation, Demonstration, and Handouts Education comprehension: verbalized understanding, returned demonstration, and needs further education   HOME EXERCISE  PROGRAM: Access Code: [email or web address] URL: [email or web address] Date: 03/13/2023 Prepared by: Vernon Prey April Kirstie Peri   Exercises - Seated Scapular Retraction  - 1 x daily - 7 x weekly - 2 sets - 10 reps - Supine Shoulder External Rotation in Abduction  - 1 x daily - 7 x weekly - 2 sets - 10 reps - Shoulder External Rotation in 45 Degrees Abduction  - 1 x daily - 7 x weekly - 3 sets - 10 reps - Corner Pec Minor Stretch  - 1 x daily - 7 x weekly - 2 sets - 30 sec hold - Doorway Pec Stretch at 60 Elevation  - 1 x daily - 7 x weekly - 3 sets - 10 reps   Patient Education - Trigger Point Dry Needling  TREATMENT 4/26:  Therapeutic Exercise: - UBE 2.5'/2.5' fwd and backward for warm up while taking subjective - Black TB row with scap retraction - 3x10 - GTB shoulder ext - 3x10 - ER/IR YTB/Blue TB - 2x10 ea  Manual therapy -STM & TPR pec minor/major and UT - First rib mobilization -Skilled assessment and palpation for TPDN  Trigger Point Dry-Needling  Treatment instructions: Expect mild to moderate muscle soreness. S/S of pneumothorax if dry needled over a lung field, and to seek immediate medical attention should they occur. Patient verbalized understanding of these instructions and education.   Patient Consent Given: Yes Education handout provided: Yes Muscles treated: L Pec major, pec minor, L UT Electrical stimulation performed: No Parameters: N/A Treatment response/outcome: Improved muscle extensibility   ASSESSMENT:   CLINICAL IMPRESSION: Brielyn tolerated session well with no adverse reaction.  Her n/t sxs are improving significantly with current exercises, so we will continue with these.  She may have some element of  r/c pathology with increased pain with resisted ER.  This is minor and will likely improve with resistance exercises and stretching.   OBJECTIVE IMPAIRMENTS: decreased activity tolerance, decreased strength, increased fascial restrictions, impaired  sensation, impaired UE functional use, improper body mechanics, postural dysfunction, and pain.    ACTIVITY LIMITATIONS: carrying, lifting, and caring for others   PARTICIPATION LIMITATIONS: meal prep, cleaning, driving, and yard work   PERSONAL FACTORS: Past/current experiences and Time since onset of injury/illness/exacerbation are also affecting patient's functional outcome.    REHAB POTENTIAL: Good   CLINICAL DECISION MAKING: Stable/uncomplicated   EVALUATION COMPLEXITY: Low     GOALS: Goals reviewed with patient? Yes   SHORT TERM GOALS: Target date: 04/03/2023   Pt will be ind with initial HEP Baseline:  Goal status: INITIAL   2.  Pt will be ind with maintaining shoulders down and back to decrease pec tightness Baseline:  Goal status: INITIAL     LONG TERM GOALS: Target date: 04/24/2023   Pt will be management and progression of HEP Baseline:  Goal status: INITIAL   2.  Pt will report decrease in N/T by >/=50% Baseline: Rates it 5/10 Goal status: INITIAL   3.  Pt will report no instances of pain/pulling in arm pit or shoulder blade to demo improved muscular imbalances Baseline:  Goal status: INITIAL   4.  Pt will be able to hold grandchild x 10 min with proper shoulder mechanics to demo increased functional shoulder strength/endurance Baseline:  Goal status: INITIAL     PLAN:   PT FREQUENCY: 1x/week   PT DURATION: 6 weeks   PLANNED INTERVENTIONS: Therapeutic exercises, Therapeutic activity, Neuromuscular re-education, Balance training, Gait training, Patient/Family education, Self Care, Joint mobilization, Dry Needling, Electrical stimulation, Cryotherapy, Moist heat, Taping, Ionotophoresis 4mg /ml Dexamethasone, Manual therapy, and Re-evaluation   PLAN FOR NEXT SESSION: Assess response to HEP/TPDN. Consider more TPDN and manual work. Continue posterior shoulder girdle strengthening. Consider first rib mobilizations. Initiate nerve glides.      Kimberlee Nearing  Luiscarlos Kaczmarczyk PT 03/22/2023, 10:44 AM

## 2023-03-25 NOTE — Progress Notes (Signed)
    Latasha Hicks D.[name] [address] [place] [address] Phone: [phone]   Assessment and Plan:     There are no diagnoses linked to this encounter.  ***   Pertinent previous records reviewed include ***   Follow Up: ***     Subjective:   I, Latasha Hicks, am serving as a Neurosurgeon for Doctor Richardean Sale   Chief Complaint: left arm weakness    HPI:    02/26/23 Patient is a 62 year old female complaining of left arm weakness. Patient states that she has had pain for 7 weeks, tingling down her arm , pain under shoulder blade and armpit,intermittent pain in the arm down to the hand , tylenol and ibu for the pain muscle relaxer didn't help as well, decreased ROM , no MOI, no pain radiating up he neck, today is the firs day it felt okay,   03/26/2023 Patient states    Relevant Historical Information: None pertinent  Additional pertinent review of systems negative.   Current Outpatient Medications:    allopurinol (ZYLOPRIM) 100 MG tablet, Take 1 tablet (100 mg total) by mouth daily., Disp: 90 tablet, Rfl: 3   b complex vitamins tablet, Take 1 tablet by mouth daily., Disp: 100 tablet, Rfl: 3   Cholecalciferol (VITAMIN D3) 50 MCG (2000 UT) capsule, Take 1 capsule (2,000 Units total) by mouth daily., Disp: 100 capsule, Rfl: 3   Cyanocobalamin (VITAMIN B-12) 1000 MCG SUBL, Place 1 tablet (1,000 mcg total) under the tongue daily., Disp: 100 tablet, Rfl: 3   cyclobenzaprine (FLEXERIL) 5 MG tablet, Take 1 tablet (5 mg total) by mouth at bedtime., Disp: 30 tablet, Rfl: 0   famotidine (PEPCID) 40 MG tablet, Take 1 tablet (40 mg total) by mouth daily., Disp: 90 tablet, Rfl: 3   furosemide (LASIX) 20 MG tablet, TAKE 1 TABLET BY MOUTH EVERY DAY AS NEEDED, Disp: 90 tablet, Rfl: 1   meloxicam (MOBIC) 15 MG tablet, Take 1 tablet (15 mg total) by mouth daily., Disp: 30 tablet, Rfl: 0   potassium citrate (UROCIT-K) 5 MEQ (540 MG) SR tablet,  Take 1 tablet (5 mEq total) by mouth 3 (three) times daily with meals., Disp: 90 tablet, Rfl: 11   Probiotic Product (PROBIOTIC-10 PO), Take by mouth., Disp: , Rfl:    Semaglutide-Weight Management (WEGOVY) 1 MG/0.5ML SOAJ, Inject 1 mg into the skin once a week., Disp: 2 mL, Rfl: 3   Objective:     There were no vitals filed for this visit.    There is no height or weight on file to calculate BMI.    Physical Exam:    ***   Electronically signed by:  Latasha Hicks [name] 12:00 PM 03/25/23

## 2023-03-26 ENCOUNTER — Ambulatory Visit: Payer: 59 | Admitting: Sports Medicine

## 2023-03-26 ENCOUNTER — Ambulatory Visit (INDEPENDENT_AMBULATORY_CARE_PROVIDER_SITE_OTHER): Payer: 59

## 2023-03-26 VITALS — HR 71 | Ht 65.0 in | Wt 263.0 lb

## 2023-03-26 DIAGNOSIS — M7661 Achilles tendinitis, right leg: Secondary | ICD-10-CM | POA: Diagnosis not present

## 2023-03-26 DIAGNOSIS — M542 Cervicalgia: Secondary | ICD-10-CM

## 2023-03-26 DIAGNOSIS — G8929 Other chronic pain: Secondary | ICD-10-CM

## 2023-03-26 DIAGNOSIS — M79671 Pain in right foot: Secondary | ICD-10-CM | POA: Diagnosis not present

## 2023-03-26 NOTE — Patient Instructions (Addendum)
Good to see you  Recommend using boot during the day  PT referral  Ankle HEP  Xrays on the way out Heating pad over the calf and voltaren gel over areas of pain

## 2023-03-28 ENCOUNTER — Ambulatory Visit: Payer: 59 | Attending: Sports Medicine | Admitting: Physical Therapy

## 2023-03-28 ENCOUNTER — Encounter: Payer: Self-pay | Admitting: Physical Therapy

## 2023-03-28 DIAGNOSIS — R208 Other disturbances of skin sensation: Secondary | ICD-10-CM

## 2023-03-28 DIAGNOSIS — R29898 Other symptoms and signs involving the musculoskeletal system: Secondary | ICD-10-CM | POA: Diagnosis present

## 2023-03-28 DIAGNOSIS — M7661 Achilles tendinitis, right leg: Secondary | ICD-10-CM | POA: Diagnosis not present

## 2023-03-28 DIAGNOSIS — M25571 Pain in right ankle and joints of right foot: Secondary | ICD-10-CM

## 2023-03-28 DIAGNOSIS — M6281 Muscle weakness (generalized): Secondary | ICD-10-CM

## 2023-03-28 DIAGNOSIS — G540 Brachial plexus disorders: Secondary | ICD-10-CM | POA: Diagnosis present

## 2023-03-28 NOTE — Therapy (Signed)
OUTPATIENT PHYSICAL THERAPY TREATMENT NOTE   Patient Name: Latasha Hicks MRN: [id number] DOB:1961/10/13, 62 y.o., female Today's Date: 03/28/2023  PCP: Jacinta Shoe, MD   REFERRING PROVIDER: Richardean Sale, DO   PT End of Session - 03/28/23 [phone]     Visit Number 3    Number of Visits 6    Date for PT Re-Evaluation 04/24/23    Authorization Type [employer]    Progress Note Due on Visit 10    PT Start Time 0915    PT Stop Time 0956    PT Time Calculation (min) 41 min    Activity Tolerance Patient tolerated treatment well    Behavior During Therapy [facility] for tasks assessed/performed             Past Medical History:  Diagnosis Date   Allergic rhinitis    Complication of anesthesia    "hard to wake up in past"   COVID-19 09/2021   Endometriosis    Hematuria    d/t kidney stones    History of kidney stones    History of sleep apnea    NO OSA SINCE WEIGHT LOSS SURGERY AND WEIGHT LOSS 2009   Hydronephrosis    Kidney stones    Osteoarthritis    Personal history of colonic polyps    Question accuracy, patient report only.   Sleep apnea    Urinary incontinence    VENOUS INSUFFICIENCY, CHRONIC 07/05/2007   Annotation: LE Qualifier: Diagnosis of  By: Dance CMA (AAMA), Kim     Vitamin D deficiency    Past Surgical History:  Procedure Laterality Date   ABDOMINAL SURGERY     ACHILLES TENDON SURGERY Left 12/2017   BREAST CYST EXCISION Left    COLONOSCOPY N/A 09/04/2013   Procedure: COLONOSCOPY;  Surgeon: Louis Meckel, MD;  Location: [facility] ENDOSCOPY;  Service: Endoscopy;  Laterality: N/A;   COLONOSCOPY W/ POLYPECTOMY     CYSTOSCOPY WITH RETROGRADE PYELOGRAM, URETEROSCOPY AND STENT PLACEMENT Left 01/30/2019   Procedure: CYSTOSCOPY WITH RETROGRADE PYELOGRAM, URETEROSCOPY AND STENT PLACEMENT;  Surgeon: Malen Gauze, MD;  Location: [facility] ORS;  Service: Urology;  Laterality: Left;  1 HR   CYSTOSCOPY WITH RETROGRADE PYELOGRAM, URETEROSCOPY AND STENT PLACEMENT  Bilateral 04/19/2020   Procedure: CYSTOSCOPY WITH RETROGRADE PYELOGRAM, URETEROSCOPY AND STENT PLACEMENT;  Surgeon: Malen Gauze, MD;  Location: [facility];  Service: Urology;  Laterality: Bilateral;  2 HRS   CYSTOSCOPY WITH RETROGRADE PYELOGRAM, URETEROSCOPY AND STENT PLACEMENT N/A 05/17/2020   Procedure: CYSTOSCOPY WITH RIGHT RETROGRADE PYELOGRAM, INTRAOPERATIVE FLUOROSCOPY, under one hour, with INTERPRETATION, RIGHT DIAGNOSTIC URETEROSCOPY AND BILATERAL URETERAL STENT REMOVAL;  Surgeon: Malen Gauze, MD;  Location: [facility];  Service: Urology;  Laterality: N/A;  30 MINS   DILATION AND CURETTAGE OF UTERUS     HIATAL HERNIA REPAIR     HOLMIUM LASER APPLICATION Left 01/30/2019   Procedure: HOLMIUM LASER APPLICATION;  Surgeon: Malen Gauze, MD;  Location: [facility];  Service: Urology;  Laterality: Left;   HOLMIUM LASER APPLICATION Bilateral 04/19/2020   Procedure: HOLMIUM LASER APPLICATION;  Surgeon: Malen Gauze, MD;  Location: [facility];  Service: Urology;  Laterality: Bilateral;   lap band replacement  2012   LAPAROSCOPIC CHOLECYSTECTOMY     LAPAROSCOPIC GASTRIC BANDING  2009   TOTAL KNEE ARTHROPLASTY Left 09/29/2018   Procedure: LEFT TOTAL KNEE ARTHROPLASTY;  Surgeon: Tarry Kos, MD;  Location: [facility] OR;  Service: Orthopedics;  Laterality: Left;  VAGINAL HYSTERECTOMY     partial   Patient Active Problem List   Diagnosis Date Noted   Rash 08/06/2022   Plantar fasciitis of right foot 05/24/2022   Leg edema 05/24/2022   COVID-19 10/02/2021   Acute cough 10/02/2021   Stye 05/23/2021   Nephrolithiasis 05/23/2021   Primary osteoarthritis of left knee 09/29/2018   Hx of total knee replacement, left 09/29/2018   Colon polyp 08/28/2018   Diarrhea 07/10/2018   Chronic pain of right knee 05/01/2018   Hematuria 12/24/2017   Left flank pain 12/24/2017   Skin lesion 04/24/2017   Symptomatic abdominal apron 04/15/2017    H/O laparoscopic adjustable gastric banding 04/15/2017   Well adult exam 10/02/2016   Salivary gland infection 06/01/2016   Paresthesia 06/10/2015   Left leg swelling 07/26/2014   GI bleed 09/03/2013   Urinary incontinence 08/21/2013   Internal hemorrhoids 08/21/2013   Unspecified constipation 06/12/2013   LBP (low back pain) 06/12/2013   History of colonic polyps 06/12/2013   Right LBP 05/06/2013   Perianal abscess 05/01/2013   GRIEF REACTION 03/26/2008   ALLERGIC RHINITIS 03/26/2008   OSTEOARTHRITIS 03/26/2008   Bariatric surgery status 03/26/2008   Vitamin D deficiency 07/05/2007   OBESITY, MORBID 07/05/2007   Obstructive sleep apnea 07/05/2007   VENOUS INSUFFICIENCY, CHRONIC 07/05/2007   Calculus of gallbladder 07/05/2007   HYDRONEPHROSIS 07/05/2007    THERAPY DIAG:  Thoracic outlet syndrome - Plan: PT plan of care cert/re-cert  Pain in right ankle and joints of right foot - Plan: PT plan of care cert/re-cert  Other disturbances of skin sensation - Plan: PT plan of care cert/re-cert  Other symptoms and signs involving the musculoskeletal system - Plan: PT plan of care cert/re-cert  Muscle weakness - Plan: PT plan of care cert/re-cert   Rationale for Evaluation and Treatment: Rehabilitation  REFERRING DIAG: M54.2 (ICD-10-CM) - Neck pain R29.898 (ICD-10-CM) - Left arm weakness G54.0 (ICD-10-CM) - Thoracic outlet syndrome of left thoracic outlet   PERTINENT HISTORY: Per H&P from referring provider: Patient is a 62 year old female complaining of left arm weakness. Patient states that she has had pain for 7 weeks, tingling down her arm , pain under shoulder blade and armpit,intermittent pain in the arm down to the hand   PRECAUTIONS/RESTRICTIONS:   none  SUBJECTIVE:  Pt reports history of R achilles tendon pain for roughly 4 months. She has a history of L achilles tendon pain with subsequent tendon lengthening which was helpful.  Pt saw ortho concerning achilles pain  and was told that surgery was a bad idea.  She is currently in a boot.  PAIN:   Pain:  Are you having pain? Yes Pain location: R achilles tendon NPRS scale:  highest 9/10 current 3/10  best 0/10 Aggravating factors: walking, standing Relieving factors: rest, boot Pain description: sharp and aching Severity: high Irritability: high Stage: Chronic Stability: staying the same 24 hour pattern:    Are you having pain? Yes: NPRS scale: 0 current, 9 at worst/10 Pain location: under arm pit and into arm Pain description: shooting Aggravating factors: not sure-- feels it comes randomly Relieving factors: Rest  OBJECTIVE: (objective measures completed at initial evaluation unless otherwise dated)  DIAGNOSTIC FINDINGS:  None on file   PATIENT SURVEYS:  FOTO 78; predicted 80   COGNITION: Overall cognitive status: Within functional limits for tasks assessed   SENSATION: Constant N/T throughout L UE into all fingers   POSTURE: rounded shoulders, forward head, and increased thoracic kyphosis  PALPATION: TTP L UT, pec major, pec minor, medial border of scapula; no overt anterior scalene or first rib tightness/hypomobility   LE ROM:  ROM Right 03/28/2023 Left 03/28/2023  Hip flexion    Hip extension    Hip abduction    Hip adduction    Hip internal rotation    Hip external rotation    Knee flexion    Knee extension    Ankle dorsiflexion 2 0  Ankle plantarflexion    Ankle inversion    Ankle eversion     (Blank rows = not tested, N = WNL, * = concordant pain with testing)  LE MMT:  MMT Right 03/28/2023 Left 03/28/2023  Hip flexion (L2, L3)    Knee extension (L3)    Knee flexion    Hip abduction    Hip extension    Hip external rotation    Hip internal rotation    Hip adduction    Ankle dorsiflexion (L4) 2+* 3+  Ankle plantarflexion (S1)    Ankle inversion    Ankle eversion    Great Toe ext (L5)    Grossly     (Blank rows = not tested, score listed is out of  5 possible points.  N = WNL, D = diminished, C = clear for gross weakness with myotome testing, * = concordant pain with testing)   CERVICAL ROM:    Active ROM A/PROM (deg) eval  Flexion WNL pulls in L shoulder blade  Extension WNL  Right lateral flexion 25  Left lateral flexion 25  Right rotation 40  Left rotation 50   (Blank rows = not tested)   UPPER EXTREMITY ROM: Grossly WFL    UPPER EXTREMITY MMT:   MMT Right eval Left eval  Shoulder flexion 4+ 4+  Shoulder extension 4+ 4  Shoulder abduction 4+ 4+  Shoulder adduction      Shoulder internal rotation 5 5  Shoulder external rotation 4 4  Middle trapezius 4- 3+  Lower trapezius      Elbow flexion 5 5  Elbow extension 5 5  Wrist flexion      Wrist extension 4 4+ pain  Wrist ulnar deviation      Wrist radial deviation      Wrist pronation      Wrist supination      Grip strength 55 lbs 40 lbs   (Blank rows = not tested)   CERVICAL SPECIAL TESTS:  Did not assess Roo's test (+) for increased symptoms on L UE neural tension testing (+) on L Tinel test (+) on L    FUNCTIONAL TESTS:  Did not assess    PATIENT EDUCATION:  Education details: Exam findings, POC, initial HEP, TPDN Person educated: Patient Education method: Explanation, Demonstration, and Handouts Education comprehension: verbalized understanding, returned demonstration, and needs further education   HOME EXERCISE PROGRAM: Access Code: [email or web address] URL: [email or web address] Date: 03/28/2023 Prepared by: Alphonzo Severance  Exercises - Seated Scapular Retraction  - 1 x daily - 7 x weekly - 2 sets - 10 reps - Supine Shoulder External Rotation in Abduction  - 1 x daily - 7 x weekly - 2 sets - 10 reps - Shoulder External Rotation in 45 Degrees Abduction  - 1 x daily - 7 x weekly - 3 sets - 10 reps - Corner Pec Minor Stretch  - 1 x daily - 7 x weekly - 2 sets - 30 sec hold - Doorway Pec Stretch at 60 Elevation  - 1 x daily -  7 x weekly - 3  sets - 10 reps - Long Sitting Plantar Fascia Stretch with Towel  - 3 x daily - 7 x weekly - 1 sets - 2 reps - 2 minutes hold - Ankle and Toe Plantarflexion with Resistance  - 1 x daily - 7 x weekly - 3 sets - 10 reps  TREATMENT 5/2:  Manual therapy - STM L UT - STM R gastroc -Skilled assessment and palpation for TPDN  Trigger Point Dry-Needling  Treatment instructions: Expect mild to moderate muscle soreness. S/S of pneumothorax if dry needled over a lung field, and to seek immediate medical attention should they occur. Patient verbalized understanding of these instructions and education.   Patient Consent Given: Yes Education handout provided: Yes Muscles treated: L UT, R gastroc/soleus Electrical stimulation performed: No Parameters: N/A Treatment response/outcome: Improved muscle extensibility   ASSESSMENT:   CLINICAL IMPRESSION: Pt presents to clinic signs and sxs consistent with R insertional achilles tendonitis.  Issued R heel lift to help offload tendon.  Updated HEP for gentle stretching and loading PF.   OBJECTIVE IMPAIRMENTS: decreased activity tolerance, decreased strength, increased fascial restrictions, impaired sensation, impaired UE functional use, improper body mechanics, postural dysfunction,  and pain   ACTIVITY LIMITATIONS: carrying, lifting, and caring for others   PARTICIPATION LIMITATIONS: meal prep, cleaning, driving, and yard work   PERSONAL FACTORS: Past/current experiences and Time since onset of injury/illness/exacerbation are also affecting patient's functional outcome.    REHAB POTENTIAL: Good   CLINICAL DECISION MAKING: Stable/uncomplicated   EVALUATION COMPLEXITY: Low     GOALS: Goals reviewed with patient? Yes   SHORT TERM GOALS: Target date: 04/03/2023   Pt will be ind with initial HEP Baseline:  Goal status: INITIAL   2.  Pt will be ind with maintaining shoulders down and back to decrease pec tightness Baseline:  Goal status:  INITIAL     LONG TERM GOALS: Target date: 04/24/2023   Pt will be management and progression of HEP Baseline:  Goal status: INITIAL   2.  Pt will report decrease in N/T by >/=50% Baseline: Rates it 5/10 Goal status: INITIAL   3.  Pt will report no instances of pain/pulling in arm pit or shoulder blade to demo improved muscular imbalances Baseline:  Goal status: INITIAL   4.  Pt will be able to hold grandchild x 10 min with proper shoulder mechanics to demo increased functional shoulder strength/endurance Baseline:  Goal status: INITIAL  5. Latasha Hicks will improve right ankle DF to 10 degrees  Evaluation/Baseline (03/28/2023): 2 degrees Goal status: INITIAL  6.  Latasha Hicks will self report >/= 50% decrease R achilles in pain from evaluation   Evaluation/Baseline (03/28/2023): 9/10 max pain Goal status: INITIAL     PLAN:   PT FREQUENCY: 1x/week   PT DURATION: 6 weeks   PLANNED INTERVENTIONS: Therapeutic exercises, Therapeutic activity, Neuromuscular re-education, Balance training, Gait training, Patient/Family education, Self Care, Joint mobilization, Dry Needling, Electrical stimulation, Cryotherapy, Moist heat, Taping, Ionotophoresis 4mg /ml Dexamethasone, Manual therapy, and Re-evaluation   PLAN FOR NEXT SESSION: Assess response to HEP/TPDN. Consider more TPDN and manual work. Continue posterior shoulder girdle strengthening. Consider first rib mobilizations. Initiate nerve glides.      Kimberlee Nearing Ned Kakar PT 03/28/2023, 10:41 AM

## 2023-04-03 ENCOUNTER — Encounter: Payer: Self-pay | Admitting: Physical Therapy

## 2023-04-03 ENCOUNTER — Ambulatory Visit: Payer: 59 | Admitting: Physical Therapy

## 2023-04-03 DIAGNOSIS — R29898 Other symptoms and signs involving the musculoskeletal system: Secondary | ICD-10-CM

## 2023-04-03 DIAGNOSIS — G540 Brachial plexus disorders: Secondary | ICD-10-CM | POA: Diagnosis not present

## 2023-04-03 DIAGNOSIS — R208 Other disturbances of skin sensation: Secondary | ICD-10-CM

## 2023-04-03 DIAGNOSIS — M6281 Muscle weakness (generalized): Secondary | ICD-10-CM

## 2023-04-03 NOTE — Therapy (Signed)
OUTPATIENT PHYSICAL THERAPY TREATMENT NOTE   Patient Name: Latasha Hicks MRN: [id number] DOB:01-Oct-1961, 62 y.o., female Today's Date: 04/03/2023  PCP: Jacinta Shoe, MD   REFERRING PROVIDER: Richardean Sale, DO   PT End of Session - 04/03/23 [phone]     Visit Number 4    Number of Visits 6    Date for PT Re-Evaluation 04/24/23    Authorization Type [employer] Medicare    PT Start Time 0929    PT Stop Time 1016    PT Time Calculation (min) 47 min    Activity Tolerance Patient tolerated treatment well    Behavior During Therapy [facility] for tasks assessed/performed              Past Medical History:  Diagnosis Date   Allergic rhinitis    Complication of anesthesia    "hard to wake up in past"   COVID-19 09/2021   Endometriosis    Hematuria    d/t kidney stones    History of kidney stones    History of sleep apnea    NO OSA SINCE WEIGHT LOSS SURGERY AND WEIGHT LOSS 2009   Hydronephrosis    Kidney stones    Osteoarthritis    Personal history of colonic polyps    Question accuracy, patient report only.   Sleep apnea    Urinary incontinence    VENOUS INSUFFICIENCY, CHRONIC 07/05/2007   Annotation: LE Qualifier: Diagnosis of  By: Dance CMA (AAMA), Kim     Vitamin D deficiency    Past Surgical History:  Procedure Laterality Date   ABDOMINAL SURGERY     ACHILLES TENDON SURGERY Left 12/2017   BREAST CYST EXCISION Left    COLONOSCOPY N/A 09/04/2013   Procedure: COLONOSCOPY;  Surgeon: Louis Meckel, MD;  Location: [facility] ENDOSCOPY;  Service: Endoscopy;  Laterality: N/A;   COLONOSCOPY W/ POLYPECTOMY     CYSTOSCOPY WITH RETROGRADE PYELOGRAM, URETEROSCOPY AND STENT PLACEMENT Left 01/30/2019   Procedure: CYSTOSCOPY WITH RETROGRADE PYELOGRAM, URETEROSCOPY AND STENT PLACEMENT;  Surgeon: Malen Gauze, MD;  Location: [facility] ORS;  Service: Urology;  Laterality: Left;  1 HR   CYSTOSCOPY WITH RETROGRADE PYELOGRAM, URETEROSCOPY AND STENT PLACEMENT Bilateral 04/19/2020   Procedure:  CYSTOSCOPY WITH RETROGRADE PYELOGRAM, URETEROSCOPY AND STENT PLACEMENT;  Surgeon: Malen Gauze, MD;  Location: [facility];  Service: Urology;  Laterality: Bilateral;  2 HRS   CYSTOSCOPY WITH RETROGRADE PYELOGRAM, URETEROSCOPY AND STENT PLACEMENT N/A 05/17/2020   Procedure: CYSTOSCOPY WITH RIGHT RETROGRADE PYELOGRAM, INTRAOPERATIVE FLUOROSCOPY, under one hour, with INTERPRETATION, RIGHT DIAGNOSTIC URETEROSCOPY AND BILATERAL URETERAL STENT REMOVAL;  Surgeon: Malen Gauze, MD;  Location: [facility];  Service: Urology;  Laterality: N/A;  30 MINS   DILATION AND CURETTAGE OF UTERUS     HIATAL HERNIA REPAIR     HOLMIUM LASER APPLICATION Left 01/30/2019   Procedure: HOLMIUM LASER APPLICATION;  Surgeon: Malen Gauze, MD;  Location: [facility];  Service: Urology;  Laterality: Left;   HOLMIUM LASER APPLICATION Bilateral 04/19/2020   Procedure: HOLMIUM LASER APPLICATION;  Surgeon: Malen Gauze, MD;  Location: [facility];  Service: Urology;  Laterality: Bilateral;   lap band replacement  2012   LAPAROSCOPIC CHOLECYSTECTOMY     LAPAROSCOPIC GASTRIC BANDING  2009   TOTAL KNEE ARTHROPLASTY Left 09/29/2018   Procedure: LEFT TOTAL KNEE ARTHROPLASTY;  Surgeon: Tarry Kos, MD;  Location: [facility] OR;  Service: Orthopedics;  Laterality: Left;   VAGINAL HYSTERECTOMY     partial  Patient Active Problem List   Diagnosis Date Noted   Rash 08/06/2022   Plantar fasciitis of right foot 05/24/2022   Leg edema 05/24/2022   COVID-19 10/02/2021   Acute cough 10/02/2021   Stye 05/23/2021   Nephrolithiasis 05/23/2021   Primary osteoarthritis of left knee 09/29/2018   Hx of total knee replacement, left 09/29/2018   Colon polyp 08/28/2018   Diarrhea 07/10/2018   Chronic pain of right knee 05/01/2018   Hematuria 12/24/2017   Left flank pain 12/24/2017   Skin lesion 04/24/2017   Symptomatic abdominal apron 04/15/2017   H/O laparoscopic adjustable gastric  banding 04/15/2017   Well adult exam 10/02/2016   Salivary gland infection 06/01/2016   Paresthesia 06/10/2015   Left leg swelling 07/26/2014   GI bleed 09/03/2013   Urinary incontinence 08/21/2013   Internal hemorrhoids 08/21/2013   Unspecified constipation 06/12/2013   LBP (low back pain) 06/12/2013   History of colonic polyps 06/12/2013   Right LBP 05/06/2013   Perianal abscess 05/01/2013   GRIEF REACTION 03/26/2008   ALLERGIC RHINITIS 03/26/2008   OSTEOARTHRITIS 03/26/2008   Bariatric surgery status 03/26/2008   Vitamin D deficiency 07/05/2007   OBESITY, MORBID 07/05/2007   Obstructive sleep apnea 07/05/2007   VENOUS INSUFFICIENCY, CHRONIC 07/05/2007   Calculus of gallbladder 07/05/2007   HYDRONEPHROSIS 07/05/2007    THERAPY DIAG:  Other disturbances of skin sensation  Other symptoms and signs involving the musculoskeletal system  Muscle weakness   Rationale for Evaluation and Treatment: Rehabilitation  REFERRING DIAG: M54.2 (ICD-10-CM) - Neck pain R29.898 (ICD-10-CM) - Left arm weakness G54.0 (ICD-10-CM) - Thoracic outlet syndrome of left thoracic outlet   PERTINENT HISTORY: Per H&P from referring provider: Patient is a 62 year old female complaining of left arm weakness. Patient states that she has had pain for 7 weeks, tingling down her arm , pain under shoulder blade and armpit,intermittent pain in the arm down to the hand   PRECAUTIONS/RESTRICTIONS:   none  SUBJECTIVE:  "Last night my shoulder was giving me the most issue with N/T into my L fingers."  PAIN:   Pain:  Are you having pain? Yes Pain location: R achilles tendon NPRS scale:  highest 9/10 current 5/10  best 0/10 Aggravating factors: walking, standing Relieving factors: rest, boot Pain description: sharp and aching Severity: high Irritability: high Stage: Chronic Stability: staying the same 24 hour pattern:    Are you having pain? Yes: NPRS scale: 7/10 current, 9 at worst/10 Pain  location: under arm pit and into arm Pain description: shooting Aggravating factors: not sure-- feels it comes randomly Relieving factors: Rest  OBJECTIVE: (objective measures completed at initial evaluation unless otherwise dated)  DIAGNOSTIC FINDINGS:  None on file   PATIENT SURVEYS:  FOTO 78; predicted 80   COGNITION: Overall cognitive status: Within functional limits for tasks assessed   SENSATION: Constant N/T throughout L UE into all fingers   POSTURE: rounded shoulders, forward head, and increased thoracic kyphosis   PALPATION: TTP L UT, pec major, pec minor, medial border of scapula; no overt anterior scalene or first rib tightness/hypomobility   LE ROM:  ROM Right 03/28/2023 Left 03/28/2023  Hip flexion    Hip extension    Hip abduction    Hip adduction    Hip internal rotation    Hip external rotation    Knee flexion    Knee extension    Ankle dorsiflexion 2 0  Ankle plantarflexion    Ankle inversion    Ankle eversion     (  Blank rows = not tested, N = WNL, * = concordant pain with testing)  LE MMT:  MMT Right 03/28/2023 Left 03/28/2023  Hip flexion (L2, L3)    Knee extension (L3)    Knee flexion    Hip abduction    Hip extension    Hip external rotation    Hip internal rotation    Hip adduction    Ankle dorsiflexion (L4) 2+* 3+  Ankle plantarflexion (S1)    Ankle inversion    Ankle eversion    Great Toe ext (L5)    Grossly     (Blank rows = not tested, score listed is out of 5 possible points.  N = WNL, D = diminished, C = clear for gross weakness with myotome testing, * = concordant pain with testing)   CERVICAL ROM:    Active ROM A/PROM (deg) eval  Flexion WNL pulls in L shoulder blade  Extension WNL  Right lateral flexion 25  Left lateral flexion 25  Right rotation 40  Left rotation 50   (Blank rows = not tested)   UPPER EXTREMITY ROM: Grossly WFL    UPPER EXTREMITY MMT:   MMT Right eval Left eval  Shoulder flexion 4+ 4+   Shoulder extension 4+ 4  Shoulder abduction 4+ 4+  Shoulder adduction      Shoulder internal rotation 5 5  Shoulder external rotation 4 4  Middle trapezius 4- 3+  Lower trapezius      Elbow flexion 5 5  Elbow extension 5 5  Wrist flexion      Wrist extension 4 4+ pain  Wrist ulnar deviation      Wrist radial deviation      Wrist pronation      Wrist supination      Grip strength 55 lbs 40 lbs   (Blank rows = not tested)   CERVICAL SPECIAL TESTS:  Did not assess Roo's test (+) for increased symptoms on L UE neural tension testing (+) on L Tinel test (+) on L    FUNCTIONAL TESTS:  Did not assess    PATIENT EDUCATION:  Education details: Exam findings, POC, initial HEP, TPDN Person educated: Patient Education method: Explanation, Demonstration, and Handouts Education comprehension: verbalized understanding, returned demonstration, and needs further education   HOME EXERCISE PROGRAM: Access Code: [email or web address] URL: [email or web address] Date: 04/03/2023 Prepared by: Lulu Riding  Exercises - Seated Scapular Retraction  - 1 x daily - 7 x weekly - 2 sets - 10 reps - Supine Shoulder External Rotation in Abduction  - 1 x daily - 7 x weekly - 2 sets - 10 reps - Shoulder External Rotation in 45 Degrees Abduction  - 1 x daily - 7 x weekly - 3 sets - 10 reps - Corner Pec Minor Stretch  - 1 x daily - 7 x weekly - 2 sets - 30 sec hold - Doorway Pec Stretch at 60 Elevation  - 1 x daily - 7 x weekly - 3 sets - 10 reps - Long Sitting Plantar Fascia Stretch with Towel  - 3 x daily - 7 x weekly - 1 sets - 2 reps - 2 minutes hold - Ankle and Toe Plantarflexion with Resistance  - 1 x daily - 7 x weekly - 3 sets - 10 reps - Seated Rhomboid Stretch  - 3 x daily - 7 x weekly - 1-2 sets - 2 reps - 30  hold - Scapular retraction with ER (MONEY)  - 1 x daily -  7 x weekly - 2 sets - 12 reps  [facility] Adult PT Treatment:                                                DATE:  04/03/23 Therapeutic Exercise: Rhomboid stretch 2 x 30  Double ER with scapular retraction 2 x 12 with RTB L upper trap stretch 2 x 30 sec Seated chin tuck 1 x 10 holding 5 seconds  Manual Therapy: MTPR along the pec minor and rhomboid/ middle trap Taught how to perform with with theracane and reviewed benefits L first rib mobs grade III  Trigger Point Dry-Needling  Treatment instructions: Expect mild to moderate muscle soreness. S/S of pneumothorax if dry needled over a lung field, and to seek immediate medical attention should they occur. Patient verbalized understanding of these instructions and education.  Patient Consent Given: Yes Education handout provided: Previously provided Muscles treated: L scalenes Electrical stimulation performed: No Parameters: N/A Treatment response/outcome: Twitch response   TREATMENT 5/2:  Manual therapy - STM L UT - STM R gastroc -Skilled assessment and palpation for TPDN  Trigger Point Dry-Needling  Treatment instructions: Expect mild to moderate muscle soreness. S/S of pneumothorax if dry needled over a lung field, and to seek immediate medical attention should they occur. Patient verbalized understanding of these instructions and education.   Patient Consent Given: Yes Education handout provided: Yes Muscles treated: L UT, R gastroc/soleus Electrical stimulation performed: No Parameters: N/A Treatment response/outcome: Improved muscle extensibility   ASSESSMENT:   CLINICAL IMPRESSION: Pt arrives to session reporting increased issue in the L shoulder/ LUE requesting to focus on that today. TPDN was performed at the scalenes followed with first rib mobs and MTPR on the pec minor. Continued working on posterior chain activation and stretching. She reported improvement in symptoms in the LUE.    OBJECTIVE IMPAIRMENTS: decreased activity tolerance, decreased strength, increased fascial restrictions, impaired sensation, impaired UE  functional use, improper body mechanics, postural dysfunction,  and pain   ACTIVITY LIMITATIONS: carrying, lifting, and caring for others   PARTICIPATION LIMITATIONS: meal prep, cleaning, driving, and yard work   PERSONAL FACTORS: Past/current experiences and Time since onset of injury/illness/exacerbation are also affecting patient's functional outcome.    REHAB POTENTIAL: Good   CLINICAL DECISION MAKING: Stable/uncomplicated   EVALUATION COMPLEXITY: Low     GOALS: Goals reviewed with patient? Yes   SHORT TERM GOALS: Target date: 04/03/2023   Pt will be ind with initial HEP Baseline:  Goal status: MET 04/03/2023   2.  Pt will be ind with maintaining shoulders down and back to decrease pec tightness Baseline:  Goal status: MET  04/03/2023     LONG TERM GOALS: Target date: 04/24/2023   Pt will be management and progression of HEP Baseline:  Goal status: INITIAL   2.  Pt will report decrease in N/T by >/=50% Baseline: Rates it 5/10 Goal status: INITIAL   3.  Pt will report no instances of pain/pulling in arm pit or shoulder blade to demo improved muscular imbalances Baseline:  Goal status: INITIAL   4.  Pt will be able to hold grandchild x 10 min with proper shoulder mechanics to demo increased functional shoulder strength/endurance Baseline:  Goal status: INITIAL  5. Cameshia will improve right ankle DF to 10 degrees  Evaluation/Baseline (03/28/2023): 2 degrees Goal status: INITIAL  6.  Rondia will self report >/= 50% decrease R achilles in pain from evaluation   Evaluation/Baseline (03/28/2023): 9/10 max pain Goal status: INITIAL     PLAN:   PT FREQUENCY: 1x/week   PT DURATION: 6 weeks   PLANNED INTERVENTIONS: Therapeutic exercises, Therapeutic activity, Neuromuscular re-education, Balance training, Gait training, Patient/Family education, Self Care, Joint mobilization, Dry Needling, Electrical stimulation, Cryotherapy, Moist heat, Taping, Ionotophoresis 4mg /ml  Dexamethasone, Manual therapy, and Re-evaluation   PLAN FOR NEXT SESSION: Assess response to HEP/TPDN. Consider more TPDN and manual work. Continue posterior shoulder girdle strengthening. Consider first rib mobilizations. Initiate nerve glides.      Olga Seyler PT, DPT, LAT, ATC  04/03/23  11:46 AM

## 2023-04-10 ENCOUNTER — Encounter: Payer: Self-pay | Admitting: Physical Therapy

## 2023-04-10 ENCOUNTER — Ambulatory Visit: Payer: 59 | Admitting: Physical Therapy

## 2023-04-10 DIAGNOSIS — R29898 Other symptoms and signs involving the musculoskeletal system: Secondary | ICD-10-CM

## 2023-04-10 DIAGNOSIS — M25571 Pain in right ankle and joints of right foot: Secondary | ICD-10-CM

## 2023-04-10 DIAGNOSIS — G540 Brachial plexus disorders: Secondary | ICD-10-CM | POA: Diagnosis not present

## 2023-04-10 DIAGNOSIS — M6281 Muscle weakness (generalized): Secondary | ICD-10-CM

## 2023-04-10 NOTE — Therapy (Signed)
OUTPATIENT PHYSICAL THERAPY TREATMENT NOTE   Patient Name: Latasha Hicks MRN: [id number] DOB:[birth year], 62 y.o., female Today's Date: 04/10/2023  PCP: Jacinta Shoe, MD   REFERRING PROVIDER: Richardean Sale, DO   PT End of Session - 04/10/23 1056     Visit Number 5    Number of Visits 6    Date for PT Re-Evaluation 04/24/23    Authorization Type [employer]    Progress Note Due on Visit 10    PT Start Time 1057    PT Stop Time 1142    PT Time Calculation (min) 45 min    Activity Tolerance Patient tolerated treatment well    Behavior During Therapy WFL for tasks assessed/performed               Past Medical History:  Diagnosis Date   Allergic rhinitis    Complication of anesthesia    "hard to wake up in past"   COVID-19 09/2021   Endometriosis    Hematuria    d/t kidney stones    History of kidney stones    History of sleep apnea    NO OSA SINCE WEIGHT LOSS SURGERY AND WEIGHT LOSS 2009   Hydronephrosis    Kidney stones    Osteoarthritis    Personal history of colonic polyps    Question accuracy, patient report only.   Sleep apnea    Urinary incontinence    VENOUS INSUFFICIENCY, CHRONIC 07/05/2007   Annotation: LE Qualifier: Diagnosis of  By: Dance CMA (AAMA), Kim     Vitamin D deficiency    Past Surgical History:  Procedure Laterality Date   ABDOMINAL SURGERY     ACHILLES TENDON SURGERY Left 12/2017   BREAST CYST EXCISION Left    COLONOSCOPY N/A 09/04/2013   Procedure: COLONOSCOPY;  Surgeon: Louis Meckel, MD;  Location: [facility] ENDOSCOPY;  Service: Endoscopy;  Laterality: N/A;   COLONOSCOPY W/ POLYPECTOMY     CYSTOSCOPY WITH RETROGRADE PYELOGRAM, URETEROSCOPY AND STENT PLACEMENT Left 01/30/2019   Procedure: CYSTOSCOPY WITH RETROGRADE PYELOGRAM, URETEROSCOPY AND STENT PLACEMENT;  Surgeon: Malen Gauze, MD;  Location: [facility];  Service: Urology;  Laterality: Left;  1 HR   CYSTOSCOPY WITH RETROGRADE PYELOGRAM, URETEROSCOPY AND STENT  PLACEMENT Bilateral 04/19/2020   Procedure: CYSTOSCOPY WITH RETROGRADE PYELOGRAM, URETEROSCOPY AND STENT PLACEMENT;  Surgeon: Malen Gauze, MD;  Location: [facility];  Service: Urology;  Laterality: Bilateral;  2 HRS   CYSTOSCOPY WITH RETROGRADE PYELOGRAM, URETEROSCOPY AND STENT PLACEMENT N/A 05/17/2020   Procedure: CYSTOSCOPY WITH RIGHT RETROGRADE PYELOGRAM, INTRAOPERATIVE FLUOROSCOPY, under one hour, with INTERPRETATION, RIGHT DIAGNOSTIC URETEROSCOPY AND BILATERAL URETERAL STENT REMOVAL;  Surgeon: Malen Gauze, MD;  Location: [facility];  Service: Urology;  Laterality: N/A;  30 MINS   DILATION AND CURETTAGE OF UTERUS     HIATAL HERNIA REPAIR     HOLMIUM LASER APPLICATION Left 01/30/2019   Procedure: HOLMIUM LASER APPLICATION;  Surgeon: Malen Gauze, MD;  Location: [facility];  Service: Urology;  Laterality: Left;   HOLMIUM LASER APPLICATION Bilateral 04/19/2020   Procedure: HOLMIUM LASER APPLICATION;  Surgeon: Malen Gauze, MD;  Location: [facility];  Service: Urology;  Laterality: Bilateral;   lap band replacement  2012   LAPAROSCOPIC CHOLECYSTECTOMY     LAPAROSCOPIC GASTRIC BANDING  2009   TOTAL KNEE ARTHROPLASTY Left 09/29/2018   Procedure: LEFT TOTAL KNEE ARTHROPLASTY;  Surgeon: Tarry Kos, MD;  Location: [facility] OR;  Service: Orthopedics;  Laterality: Left;  VAGINAL HYSTERECTOMY     partial   Patient Active Problem List   Diagnosis Date Noted   Rash 08/06/2022   Plantar fasciitis of right foot 05/24/2022   Leg edema 05/24/2022   COVID-19 10/02/2021   Acute cough 10/02/2021   Stye 05/23/2021   Nephrolithiasis 05/23/2021   Primary osteoarthritis of left knee 09/29/2018   Hx of total knee replacement, left 09/29/2018   Colon polyp 08/28/2018   Diarrhea 07/10/2018   Chronic pain of right knee 05/01/2018   Hematuria 12/24/2017   Left flank pain 12/24/2017   Skin lesion 04/24/2017   Symptomatic abdominal apron  04/15/2017   H/O laparoscopic adjustable gastric banding 04/15/2017   Well adult exam 10/02/2016   Salivary gland infection 06/01/2016   Paresthesia 06/10/2015   Left leg swelling 07/26/2014   GI bleed 09/03/2013   Urinary incontinence 08/21/2013   Internal hemorrhoids 08/21/2013   Unspecified constipation 06/12/2013   LBP (low back pain) 06/12/2013   History of colonic polyps 06/12/2013   Right LBP 05/06/2013   Perianal abscess 05/01/2013   GRIEF REACTION 03/26/2008   ALLERGIC RHINITIS 03/26/2008   OSTEOARTHRITIS 03/26/2008   Bariatric surgery status 03/26/2008   Vitamin D deficiency 07/05/2007   OBESITY, MORBID 07/05/2007   Obstructive sleep apnea 07/05/2007   VENOUS INSUFFICIENCY, CHRONIC 07/05/2007   Calculus of gallbladder 07/05/2007   HYDRONEPHROSIS 07/05/2007    THERAPY DIAG:  Muscle weakness  Pain in right ankle and joints of right foot  Other symptoms and signs involving the musculoskeletal system   Rationale for Evaluation and Treatment: Rehabilitation  REFERRING DIAG: M54.2 (ICD-10-CM) - Neck pain R29.898 (ICD-10-CM) - Left arm weakness G54.0 (ICD-10-CM) - Thoracic outlet syndrome of left thoracic outlet   PERTINENT HISTORY: Per H&P from referring provider: Patient is a 62 year old female complaining of left arm weakness. Patient states that she has had pain for 7 weeks, tingling down her arm , pain under shoulder blade and armpit,intermittent pain in the arm down to the hand   PRECAUTIONS/RESTRICTIONS:   none  SUBJECTIVE:  "My ankle is feeling better, and I haven't been wearing my boot. My L shoulder has been hurting which happens when I am driving."  PAIN:   Pain:  Are you having pain? Yes Pain location: R achilles tendon NPRS scale:  highest 9/10 current 1/10 best 0/10 Aggravating factors: walking, standing Relieving factors: rest, boot Pain description: sharp and aching Severity: high Irritability: high Stage: Chronic Stability: staying the  same 24 hour pattern:    Are you having pain? Yes: NPRS scale: 5/10 current, 9 at worst/10 Pain location: under arm pit and into arm Pain description: shooting Aggravating factors: not sure-- feels it comes randomly Relieving factors: Rest  OBJECTIVE: (objective measures completed at initial evaluation unless otherwise dated)  DIAGNOSTIC FINDINGS:  None on file   PATIENT SURVEYS:  FOTO 78; predicted 80   COGNITION: Overall cognitive status: Within functional limits for tasks assessed   SENSATION: Constant N/T throughout L UE into all fingers   POSTURE: rounded shoulders, forward head, and increased thoracic kyphosis   PALPATION: TTP L UT, pec major, pec minor, medial border of scapula; no overt anterior scalene or first rib tightness/hypomobility   LE ROM:  ROM Right 03/28/2023 Left 03/28/2023  Hip flexion    Hip extension    Hip abduction    Hip adduction    Hip internal rotation    Hip external rotation    Knee flexion    Knee extension  Ankle dorsiflexion 2 0  Ankle plantarflexion    Ankle inversion    Ankle eversion     (Blank rows = not tested, N = WNL, * = concordant pain with testing)  LE MMT:  MMT Right 03/28/2023 Left 03/28/2023  Hip flexion (L2, L3)    Knee extension (L3)    Knee flexion    Hip abduction    Hip extension    Hip external rotation    Hip internal rotation    Hip adduction    Ankle dorsiflexion (L4) 2+* 3+  Ankle plantarflexion (S1)    Ankle inversion    Ankle eversion    Great Toe ext (L5)    Grossly     (Blank rows = not tested, score listed is out of 5 possible points.  N = WNL, D = diminished, C = clear for gross weakness with myotome testing, * = concordant pain with testing)   CERVICAL ROM:    Active ROM A/PROM (deg) eval  Flexion WNL pulls in L shoulder blade  Extension WNL  Right lateral flexion 25  Left lateral flexion 25  Right rotation 40  Left rotation 50   (Blank rows = not tested)   UPPER EXTREMITY  ROM: Grossly WFL    UPPER EXTREMITY MMT:   MMT Right eval Left eval  Shoulder flexion 4+ 4+  Shoulder extension 4+ 4  Shoulder abduction 4+ 4+  Shoulder adduction      Shoulder internal rotation 5 5  Shoulder external rotation 4 4  Middle trapezius 4- 3+  Lower trapezius      Elbow flexion 5 5  Elbow extension 5 5  Wrist flexion      Wrist extension 4 4+ pain  Wrist ulnar deviation      Wrist radial deviation      Wrist pronation      Wrist supination      Grip strength 55 lbs 40 lbs   (Blank rows = not tested)   CERVICAL SPECIAL TESTS:  Did not assess Roo's test (+) for increased symptoms on L UE neural tension testing (+) on L Tinel test (+) on L    FUNCTIONAL TESTS:  Did not assess    PATIENT EDUCATION:   04/10/2023 Education details: addressed pt questions regarding her heel / pump bump. Person educated: Patient Education method: Explanation, Demonstration, and Handouts Education comprehension: verbalized understanding, returned demonstration, and needs further education   HOME EXERCISE PROGRAM: Access Code: [email or web address] URL: [email or web address] Date: 04/03/2023 Prepared by: Lulu Riding  Exercises - Seated Scapular Retraction  - 1 x daily - 7 x weekly - 2 sets - 10 reps - Supine Shoulder External Rotation in Abduction  - 1 x daily - 7 x weekly - 2 sets - 10 reps - Shoulder External Rotation in 45 Degrees Abduction  - 1 x daily - 7 x weekly - 3 sets - 10 reps - Corner Pec Minor Stretch  - 1 x daily - 7 x weekly - 2 sets - 30 sec hold - Doorway Pec Stretch at 60 Elevation  - 1 x daily - 7 x weekly - 3 sets - 10 reps - Long Sitting Plantar Fascia Stretch with Towel  - 3 x daily - 7 x weekly - 1 sets - 2 reps - 2 minutes hold - Ankle and Toe Plantarflexion with Resistance  - 1 x daily - 7 x weekly - 3 sets - 10 reps - Seated Rhomboid Stretch  - 3 x daily -  7 x weekly - 1-2 sets - 2 reps - 30  hold - Scapular retraction with ER (MONEY)  - 1 x  daily - 7 x weekly - 2 sets - 12 reps  [facility] Adult PT Treatment:                                                DATE: 04/10/2023 Therapeutic Exercise: Seated Shoulder IR/ ER 2 x 12 with YTB Rows 2 x 15 with RTB  Standing sleeper stretch for LUE 2 x 30 sec  Updated HEP for rows, shoulder IR/ER, and standing sleeper stretch  Manual Therapy: MTPR along the L infraspinatus/ teres minor Taught how to perform at home with tennis ball    [facility] Adult PT Treatment:                                                DATE: 04/03/23 Therapeutic Exercise: Rhomboid stretch 2 x 30  Double ER with scapular retraction 2 x 12 with RTB L upper trap stretch 2 x 30 sec Seated chin tuck 1 x 10 holding 5 seconds  Manual Therapy: MTPR along the pec minor and rhomboid/ middle trap Taught how to perform with with theracane and reviewed benefits L first rib mobs grade III  Trigger Point Dry-Needling  Treatment instructions: Expect mild to moderate muscle soreness. S/S of pneumothorax if dry needled over a lung field, and to seek immediate medical attention should they occur. Patient verbalized understanding of these instructions and education.  Patient Consent Given: Yes Education handout provided: Previously provided Muscles treated: L scalenes Electrical stimulation performed: No Parameters: N/A Treatment response/outcome: Twitch response   TREATMENT 5/2:  Manual therapy - STM L UT - STM R gastroc -Skilled assessment and palpation for TPDN  Trigger Point Dry-Needling  Treatment instructions: Expect mild to moderate muscle soreness. S/S of pneumothorax if dry needled over a lung field, and to seek immediate medical attention should they occur. Patient verbalized understanding of these instructions and education.   Patient Consent Given: Yes Education handout provided: Yes Muscles treated: L UT, R gastroc/soleus Electrical stimulation performed: No Parameters: N/A Treatment response/outcome: Improved  muscle extensibility   ASSESSMENT:   CLINICAL IMPRESSION: Pt arrives to PT reporting significant improvement in her R ankle and has since halted using her boot and reports pain at a 1/10. Continued focus on the L shoulder per pt report of pain located in the posterior aspect of the shoulder along the external rotators. She responded well to manual trigger point release and provided information on how to perform at home. She reports minimal N/T in the LUE which continues to be reduced since starting PT, and noted pain in the L shoulder improved compared to coming into today. Plan to reassess pt next visit and determine if more physical therapy is warranted.   OBJECTIVE IMPAIRMENTS: decreased activity tolerance, decreased strength, increased fascial restrictions, impaired sensation, impaired UE functional use, improper body mechanics, postural dysfunction,  and pain   ACTIVITY LIMITATIONS: carrying, lifting, and caring for others   PARTICIPATION LIMITATIONS: meal prep, cleaning, driving, and yard work   PERSONAL FACTORS: Past/current experiences and Time since onset of injury/illness/exacerbation are also affecting patient's functional outcome.    REHAB  POTENTIAL: Good   CLINICAL DECISION MAKING: Stable/uncomplicated   EVALUATION COMPLEXITY: Low     GOALS: Goals reviewed with patient? Yes   SHORT TERM GOALS: Target date: 04/03/2023   Pt will be ind with initial HEP Baseline:  Goal status: MET 04/03/2023   2.  Pt will be ind with maintaining shoulders down and back to decrease pec tightness Baseline:  Goal status: MET  04/03/2023     LONG TERM GOALS: Target date: 04/24/2023   Pt will be management and progression of HEP Baseline:  Goal status: INITIAL   2.  Pt will report decrease in N/T by >/=50% Baseline: Rates it 5/10 Goal status: INITIAL   3.  Pt will report no instances of pain/pulling in arm pit or shoulder blade to demo improved muscular imbalances Baseline:  Goal  status: INITIAL   4.  Pt will be able to hold grandchild x 10 min with proper shoulder mechanics to demo increased functional shoulder strength/endurance Baseline:  Goal status: INITIAL  5. Jaydynn will improve right ankle DF to 10 degrees  Evaluation/Baseline (03/28/2023): 2 degrees Goal status: INITIAL  6.  Chelcy will self report >/= 50% decrease R achilles in pain from evaluation   Evaluation/Baseline (03/28/2023): 9/10 max pain Goal status: INITIAL     PLAN:   PT FREQUENCY: 1x/week   PT DURATION: 6 weeks   PLANNED INTERVENTIONS: Therapeutic exercises, Therapeutic activity, Neuromuscular re-education, Balance training, Gait training, Patient/Family education, Self Care, Joint mobilization, Dry Needling, Electrical stimulation, Cryotherapy, Moist heat, Taping, Ionotophoresis 4mg /ml Dexamethasone, Manual therapy, and Re-evaluation   PLAN FOR NEXT SESSION: Assess response to HEP/TPDN. Consider more TPDN and manual work. Continue posterior shoulder girdle strengthening. Consider first rib mobilizations. Initiate nerve glides.  Response to rotator cuff strengthening, Re-assessment/ re-certification next visit.      Alif Petrak PT, DPT, LAT, ATC  04/10/23  11:55 AM

## 2023-04-15 NOTE — Progress Notes (Unsigned)
    [name] D.[name] [address] [place] [address] Phone: [phone]   Assessment and Plan:     There are no diagnoses linked to this encounter.  ***   Pertinent previous records reviewed include ***   Follow Up: ***     Subjective:   I, Latasha Hicks, am serving as a Neurosurgeon for Doctor Richardean Sale   Chief Complaint: left arm weakness    HPI:    02/26/23 Patient is a 62 year old female complaining of left arm weakness. Patient states that she has had pain for 7 weeks, tingling down her arm , pain under shoulder blade and armpit,intermittent pain in the arm down to the hand , tylenol and ibu for the pain muscle relaxer didn't help as well, decreased ROM , no MOI, no pain radiating up he neck, today is the firs day it felt okay,    03/26/2023 Patient states she is doing better with her arm , still some pain and  tingling but not as bad or as constant, she now has ankle pain and a knot on her right ankle has seen PCP for it was told to use ointment and that didn't work was told it was non surgical , she has pain , she is not able to walk    04/16/2023 Patient states   Relevant Historical Information: None pertinent  Additional pertinent review of systems negative.   Current Outpatient Medications:    allopurinol (ZYLOPRIM) 100 MG tablet, Take 1 tablet (100 mg total) by mouth daily., Disp: 90 tablet, Rfl: 3   b complex vitamins tablet, Take 1 tablet by mouth daily., Disp: 100 tablet, Rfl: 3   Cholecalciferol (VITAMIN D3) 50 MCG (2000 UT) capsule, Take 1 capsule (2,000 Units total) by mouth daily., Disp: 100 capsule, Rfl: 3   Cyanocobalamin (VITAMIN B-12) 1000 MCG SUBL, Place 1 tablet (1,000 mcg total) under the tongue daily., Disp: 100 tablet, Rfl: 3   cyclobenzaprine (FLEXERIL) 5 MG tablet, Take 1 tablet (5 mg total) by mouth at bedtime., Disp: 30 tablet, Rfl: 0   famotidine (PEPCID) 40 MG tablet, Take 1 tablet (40 mg  total) by mouth daily., Disp: 90 tablet, Rfl: 3   furosemide (LASIX) 20 MG tablet, TAKE 1 TABLET BY MOUTH EVERY DAY AS NEEDED, Disp: 90 tablet, Rfl: 1   meloxicam (MOBIC) 15 MG tablet, Take 1 tablet (15 mg total) by mouth daily., Disp: 30 tablet, Rfl: 0   potassium citrate (UROCIT-K) 5 MEQ (540 MG) SR tablet, Take 1 tablet (5 mEq total) by mouth 3 (three) times daily with meals., Disp: 90 tablet, Rfl: 11   Probiotic Product (PROBIOTIC-10 PO), Take by mouth., Disp: , Rfl:    Semaglutide-Weight Management (WEGOVY) 1 MG/0.5ML SOAJ, Inject 1 mg into the skin once a week., Disp: 2 mL, Rfl: 3   Objective:     There were no vitals filed for this visit.    There is no height or weight on file to calculate BMI.    Physical Exam:    ***   Electronically signed by:  [name] D.[name] 11:44 AM 04/15/23

## 2023-04-16 ENCOUNTER — Ambulatory Visit (INDEPENDENT_AMBULATORY_CARE_PROVIDER_SITE_OTHER): Payer: 59 | Admitting: Sports Medicine

## 2023-04-16 VITALS — BP 130/78 | HR 46 | Ht 65.0 in | Wt 256.0 lb

## 2023-04-16 DIAGNOSIS — G8929 Other chronic pain: Secondary | ICD-10-CM

## 2023-04-16 DIAGNOSIS — M79671 Pain in right foot: Secondary | ICD-10-CM

## 2023-04-16 DIAGNOSIS — M542 Cervicalgia: Secondary | ICD-10-CM

## 2023-04-16 DIAGNOSIS — M7661 Achilles tendinitis, right leg: Secondary | ICD-10-CM

## 2023-04-16 NOTE — Patient Instructions (Signed)
Tylenol [phone] mg 2-3 times a day for pain relief  Use Voltaren gel over areas of pain  Continue using heating pads Continue HEP and PT  Continue using night splint for foot  Can use boot as needed  4-8 week follow up

## 2023-04-17 ENCOUNTER — Encounter: Payer: Self-pay | Admitting: Physical Therapy

## 2023-04-17 ENCOUNTER — Ambulatory Visit: Payer: 59 | Admitting: Physical Therapy

## 2023-04-17 DIAGNOSIS — G540 Brachial plexus disorders: Secondary | ICD-10-CM

## 2023-04-17 DIAGNOSIS — M25571 Pain in right ankle and joints of right foot: Secondary | ICD-10-CM

## 2023-04-17 DIAGNOSIS — R208 Other disturbances of skin sensation: Secondary | ICD-10-CM

## 2023-04-17 DIAGNOSIS — R29898 Other symptoms and signs involving the musculoskeletal system: Secondary | ICD-10-CM

## 2023-04-17 DIAGNOSIS — M6281 Muscle weakness (generalized): Secondary | ICD-10-CM

## 2023-04-17 NOTE — Therapy (Signed)
OUTPATIENT PHYSICAL THERAPY TREATMENT NOTE   Patient Name: Latasha Hicks MRN: [id number] DOB:05/01/1961, 62 y.o., female Today's Date: 04/17/2023  PCP: Jacinta Shoe, MD   REFERRING PROVIDER: Richardean Sale, DO   PT End of Session - 04/17/23 1047     Visit Number 6    Number of Visits 6    Date for PT Re-Evaluation 04/24/23    Authorization Type [employer]    Progress Note Due on Visit 10    PT Start Time 1045    PT Stop Time 1126    PT Time Calculation (min) 41 min    Activity Tolerance Patient tolerated treatment well    Behavior During Therapy WFL for tasks assessed/performed               Past Medical History:  Diagnosis Date   Allergic rhinitis    Complication of anesthesia    "hard to wake up in past"   COVID-19 09/2021   Endometriosis    Hematuria    d/t kidney stones    History of kidney stones    History of sleep apnea    NO OSA SINCE WEIGHT LOSS SURGERY AND WEIGHT LOSS 2009   Hydronephrosis    Kidney stones    Osteoarthritis    Personal history of colonic polyps    Question accuracy, patient report only.   Sleep apnea    Urinary incontinence    VENOUS INSUFFICIENCY, CHRONIC 07/05/2007   Annotation: LE Qualifier: Diagnosis of  By: Dance CMA (AAMA), Kim     Vitamin D deficiency    Past Surgical History:  Procedure Laterality Date   ABDOMINAL SURGERY     ACHILLES TENDON SURGERY Left 12/2017   BREAST CYST EXCISION Left    COLONOSCOPY N/A 09/04/2013   Procedure: COLONOSCOPY;  Surgeon: Louis Meckel, MD;  Location: [facility] END[facility];  Service: Endoscopy;  Laterality: N/A;   COLONOSCOPY W/ POLYPECTOMY     CYSTOSCOPY WITH RETROGRADE PYELOGRAM, URETEROSCOPY AND STENT PLACEMENT Left 01/30/2019   Procedure: CYSTOSCOPY WITH RETROGRADE PYELOGRAM, URETEROSCOPY AND STENT PLACEMENT;  Surgeon: Malen Gauze, MD;  Location: [facility];  Service: Urology;  Laterality: Left;  1 HR   CYSTOSCOPY WITH RETROGRADE PYELOGRAM, URETEROSCOPY AND STENT  PLACEMENT Bilateral 04/19/2020   Procedure: CYSTOSCOPY WITH RETROGRADE PYELOGRAM, URETEROSCOPY AND STENT PLACEMENT;  Surgeon: Malen Gauze, MD;  Location: [facility];  Service: Urology;  Laterality: Bilateral;  2 HRS   CYSTOSCOPY WITH RETROGRADE PYELOGRAM, URETEROSCOPY AND STENT PLACEMENT N/A 05/17/2020   Procedure: CYSTOSCOPY WITH RIGHT RETROGRADE PYELOGRAM, INTRAOPERATIVE FLUOROSCOPY, under one hour, with INTERPRETATION, RIGHT DIAGNOSTIC URETEROSCOPY AND BILATERAL URETERAL STENT REMOVAL;  Surgeon: Malen Gauze, MD;  Location: [facility];  Service: Urology;  Laterality: N/A;  30 MINS   DILATION AND CURETTAGE OF UTERUS     HIATAL HERNIA REPAIR     HOLMIUM LASER APPLICATION Left 01/30/2019   Procedure: HOLMIUM LASER APPLICATION;  Surgeon: Malen Gauze, MD;  Location: [facility];  Service: Urology;  Laterality: Left;   HOLMIUM LASER APPLICATION Bilateral 04/19/2020   Procedure: HOLMIUM LASER APPLICATION;  Surgeon: Malen Gauze, MD;  Location: [facility];  Service: Urology;  Laterality: Bilateral;   lap band replacement  2012   LAPAROSCOPIC CHOLECYSTECTOMY     LAPAROSCOPIC GASTRIC BANDING  2009   TOTAL KNEE ARTHROPLASTY Left 09/29/2018   Procedure: LEFT TOTAL KNEE ARTHROPLASTY;  Surgeon: Tarry Kos, MD;  Location: [facility];  Service: Orthopedics;  Laterality: Left;  VAGINAL HYSTERECTOMY     partial   Patient Active Problem List   Diagnosis Date Noted   Rash 08/06/2022   Plantar fasciitis of right foot 05/24/2022   Leg edema 05/24/2022   COVID-19 10/02/2021   Acute cough 10/02/2021   Stye 05/23/2021   Nephrolithiasis 05/23/2021   Primary osteoarthritis of left knee 09/29/2018   Hx of total knee replacement, left 09/29/2018   Colon polyp 08/28/2018   Diarrhea 07/10/2018   Chronic pain of right knee 05/01/2018   Hematuria 12/24/2017   Left flank pain 12/24/2017   Skin lesion 04/24/2017   Symptomatic abdominal apron  04/15/2017   H/O laparoscopic adjustable gastric banding 04/15/2017   Well adult exam 10/02/2016   Salivary gland infection 06/01/2016   Paresthesia 06/10/2015   Left leg swelling 07/26/2014   GI bleed 09/03/2013   Urinary incontinence 08/21/2013   Internal hemorrhoids 08/21/2013   Unspecified constipation 06/12/2013   LBP (low back pain) 06/12/2013   History of colonic polyps 06/12/2013   Right LBP 05/06/2013   Perianal abscess 05/01/2013   GRIEF REACTION 03/26/2008   ALLERGIC RHINITIS 03/26/2008   OSTEOARTHRITIS 03/26/2008   Bariatric surgery status 03/26/2008   Vitamin D deficiency 07/05/2007   OBESITY, MORBID 07/05/2007   Obstructive sleep apnea 07/05/2007   VENOUS INSUFFICIENCY, CHRONIC 07/05/2007   Calculus of gallbladder 07/05/2007   HYDRONEPHROSIS 07/05/2007    THERAPY DIAG:  Muscle weakness  Pain in right ankle and joints of right foot  Other symptoms and signs involving the musculoskeletal system  Other disturbances of skin sensation  Thoracic outlet syndrome   Rationale for Evaluation and Treatment: Rehabilitation  REFERRING DIAG: M54.2 (ICD-10-CM) - Neck pain R29.898 (ICD-10-CM) - Left arm weakness G54.0 (ICD-10-CM) - Thoracic outlet syndrome of left thoracic outlet   PERTINENT HISTORY: Per H&P from referring provider: Patient is a 62 year old female complaining of left arm weakness. Patient states that she has had pain for 7 weeks, tingling down her arm , pain under shoulder blade and armpit,intermittent pain in the arm down to the hand   PRECAUTIONS/RESTRICTIONS:   none  SUBJECTIVE:  Pt reports that her neck and ankle have been improving.  She rates her shoulder pain at 3/10 and her ankle at 1-2/10.   PAIN:   Pain:  Are you having pain? Yes Pain location: R achilles tendon NPRS scale:  highest 9/10 current 1/10 best 0/10 Aggravating factors: walking, standing Relieving factors: rest, boot Pain description: sharp and aching Severity:  high Irritability: high Stage: Chronic Stability: staying the same 24 hour pattern:    Are you having pain? Yes: NPRS scale: 5/10 current, 9 at worst/10 Pain location: under arm pit and into arm Pain description: shooting Aggravating factors: not sure-- feels it comes randomly Relieving factors: Rest  OBJECTIVE: (objective measures completed at initial evaluation unless otherwise dated)  DIAGNOSTIC FINDINGS:  None on file   PATIENT SURVEYS:  FOTO 78; predicted 80   COGNITION: Overall cognitive status: Within functional limits for tasks assessed   SENSATION: Constant N/T throughout L UE into all fingers   POSTURE: rounded shoulders, forward head, and increased thoracic kyphosis   PALPATION: TTP L UT, pec major, pec minor, medial border of scapula; no overt anterior scalene or first rib tightness/hypomobility   LE ROM:  ROM Right 03/28/2023 Left 03/28/2023  Hip flexion    Hip extension    Hip abduction    Hip adduction    Hip internal rotation    Hip external rotation  Knee flexion    Knee extension    Ankle dorsiflexion 2 0  Ankle plantarflexion    Ankle inversion    Ankle eversion     (Blank rows = not tested, N = WNL, * = concordant pain with testing)  LE MMT:  MMT Right 03/28/2023 Left 03/28/2023  Hip flexion (L2, L3)    Knee extension (L3)    Knee flexion    Hip abduction    Hip extension    Hip external rotation    Hip internal rotation    Hip adduction    Ankle dorsiflexion (L4) 2+* 3+  Ankle plantarflexion (S1)    Ankle inversion    Ankle eversion    Great Toe ext (L5)    Grossly     (Blank rows = not tested, score listed is out of 5 possible points.  N = WNL, D = diminished, C = clear for gross weakness with myotome testing, * = concordant pain with testing)   CERVICAL ROM:    Active ROM A/PROM (deg) eval  Flexion WNL pulls in L shoulder blade  Extension WNL  Right lateral flexion 25  Left lateral flexion 25  Right rotation 40  Left  rotation 50   (Blank rows = not tested)   UPPER EXTREMITY ROM: Grossly WFL    UPPER EXTREMITY MMT:   MMT Right eval Left eval  Shoulder flexion 4+ 4+  Shoulder extension 4+ 4  Shoulder abduction 4+ 4+  Shoulder adduction      Shoulder internal rotation 5 5  Shoulder external rotation 4 4  Middle trapezius 4- 3+  Lower trapezius      Elbow flexion 5 5  Elbow extension 5 5  Wrist flexion      Wrist extension 4 4+ pain  Wrist ulnar deviation      Wrist radial deviation      Wrist pronation      Wrist supination      Grip strength 55 lbs 40 lbs   (Blank rows = not tested)   CERVICAL SPECIAL TESTS:  Did not assess Roo's test (+) for increased symptoms on L UE neural tension testing (+) on L Tinel test (+) on L    FUNCTIONAL TESTS:  Did not assess    PATIENT EDUCATION:   04/10/2023 Education details: addressed pt questions regarding her heel / pump bump. Person educated: Patient Education method: Explanation, Demonstration, and Handouts Education comprehension: verbalized understanding, returned demonstration, and needs further education   HOME EXERCISE PROGRAM: Access Code: [email or web address] URL: [email or web address] Date: 04/03/2023 Prepared by: Lulu Riding  Exercises - Seated Scapular Retraction  - 1 x daily - 7 x weekly - 2 sets - 10 reps - Supine Shoulder External Rotation in Abduction  - 1 x daily - 7 x weekly - 2 sets - 10 reps - Shoulder External Rotation in 45 Degrees Abduction  - 1 x daily - 7 x weekly - 3 sets - 10 reps - Corner Pec Minor Stretch  - 1 x daily - 7 x weekly - 2 sets - 30 sec hold - Doorway Pec Stretch at 60 Elevation  - 1 x daily - 7 x weekly - 3 sets - 10 reps - Long Sitting Plantar Fascia Stretch with Towel  - 3 x daily - 7 x weekly - 1 sets - 2 reps - 2 minutes hold - Ankle and Toe Plantarflexion with Resistance  - 1 x daily - 7 x weekly - 3 sets - 10  reps - Seated Rhomboid Stretch  - 3 x daily - 7 x weekly - 1-2 sets - 2  reps - 30  hold - Scapular retraction with ER (MONEY)  - 1 x daily - 7 x weekly - 2 sets - 12 reps  [facility] Adult PT Treatment:                                                DATE: 04/17/2023 Therapeutic Exercise: Seated W - 2x10 Seated Shoulder IR/ ER 2 x 15 with RTB Rows 2 x 15 with GTB  Cross arm stretch - 30'' x3 Horizontal abd - RTB in supine - 2x15  Modalities: Mechanical traction 25 lbs - 10 min - 1' on 10'' rest  Iontophoresis:  - 1 ml dexamethasone - 4-6 hour slow release patch - placed R ankle  [facility] Adult PT Treatment:                                                DATE: 04/10/2023 Therapeutic Exercise: Seated Shoulder IR/ ER 2 x 12 with YTB Rows 2 x 15 with RTB  Standing sleeper stretch for LUE 2 x 30 sec  Updated HEP for rows, shoulder IR/ER, and standing sleeper stretch  Manual Therapy: MTPR along the L infraspinatus/ teres minor Taught how to perform at home with tennis ball    [facility] Adult PT Treatment:                                                DATE: 04/03/23 Therapeutic Exercise: Rhomboid stretch 2 x 30  Double ER with scapular retraction 2 x 12 with RTB L upper trap stretch 2 x 30 sec Seated chin tuck 1 x 10 holding 5 seconds  Manual Therapy: MTPR along the pec minor and rhomboid/ middle trap Taught how to perform with with theracane and reviewed benefits L first rib mobs grade III  Trigger Point Dry-Needling  Treatment instructions: Expect mild to moderate muscle soreness. S/S of pneumothorax if dry needled over a lung field, and to seek immediate medical attention should they occur. Patient verbalized understanding of these instructions and education.  Patient Consent Given: Yes Education handout provided: Previously provided Muscles treated: L scalenes Electrical stimulation performed: No Parameters: N/A Treatment response/outcome: Twitch response   TREATMENT 5/2:  Manual therapy - STM L UT - STM R gastroc -Skilled assessment and palpation  for TPDN  Trigger Point Dry-Needling  Treatment instructions: Expect mild to moderate muscle soreness. S/S of pneumothorax if dry needled over a lung field, and to seek immediate medical attention should they occur. Patient verbalized understanding of these instructions and education.   Patient Consent Given: Yes Education handout provided: Yes Muscles treated: L UT, R gastroc/soleus Electrical stimulation performed: No Parameters: N/A Treatment response/outcome: Improved muscle extensibility   ASSESSMENT:   CLINICAL IMPRESSION: Pt reports that she continues to see progress with her neck and ankle.  We concentrated on advancing periscapular exercise volume and intensity to good effect.  Trialed mechanical traction which reduced n/t in L UE significantly with only a trace amount in  middle finger following treatment.  Trialed ionto for remaining achilles pain.  Will monitor durability of effects next visit.  OBJECTIVE IMPAIRMENTS: decreased activity tolerance, decreased strength, increased fascial restrictions, impaired sensation, impaired UE functional use, improper body mechanics, postural dysfunction,  and pain   ACTIVITY LIMITATIONS: carrying, lifting, and caring for others   PARTICIPATION LIMITATIONS: meal prep, cleaning, driving, and yard work   PERSONAL FACTORS: Past/current experiences and Time since onset of injury/illness/exacerbation are also affecting patient's functional outcome.    REHAB POTENTIAL: Good   CLINICAL DECISION MAKING: Stable/uncomplicated   EVALUATION COMPLEXITY: Low     GOALS: Goals reviewed with patient? Yes   SHORT TERM GOALS: Target date: 04/03/2023   Pt will be ind with initial HEP Baseline:  Goal status: MET 04/03/2023   2.  Pt will be ind with maintaining shoulders down and back to decrease pec tightness Baseline:  Goal status: MET  04/03/2023     LONG TERM GOALS: Target date: 04/24/2023   Pt will be management and progression of  HEP Baseline:  Goal status: INITIAL   2.  Pt will report decrease in N/T by >/=50% Baseline: Rates it 5/10 Goal status: INITIAL   3.  Pt will report no instances of pain/pulling in arm pit or shoulder blade to demo improved muscular imbalances Baseline:  Goal status: INITIAL   4.  Pt will be able to hold grandchild x 10 min with proper shoulder mechanics to demo increased functional shoulder strength/endurance Baseline:  Goal status: INITIAL  5. Rickayla will improve right ankle DF to 10 degrees  Evaluation/Baseline (03/28/2023): 2 degrees Goal status: INITIAL  6.  Ketti will self report >/= 50% decrease R achilles in pain from evaluation   Evaluation/Baseline (03/28/2023): 9/10 max pain Goal status: INITIAL     PLAN:   PT FREQUENCY: 1x/week   PT DURATION: 6 weeks   PLANNED INTERVENTIONS: Therapeutic exercises, Therapeutic activity, Neuromuscular re-education, Balance training, Gait training, Patient/Family education, Self Care, Joint mobilization, Dry Needling, Electrical stimulation, Cryotherapy, Moist heat, Taping, Ionotophoresis 4mg /ml Dexamethasone, Manual therapy, and Re-evaluation   PLAN FOR NEXT SESSION: Assess response to HEP/TPDN. Consider more TPDN and manual work. Continue posterior shoulder girdle strengthening. Consider first rib mobilizations. Initiate nerve glides.  Response to rotator cuff strengthening, Re-assessment/ re-certification next visit.      Kimberlee [facility] [employer] PT 04/17/23  1:47 PM

## 2023-04-24 ENCOUNTER — Encounter: Payer: Self-pay | Admitting: Physical Therapy

## 2023-04-24 ENCOUNTER — Ambulatory Visit: Payer: 59 | Admitting: Physical Therapy

## 2023-04-24 DIAGNOSIS — G540 Brachial plexus disorders: Secondary | ICD-10-CM | POA: Diagnosis not present

## 2023-04-24 DIAGNOSIS — R29898 Other symptoms and signs involving the musculoskeletal system: Secondary | ICD-10-CM

## 2023-04-24 DIAGNOSIS — M25571 Pain in right ankle and joints of right foot: Secondary | ICD-10-CM

## 2023-04-24 DIAGNOSIS — M6281 Muscle weakness (generalized): Secondary | ICD-10-CM

## 2023-04-24 DIAGNOSIS — R208 Other disturbances of skin sensation: Secondary | ICD-10-CM

## 2023-04-24 NOTE — Therapy (Signed)
OUTPATIENT PHYSICAL THERAPY TREATMENT NOTE   Patient Name: Latasha Hicks MRN: [id number] DOB:1961/08/06, 62 y.o., female Today's Date: 04/24/2023  PCP: Jacinta Shoe, MD   REFERRING PROVIDER: Richardean Sale, DO   PT End of Session - 04/24/23 1046     Visit Number 7    Date for PT Re-Evaluation 06/19/23    Authorization Type [employer]    Progress Note Due on Visit 10    PT Start Time 1045    PT Stop Time 1126    PT Time Calculation (min) 41 min    Activity Tolerance Patient tolerated treatment well    Behavior During Therapy WFL for tasks assessed/performed               Past Medical History:  Diagnosis Date   Allergic rhinitis    Complication of anesthesia    "hard to wake up in past"   COVID-19 09/2021   Endometriosis    Hematuria    d/t kidney stones    History of kidney stones    History of sleep apnea    NO OSA SINCE WEIGHT LOSS SURGERY AND WEIGHT LOSS 2009   Hydronephrosis    Kidney stones    Osteoarthritis    Personal history of colonic polyps    Question accuracy, patient report only.   Sleep apnea    Urinary incontinence    VENOUS INSUFFICIENCY, CHRONIC 07/05/2007   Annotation: LE Qualifier: Diagnosis of  By: Dance CMA (AAMA), Kim     Vitamin D deficiency    Past Surgical History:  Procedure Laterality Date   ABDOMINAL SURGERY     ACHILLES TENDON SURGERY Left 12/2017   BREAST CYST EXCISION Left    COLONOSCOPY N/A 09/04/2013   Procedure: COLONOSCOPY;  Surgeon: Louis Meckel, MD;  Location: [facility] ENDOSCOPY;  Service: Endoscopy;  Laterality: N/A;   COLONOSCOPY W/ POLYPECTOMY     CYSTOSCOPY WITH RETROGRADE PYELOGRAM, URETEROSCOPY AND STENT PLACEMENT Left 01/30/2019   Procedure: CYSTOSCOPY WITH RETROGRADE PYELOGRAM, URETEROSCOPY AND STENT PLACEMENT;  Surgeon: Malen Gauze, MD;  Location: [facility];  Service: Urology;  Laterality: Left;  1 HR   CYSTOSCOPY WITH RETROGRADE PYELOGRAM, URETEROSCOPY AND STENT PLACEMENT Bilateral 04/19/2020    Procedure: CYSTOSCOPY WITH RETROGRADE PYELOGRAM, URETEROSCOPY AND STENT PLACEMENT;  Surgeon: Malen Gauze, MD;  Location: [facility];  Service: Urology;  Laterality: Bilateral;  2 HRS   CYSTOSCOPY WITH RETROGRADE PYELOGRAM, URETEROSCOPY AND STENT PLACEMENT N/A 05/17/2020   Procedure: CYSTOSCOPY WITH RIGHT RETROGRADE PYELOGRAM, INTRAOPERATIVE FLUOROSCOPY, under one hour, with INTERPRETATION, RIGHT DIAGNOSTIC URETEROSCOPY AND BILATERAL URETERAL STENT REMOVAL;  Surgeon: Malen Gauze, MD;  Location: [facility];  Service: Urology;  Laterality: N/A;  30 MINS   DILATION AND CURETTAGE OF UTERUS     HIATAL HERNIA REPAIR     HOLMIUM LASER APPLICATION Left 01/30/2019   Procedure: HOLMIUM LASER APPLICATION;  Surgeon: Malen Gauze, MD;  Location: [facility];  Service: Urology;  Laterality: Left;   HOLMIUM LASER APPLICATION Bilateral 04/19/2020   Procedure: HOLMIUM LASER APPLICATION;  Surgeon: Malen Gauze, MD;  Location: [facility];  Service: Urology;  Laterality: Bilateral;   lap band replacement  2012   LAPAROSCOPIC CHOLECYSTECTOMY     LAPAROSCOPIC GASTRIC BANDING  2009   TOTAL KNEE ARTHROPLASTY Left 09/29/2018   Procedure: LEFT TOTAL KNEE ARTHROPLASTY;  Surgeon: Tarry Kos, MD;  Location: [facility];  Service: Orthopedics;  Laterality: Left;   VAGINAL HYSTERECTOMY  partial   Patient Active Problem List   Diagnosis Date Noted   Rash 08/06/2022   Plantar fasciitis of right foot 05/24/2022   Leg edema 05/24/2022   COVID-19 10/02/2021   Acute cough 10/02/2021   Stye 05/23/2021   Nephrolithiasis 05/23/2021   Primary osteoarthritis of left knee 09/29/2018   Hx of total knee replacement, left 09/29/2018   Colon polyp 08/28/2018   Diarrhea 07/10/2018   Chronic pain of right knee 05/01/2018   Hematuria 12/24/2017   Left flank pain 12/24/2017   Skin lesion 04/24/2017   Symptomatic abdominal apron 04/15/2017   H/O laparoscopic  adjustable gastric banding 04/15/2017   Well adult exam 10/02/2016   Salivary gland infection 06/01/2016   Paresthesia 06/10/2015   Left leg swelling 07/26/2014   GI bleed 09/03/2013   Urinary incontinence 08/21/2013   Internal hemorrhoids 08/21/2013   Unspecified constipation 06/12/2013   LBP (low back pain) 06/12/2013   History of colonic polyps 06/12/2013   Right LBP 05/06/2013   Perianal abscess 05/01/2013   GRIEF REACTION 03/26/2008   ALLERGIC RHINITIS 03/26/2008   OSTEOARTHRITIS 03/26/2008   Bariatric surgery status 03/26/2008   Vitamin D deficiency 07/05/2007   OBESITY, MORBID 07/05/2007   Obstructive sleep apnea 07/05/2007   VENOUS INSUFFICIENCY, CHRONIC 07/05/2007   Calculus of gallbladder 07/05/2007   HYDRONEPHROSIS 07/05/2007    THERAPY DIAG:  Muscle weakness  Pain in right ankle and joints of right foot  Other symptoms and signs involving the musculoskeletal system  Other disturbances of skin sensation   Rationale for Evaluation and Treatment: Rehabilitation  REFERRING DIAG: M54.2 (ICD-10-CM) - Neck pain R29.898 (ICD-10-CM) - Left arm weakness G54.0 (ICD-10-CM) - Thoracic outlet syndrome of left thoracic outlet   PERTINENT HISTORY: Per H&P from referring provider: Patient is a 62 year old female complaining of left arm weakness. Patient states that she has had pain for 7 weeks, tingling down her arm , pain under shoulder blade and armpit,intermittent pain in the arm down to the hand   PRECAUTIONS/RESTRICTIONS:   none  SUBJECTIVE:  Pt reports that the cervical traction machine was very helpful and she has had only one instance on N/T in her arm since last visit.  She rates her achilles pain @ 4/10.  PAIN:   Pain:  Are you having pain? Yes Pain location: R achilles tendon NPRS scale:  highest 9/10 current 1/10 best 0/10 Aggravating factors: walking, standing Relieving factors: rest, boot Pain description: sharp and aching Severity:  high Irritability: high Stage: Chronic Stability: staying the same 24 hour pattern:    Are you having pain? Yes: NPRS scale: 5/10 current, 9 at worst/10 Pain location: under arm pit and into arm Pain description: shooting Aggravating factors: not sure-- feels it comes randomly Relieving factors: Rest  OBJECTIVE: (objective measures completed at initial evaluation unless otherwise dated)  DIAGNOSTIC FINDINGS:  None on file   PATIENT SURVEYS:  FOTO 78; predicted 80   COGNITION: Overall cognitive status: Within functional limits for tasks assessed   SENSATION: Constant N/T throughout L UE into all fingers   POSTURE: rounded shoulders, forward head, and increased thoracic kyphosis   PALPATION: TTP L UT, pec major, pec minor, medial border of scapula; no overt anterior scalene or first rib tightness/hypomobility   LE ROM:  ROM Right 03/28/2023 Left 03/28/2023  Hip flexion    Hip extension    Hip abduction    Hip adduction    Hip internal rotation    Hip external rotation  Knee flexion    Knee extension    Ankle dorsiflexion 2 0  Ankle plantarflexion    Ankle inversion    Ankle eversion     (Blank rows = not tested, N = WNL, * = concordant pain with testing)  LE MMT:  MMT Right 03/28/2023 Left 03/28/2023  Hip flexion (L2, L3)    Knee extension (L3)    Knee flexion    Hip abduction    Hip extension    Hip external rotation    Hip internal rotation    Hip adduction    Ankle dorsiflexion (L4) 2+* 3+  Ankle plantarflexion (S1)    Ankle inversion    Ankle eversion    Great Toe ext (L5)    Grossly     (Blank rows = not tested, score listed is out of 5 possible points.  N = WNL, D = diminished, C = clear for gross weakness with myotome testing, * = concordant pain with testing)   CERVICAL ROM:    Active ROM A/PROM (deg) eval  Flexion WNL pulls in L shoulder blade  Extension WNL  Right lateral flexion 25  Left lateral flexion 25  Right rotation 40  Left  rotation 50   (Blank rows = not tested)   UPPER EXTREMITY ROM: Grossly WFL    UPPER EXTREMITY MMT:   MMT Right eval Left eval  Shoulder flexion 4+ 4+  Shoulder extension 4+ 4  Shoulder abduction 4+ 4+  Shoulder adduction      Shoulder internal rotation 5 5  Shoulder external rotation 4 4  Middle trapezius 4- 3+  Lower trapezius      Elbow flexion 5 5  Elbow extension 5 5  Wrist flexion      Wrist extension 4 4+ pain  Wrist ulnar deviation      Wrist radial deviation      Wrist pronation      Wrist supination      Grip strength 55 lbs 40 lbs   (Blank rows = not tested)   CERVICAL SPECIAL TESTS:  Did not assess Roo's test (+) for increased symptoms on L UE neural tension testing (+) on L Tinel test (+) on L    FUNCTIONAL TESTS:  Did not assess    PATIENT EDUCATION:   04/10/2023 Education details: addressed pt questions regarding her heel / pump bump. Person educated: Patient Education method: Explanation, Demonstration, and Handouts Education comprehension: verbalized understanding, returned demonstration, and needs further education   HOME EXERCISE PROGRAM: Access Code: [email or web address] URL: [email or web address] Date: 04/03/2023 Prepared by: Lulu Riding  Exercises - Seated Scapular Retraction  - 1 x daily - 7 x weekly - 2 sets - 10 reps - Supine Shoulder External Rotation in Abduction  - 1 x daily - 7 x weekly - 2 sets - 10 reps - Shoulder External Rotation in 45 Degrees Abduction  - 1 x daily - 7 x weekly - 3 sets - 10 reps - Corner Pec Minor Stretch  - 1 x daily - 7 x weekly - 2 sets - 30 sec hold - Doorway Pec Stretch at 60 Elevation  - 1 x daily - 7 x weekly - 3 sets - 10 reps - Long Sitting Plantar Fascia Stretch with Towel  - 3 x daily - 7 x weekly - 1 sets - 2 reps - 2 minutes hold - Ankle and Toe Plantarflexion with Resistance  - 1 x daily - 7 x weekly - 3 sets - 10  reps - Seated Rhomboid Stretch  - 3 x daily - 7 x weekly - 1-2 sets - 2  reps - 30  hold - Scapular retraction with ER (MONEY)  - 1 x daily - 7 x weekly - 2 sets - 12 reps  [facility] Adult PT Treatment:                                                DATE: 04/24/2023 Therapeutic Exercise: nu-step L5 24m while taking subjective and planning session with patient 45'' isometric achilles hold on step - x2  Modalities: Mechanical traction 25 lbs - 15 min - 1' on 10'' rest   Vasopneumatic (Game Ready)    Location:  right ankle Time:  10 minutes Pressure:  medium Temperature:  38 degrees  Manual therapy: Skilled palpation to identify trigger points for TDN STM to all listed muscles following TDN  Trigger Point Dry-Needling  Treatment instructions: Expect mild to moderate muscle soreness. S/S of pneumothorax if dry needled over a lung field, and to seek immediate medical attention should they occur. Patient verbalized understanding of these instructions and education.  Patient Consent Given: Yes Education handout provided: No Muscles treated: R G/S Electrical stimulation performed: No Parameters: N/A Treatment response/outcome: pain reduction  [facility] Adult PT Treatment:                                                DATE: 04/10/2023 Therapeutic Exercise: Seated Shoulder IR/ ER 2 x 12 with YTB Rows 2 x 15 with RTB  Standing sleeper stretch for LUE 2 x 30 sec  Updated HEP for rows, shoulder IR/ER, and standing sleeper stretch  Manual Therapy: MTPR along the L infraspinatus/ teres minor Taught how to perform at home with tennis ball    [facility] Adult PT Treatment:                                                DATE: 04/03/23 Therapeutic Exercise: Rhomboid stretch 2 x 30  Double ER with scapular retraction 2 x 12 with RTB L upper trap stretch 2 x 30 sec Seated chin tuck 1 x 10 holding 5 seconds  Manual Therapy: MTPR along the pec minor and rhomboid/ middle trap Taught how to perform with with theracane and reviewed benefits L first rib mobs grade III  Trigger  Point Dry-Needling  Treatment instructions: Expect mild to moderate muscle soreness. S/S of pneumothorax if dry needled over a lung field, and to seek immediate medical attention should they occur. Patient verbalized understanding of these instructions and education.  Patient Consent Given: Yes Education handout provided: Previously provided Muscles treated: L scalenes Electrical stimulation performed: No Parameters: N/A Treatment response/outcome: Twitch response   TREATMENT 5/2:  Manual therapy - STM L UT - STM R gastroc -Skilled assessment and palpation for TPDN  Trigger Point Dry-Needling  Treatment instructions: Expect mild to moderate muscle soreness. S/S of pneumothorax if dry needled over a lung field, and to seek immediate medical attention should they occur. Patient verbalized understanding of these instructions and education.   Patient Consent Given:  Yes Education handout provided: Yes Muscles treated: L UT, R gastroc/soleus Electrical stimulation performed: No Parameters: N/A Treatment response/outcome: Improved muscle extensibility   ASSESSMENT:   CLINICAL IMPRESSION: Shawnique tolerated session well with no adverse reaction.  Returned to TDN of R G/S as achilles pain is higher today.  Initiated isometrics for achilles tendon.  Continued traction as she had significant pain relief with this last visit.  OBJECTIVE IMPAIRMENTS: decreased activity tolerance, decreased strength, increased fascial restrictions, impaired sensation, impaired UE functional use, improper body mechanics, postural dysfunction,  and pain   ACTIVITY LIMITATIONS: carrying, lifting, and caring for others   PARTICIPATION LIMITATIONS: meal prep, cleaning, driving, and yard work   PERSONAL FACTORS: Past/current experiences and Time since onset of injury/illness/exacerbation are also affecting patient's functional outcome.    REHAB POTENTIAL: Good   CLINICAL DECISION MAKING: Stable/uncomplicated    EVALUATION COMPLEXITY: Low     GOALS: Goals reviewed with patient? Yes   SHORT TERM GOALS: Target date: 04/03/2023   Pt will be ind with initial HEP Baseline:  Goal status: MET 04/03/2023   2.  Pt will be ind with maintaining shoulders down and back to decrease pec tightness Baseline:  Goal status: MET  04/03/2023     LONG TERM GOALS: Target date: 04/24/2023 extended to 06/19/2023   Pt will be management and progression of HEP Baseline:  Goal status: INITIAL   2.  Pt will report decrease in N/T by >/=50% Baseline: Rates it 5/10 Goal status: INITIAL   3.  Pt will report no instances of pain/pulling in arm pit or shoulder blade to demo improved muscular imbalances Baseline:  Goal status: INITIAL   4.  Pt will be able to hold grandchild x 10 min with proper shoulder mechanics to demo increased functional shoulder strength/endurance Baseline:  Goal status: INITIAL  5. Tamiya will improve right ankle DF to 10 degrees  Evaluation/Baseline (03/28/2023): 2 degrees Goal status: INITIAL  6.  Keyoni will self report >/= 50% decrease R achilles in pain from evaluation   Evaluation/Baseline (03/28/2023): 9/10 max pain Goal status: INITIAL     PLAN:   PT FREQUENCY: 1x/week   PT DURATION: 8 weeks   PLANNED INTERVENTIONS: Therapeutic exercises, Therapeutic activity, Neuromuscular re-education, Balance training, Gait training, Patient/Family education, Self Care, Joint mobilization, Dry Needling, Electrical stimulation, Cryotherapy, Moist heat, Taping, Ionotophoresis 4mg /ml Dexamethasone, Manual therapy, and Re-evaluation   PLAN FOR NEXT SESSION: Assess response to HEP/TPDN. Consider more TPDN and manual work. Continue posterior shoulder girdle strengthening. Consider first rib mobilizations. Initiate nerve glides.  Response to rotator cuff strengthening, Re-assessment/ re-certification next visit.      Kimberlee Nearing Jordynn Marcella PT 04/24/23  12:23 PM

## 2023-05-21 NOTE — Progress Notes (Signed)
[facility] [address] [place] [address] Phone: [phone]   Assessment and Plan:     1. Neck pain -Chronic, stable, subsequent visit - Symptoms have overall stabilized and are doing well with intermittent Flexeril use and as needed Tylenol use - May continue to use Flexeril as needed for muscle spasms - May continue Tylenol as needed for day-to-day pain relief - Patient completed physical therapy.  Recommend continuing HEP  2. Chronic pain of right heel 3. Achilles tendinitis of right lower extremity  -Chronic with exacerbation, subsequent visit - Continued pain consistent with Achilles tendinopathy that did not sufficiently improve with physical therapy, HEP, course of meloxicam - Patient may continue to use boot and night splint as needed - Will proceed with ECSWT once weekly for 3 weeks then reevaluate in 4 weeks  Pertinent previous records reviewed include none   Follow Up: 1 week for first ECSWT treatment   Subjective:   I, Latasha Hicks, am serving as a Neurosurgeon for Doctor Latasha Hicks   Chief Complaint: left arm weakness    HPI:    02/26/23 Patient is a 62 year old female complaining of left arm weakness. Patient states that she has had pain for 7 weeks, tingling down her arm , pain under shoulder blade and armpit,intermittent pain in the arm down to the hand , tylenol and ibu for the pain muscle relaxer didn't help as well, decreased ROM , no MOI, no pain radiating up he neck, today is the firs day it felt okay,    03/26/2023 Patient states she is doing better with her arm , still some pain and  tingling but not as bad or as constant, she now has ankle pain and a knot on her right ankle has seen PCP for it was told to use ointment and that didn't work was told it was non surgical , she has pain , she is not able to walk    04/16/2023 Patient states its is getting better but not all the way healed     05/28/2023 Patient states that her arm is a lot better , ankle still hurts but it is a day to day thing    Relevant Historical Information: None pertinent  Additional pertinent review of systems negative.   Current Outpatient Medications:    allopurinol (ZYLOPRIM) 100 MG tablet, Take 1 tablet (100 mg total) by mouth daily., Disp: 90 tablet, Rfl: 3   b complex vitamins tablet, Take 1 tablet by mouth daily., Disp: 100 tablet, Rfl: 3   Cholecalciferol (VITAMIN D3) 50 MCG (2000 UT) capsule, Take 1 capsule (2,000 Units total) by mouth daily., Disp: 100 capsule, Rfl: 3   Cyanocobalamin (VITAMIN B-12) 1000 MCG SUBL, Place 1 tablet (1,000 mcg total) under the tongue daily., Disp: 100 tablet, Rfl: 3   cyclobenzaprine (FLEXERIL) 5 MG tablet, Take 1 tablet (5 mg total) by mouth at bedtime., Disp: 30 tablet, Rfl: 0   famotidine (PEPCID) 40 MG tablet, Take 1 tablet (40 mg total) by mouth daily., Disp: 90 tablet, Rfl: 3   furosemide (LASIX) 20 MG tablet, TAKE 1 TABLET BY MOUTH EVERY DAY AS NEEDED, Disp: 90 tablet, Rfl: 1   meloxicam (MOBIC) 15 MG tablet, Take 1 tablet (15 mg total) by mouth daily., Disp: 30 tablet, Rfl: 0   potassium citrate (UROCIT-K) 5 MEQ (540 MG) SR tablet, Take 1 tablet (5 mEq total) by mouth 3 (three) times daily with meals., Disp: 90  tablet, Rfl: 11   Probiotic Product (PROBIOTIC-10 PO), Take by mouth., Disp: , Rfl:    Semaglutide-Weight Management (WEGOVY) 1 MG/0.5ML SOAJ, Inject 1 mg into the skin once a week., Disp: 2 mL, Rfl: 3   Objective:     Vitals:   05/28/23 1004  BP: 132/78  Pulse: 81  SpO2: 99%  Weight: 248 lb (112.5 kg)  Height: 5\' 5"  (1.651 m)      Body mass index is 41.27 kg/m.    Physical Exam:    Gen: Appears well, nad, nontoxic and pleasant Psych: Alert and oriented, appropriate mood and affect Neuro: sensation intact, strength is 5/5 with df/pf/inv/ev, muscle tone wnl Skin: no susupicious lesions or rashes   Right ankle:  No deformity, no  swelling or effusion Pes planus Thickening of Achilles tendon at calcaneal insertion that is TTP NTTP over fibular head, lat mal, medial mal, achilles, navicular, base of 5th, ATFL, CFL, deltoid, calcaneous or midfoot ROM DF 30, PF 45, inv/ev intact Negative ant drawer, talar tilt, rotation test, squeeze test. Neg thompson No pain with resisted inversion or eversion  Pain with resisted plantarflexion    Electronically signed by:  Latasha Hicks [facility] 11:52 AM 05/28/23

## 2023-05-28 ENCOUNTER — Ambulatory Visit (INDEPENDENT_AMBULATORY_CARE_PROVIDER_SITE_OTHER): Payer: 59 | Admitting: Sports Medicine

## 2023-05-28 VITALS — BP 132/78 | HR 81 | Ht 65.0 in | Wt 248.0 lb

## 2023-05-28 DIAGNOSIS — G8929 Other chronic pain: Secondary | ICD-10-CM | POA: Diagnosis not present

## 2023-05-28 DIAGNOSIS — M542 Cervicalgia: Secondary | ICD-10-CM | POA: Diagnosis not present

## 2023-05-28 DIAGNOSIS — M79671 Pain in right foot: Secondary | ICD-10-CM | POA: Diagnosis not present

## 2023-05-28 DIAGNOSIS — M7661 Achilles tendinitis, right leg: Secondary | ICD-10-CM | POA: Diagnosis not present

## 2023-05-28 NOTE — Patient Instructions (Signed)
Shockwave therapy can schedule next 3 weeks  Follow up in 4 weeks  Continue HEP

## 2023-06-04 ENCOUNTER — Ambulatory Visit: Payer: Self-pay | Admitting: Sports Medicine

## 2023-06-04 VITALS — Ht 65.0 in

## 2023-06-04 DIAGNOSIS — M79671 Pain in right foot: Secondary | ICD-10-CM

## 2023-06-04 DIAGNOSIS — M7731 Calcaneal spur, right foot: Secondary | ICD-10-CM

## 2023-06-04 DIAGNOSIS — G8929 Other chronic pain: Secondary | ICD-10-CM

## 2023-06-04 DIAGNOSIS — M7661 Achilles tendinitis, right leg: Secondary | ICD-10-CM

## 2023-06-04 NOTE — Progress Notes (Signed)
   Richardean Sale [facility] [address] [place] [address] Phone: [phone]   Extracorporeal Shockwave Therapy Note    Patient is being treated today with ECSWT. Informed consent was obtained and patient tolerated procedure well.   Therapy performed by Richardean Sale  Condition treated: Heel spur and Achilles tendinitis Treatment preset used: Heel spur Energy used: 120 mJ Frequency used: 10 hz Number of pulses: 2000 Head Size: Medium Treatment #1 of #3  Electronically signed by:  Marcelline Mates [facility] 10:21 AM 06/04/23

## 2023-06-11 ENCOUNTER — Ambulatory Visit (INDEPENDENT_AMBULATORY_CARE_PROVIDER_SITE_OTHER): Payer: Self-pay | Admitting: Sports Medicine

## 2023-06-11 DIAGNOSIS — M7661 Achilles tendinitis, right leg: Secondary | ICD-10-CM

## 2023-06-11 DIAGNOSIS — G8929 Other chronic pain: Secondary | ICD-10-CM

## 2023-06-11 DIAGNOSIS — M79671 Pain in right foot: Secondary | ICD-10-CM

## 2023-06-11 NOTE — Progress Notes (Signed)
   Richardean Sale [facility] [address] [place] [address] Phone: [phone]   Extracorporeal Shockwave Therapy Note    Patient is being treated today with ECSWT. Informed consent was obtained and patient tolerated procedure well.   Therapy performed by Richardean Sale  Condition treated: Right heel spur and Achilles tendinitis Treatment preset used: Heel spur Energy used: 120 mJ Frequency used: 10 hz Number of pulses: 2000 Head Size: Medium Treatment #2 of #3  Electronically signed by:  Marcelline Mates [facility] 10:12 AM 06/11/23

## 2023-06-13 ENCOUNTER — Telehealth: Payer: Self-pay | Admitting: Internal Medicine

## 2023-06-13 MED ORDER — FAMOTIDINE 40 MG PO TABS: 40.0000 mg | ORAL_TABLET | Freq: Every day | ORAL | 0 refills | Status: DC

## 2023-06-13 NOTE — Telephone Encounter (Signed)
Sent 30 day supply pt is due for annual in August../lmb

## 2023-06-13 NOTE — Telephone Encounter (Signed)
Prescription Request  06/13/2023  LOV: 07/05/2022  What is the name of the medication or equipment? famotidine (PEPCID) 40 MG tablet [   Have you contacted your pharmacy to request a refill? No   Which pharmacy would you like this sent to?  [id number] [place], [place] - [address] AT [facility] [address] [place] [address] Phone: [phone] Fax: [phone]    Patient notified that their request is being sent to the clinical staff for review and that they should receive a response within 2 business days.   Please advise at Mobile [phone] (mobile)

## 2023-06-18 ENCOUNTER — Ambulatory Visit: Payer: Self-pay | Admitting: Sports Medicine

## 2023-06-18 DIAGNOSIS — G8929 Other chronic pain: Secondary | ICD-10-CM

## 2023-06-18 DIAGNOSIS — M7731 Calcaneal spur, right foot: Secondary | ICD-10-CM

## 2023-06-18 DIAGNOSIS — M7661 Achilles tendinitis, right leg: Secondary | ICD-10-CM

## 2023-06-18 DIAGNOSIS — M79671 Pain in right foot: Secondary | ICD-10-CM

## 2023-06-18 NOTE — Progress Notes (Signed)
   Richardean Sale [facility] [address] [place] [address] Phone: [phone]   Extracorporeal Shockwave Therapy Note    Patient is being treated today with ECSWT. Informed consent was obtained and patient tolerated procedure well.   Therapy performed by Richardean Sale  Condition treated: Right heel spur and Achilles tendinitis Treatment preset used: Heel spur Energy used: 120 mJ Frequency used: 10 hz Number of pulses: 2000 Head Size: Medium Treatment #3 of #3  Electronically signed by:  Marcelline Mates [facility] 10:24 AM 06/18/23

## 2023-07-01 NOTE — Progress Notes (Unsigned)
    [name] D.[name] [address] [place] [address] Phone: [phone]   Assessment and Plan:     There are no diagnoses linked to this encounter.  ***   Pertinent previous records reviewed include ***   Follow Up: ***     Subjective:   I,  , am serving as a Neurosurgeon for Doctor Richardean Sale   Chief Complaint: left arm weakness    HPI:    02/26/23 Patient is a 62 year old female complaining of left arm weakness. Patient states that she has had pain for 7 weeks, tingling down her arm , pain under shoulder blade and armpit,intermittent pain in the arm down to the hand , tylenol and ibu for the pain muscle relaxer didn't help as well, decreased ROM , no MOI, no pain radiating up he neck, today is the firs day it felt okay,    03/26/2023 Patient states she is doing better with her arm , still some pain and  tingling but not as bad or as constant, she now has ankle pain and a knot on her right ankle has seen PCP for it was told to use ointment and that didn't work was told it was non surgical , she has pain , she is not able to walk    04/16/2023 Patient states its is getting better but not all the way healed    05/28/2023 Patient states that her arm is a lot better , ankle still hurts but it is a day to day thing   07/02/2023 Patient states    Relevant Historical Information: None pertinent  Additional pertinent review of systems negative.   Current Outpatient Medications:    allopurinol (ZYLOPRIM) 100 MG tablet, Take 1 tablet (100 mg total) by mouth daily., Disp: 90 tablet, Rfl: 3   b complex vitamins tablet, Take 1 tablet by mouth daily., Disp: 100 tablet, Rfl: 3   Cholecalciferol (VITAMIN D3) 50 MCG (2000 UT) capsule, Take 1 capsule (2,000 Units total) by mouth daily., Disp: 100 capsule, Rfl: 3   Cyanocobalamin (VITAMIN B-12) 1000 MCG SUBL, Place 1 tablet (1,000 mcg total) under the tongue daily., Disp: 100  tablet, Rfl: 3   cyclobenzaprine (FLEXERIL) 5 MG tablet, Take 1 tablet (5 mg total) by mouth at bedtime., Disp: 30 tablet, Rfl: 0   famotidine (PEPCID) 40 MG tablet, Take 1 tablet (40 mg total) by mouth daily. Annual appt due in Aug must see provider for future refills, Disp: 30 tablet, Rfl: 0   furosemide (LASIX) 20 MG tablet, TAKE 1 TABLET BY MOUTH EVERY DAY AS NEEDED, Disp: 90 tablet, Rfl: 1   meloxicam (MOBIC) 15 MG tablet, Take 1 tablet (15 mg total) by mouth daily., Disp: 30 tablet, Rfl: 0   potassium citrate (UROCIT-K) 5 MEQ (540 MG) SR tablet, Take 1 tablet (5 mEq total) by mouth 3 (three) times daily with meals., Disp: 90 tablet, Rfl: 11   Probiotic Product (PROBIOTIC-10 PO), Take by mouth., Disp: , Rfl:    Semaglutide-Weight Management (WEGOVY) 1 MG/0.5ML SOAJ, Inject 1 mg into the skin once a week., Disp: 2 mL, Rfl: 3   Objective:     There were no vitals filed for this visit.    There is no height or weight on file to calculate BMI.    Physical Exam:    ***   Electronically signed by:  [name] [name] 10:59 AM 07/01/23

## 2023-07-02 ENCOUNTER — Ambulatory Visit (INDEPENDENT_AMBULATORY_CARE_PROVIDER_SITE_OTHER): Payer: 59

## 2023-07-02 ENCOUNTER — Ambulatory Visit (INDEPENDENT_AMBULATORY_CARE_PROVIDER_SITE_OTHER): Payer: 59 | Admitting: Sports Medicine

## 2023-07-02 VITALS — BP 132/80 | HR 63 | Ht 65.0 in | Wt 248.0 lb

## 2023-07-02 DIAGNOSIS — M79675 Pain in left toe(s): Secondary | ICD-10-CM

## 2023-07-02 DIAGNOSIS — S92515A Nondisplaced fracture of proximal phalanx of left lesser toe(s), initial encounter for closed fracture: Secondary | ICD-10-CM | POA: Diagnosis not present

## 2023-07-02 DIAGNOSIS — M7731 Calcaneal spur, right foot: Secondary | ICD-10-CM

## 2023-07-02 DIAGNOSIS — M7661 Achilles tendinitis, right leg: Secondary | ICD-10-CM

## 2023-07-02 DIAGNOSIS — G8929 Other chronic pain: Secondary | ICD-10-CM

## 2023-07-02 DIAGNOSIS — M79671 Pain in right foot: Secondary | ICD-10-CM

## 2023-07-02 MED ORDER — MELOXICAM 15 MG PO TABS: 15.0000 mg | ORAL_TABLET | Freq: Every day | ORAL | 0 refills | Status: AC

## 2023-07-02 NOTE — Patient Instructions (Signed)
-   Start meloxicam 15 mg daily x2 weeks.  If still having pain after 2 weeks, complete 3rd-week of meloxicam. May use remaining meloxicam as needed once daily for pain control.  Do not to use additional NSAIDs while taking meloxicam.  May use Tylenol [phone] mg 2 to 3 times a day for breakthrough pain. Walking shoe Recommend wearing a boot on right foot  4 week follow up

## 2023-07-07 ENCOUNTER — Other Ambulatory Visit: Payer: Self-pay | Admitting: Internal Medicine

## 2023-07-15 ENCOUNTER — Ambulatory Visit (INDEPENDENT_AMBULATORY_CARE_PROVIDER_SITE_OTHER): Payer: 59 | Admitting: Internal Medicine

## 2023-07-15 ENCOUNTER — Encounter: Payer: Self-pay | Admitting: Internal Medicine

## 2023-07-15 VITALS — BP 126/84 | HR 65 | Temp 97.8°F | Ht 65.0 in | Wt 248.0 lb

## 2023-07-15 DIAGNOSIS — U071 COVID-19: Secondary | ICD-10-CM

## 2023-07-15 DIAGNOSIS — E559 Vitamin D deficiency, unspecified: Secondary | ICD-10-CM

## 2023-07-15 DIAGNOSIS — J3089 Other allergic rhinitis: Secondary | ICD-10-CM

## 2023-07-15 LAB — POC COVID19 BINAXNOW: SARS Coronavirus 2 Ag: POSITIVE — AB

## 2023-07-15 MED ORDER — HYDROCODONE BIT-HOMATROP MBR 5-1.5 MG/5ML PO SOLN: 5.0000 mL | Freq: Four times a day (QID) | ORAL | 0 refills | Status: AC | PRN

## 2023-07-15 MED ORDER — NIRMATRELVIR/RITONAVIR (PAXLOVID) TABLET (RENAL DOSING): 2.0000 | ORAL_TABLET | Freq: Two times a day (BID) | ORAL | 0 refills | Status: AC

## 2023-07-15 MED ORDER — AZITHROMYCIN 250 MG PO TABS: ORAL_TABLET | ORAL | 0 refills | Status: DC

## 2023-07-15 MED ORDER — FAMOTIDINE 40 MG PO TABS: 40.0000 mg | ORAL_TABLET | Freq: Every day | ORAL | 1 refills | Status: DC

## 2023-07-15 NOTE — Patient Instructions (Signed)
For a mild COVID-19 case - take zinc 50 mg a day for 1 week, vitamin C 1000 mg daily for 1 week, vitamin D2 50,000 units weekly for 2 months (unless  taking vitamin D daily already), an antioxidant Quercetin 500 mg twice a day for 1 week (if you can get it quick enough). Take Allegra or Benadryl.  Maintain good oral hydration and take Tylenol for high fever.  Call if problems. Isolate for 5 days, then wear a mask for 5 days per CDC.  

## 2023-07-15 NOTE — Progress Notes (Signed)
Subjective:  Patient ID: Latasha Hicks, female    DOB: [birth year]  Age: 62 y.o. MRN: [id number]  CC: Medication Refill and Labs Only (Would like to discuss if there is a blood test to check for leukemia due to family hx)   HPI Latasha Hicks presents for GERD C/o URI sx's - ST,   Outpatient Medications Prior to Visit  Medication Sig Dispense Refill   allopurinol (ZYLOPRIM) 100 MG tablet Take 1 tablet (100 mg total) by mouth daily. 90 tablet 3   b complex vitamins tablet Take 1 tablet by mouth daily. 100 tablet 3   Cholecalciferol (VITAMIN D3) 50 MCG (2000 UT) capsule Take 1 capsule (2,000 Units total) by mouth daily. 100 capsule 3   Cyanocobalamin (VITAMIN B-12) 1000 MCG SUBL Place 1 tablet (1,000 mcg total) under the tongue daily. 100 tablet 3   cyclobenzaprine (FLEXERIL) 5 MG tablet Take 1 tablet (5 mg total) by mouth at bedtime. 30 tablet 0   furosemide (LASIX) 20 MG tablet TAKE 1 TABLET BY MOUTH EVERY DAY AS NEEDED 90 tablet 1   meloxicam (MOBIC) 15 MG tablet Take 1 tablet (15 mg total) by mouth daily. 30 tablet 0   potassium citrate (UROCIT-K) 5 MEQ (540 MG) SR tablet Take 1 tablet (5 mEq total) by mouth 3 (three) times daily with meals. 90 tablet 11   Probiotic Product (PROBIOTIC-10 PO) Take by mouth.     famotidine (PEPCID) 40 MG tablet Take 1 tablet (40 mg total) by mouth daily. 90 tablet 0   meloxicam (MOBIC) 15 MG tablet Take 1 tablet (15 mg total) by mouth daily. 30 tablet 0   Semaglutide-Weight Management (WEGOVY) 1 MG/0.5ML SOAJ Inject 1 mg into the skin once a week. 2 mL 3   No facility-administered medications prior to visit.    ROS: Review of Systems  Constitutional:  Positive for chills and fatigue. Negative for activity change, appetite change and unexpected weight change.  HENT:  Positive for congestion, rhinorrhea, sinus pressure, sinus pain and sore throat. Negative for mouth sores.   Eyes:  Negative for visual disturbance.  Respiratory:  Positive for  cough. Negative for chest tightness.   Gastrointestinal:  Negative for abdominal pain and nausea.  Genitourinary:  Negative for difficulty urinating, frequency and vaginal pain.  Musculoskeletal:  Negative for back pain and gait problem.  Skin:  Negative for pallor and rash.  Neurological:  Negative for dizziness, tremors, weakness, numbness and headaches.  Psychiatric/Behavioral:  Negative for confusion and sleep disturbance.     Objective:  BP 126/84 (BP Location: Left Arm, Patient Position: Sitting, Cuff Size: Large)   Pulse 65   Temp 97.8 F (36.6 C) (Temporal)   Ht 5\' 5"  (1.651 m)   Wt 248 lb (112.5 kg)   SpO2 98%   BMI 41.27 kg/m   BP Readings from Last 3 Encounters:  07/15/23 126/84  07/02/23 132/80  05/28/23 132/78    Wt Readings from Last 3 Encounters:  07/15/23 248 lb (112.5 kg)  07/02/23 248 lb (112.5 kg)  05/28/23 248 lb (112.5 kg)    Physical Exam Constitutional:      General: She is not in acute distress.    Appearance: She is well-developed. She is obese.  HENT:     Head: Normocephalic.     Right Ear: External ear normal.     Left Ear: External ear normal.     Nose: Congestion and rhinorrhea present.     Mouth/Throat:  Pharynx: Posterior oropharyngeal erythema present. No oropharyngeal exudate.  Eyes:     General:        Right eye: No discharge.        Left eye: No discharge.     Conjunctiva/sclera: Conjunctivae normal.     Pupils: Pupils are equal, round, and reactive to light.  Neck:     Thyroid: No thyromegaly.     Vascular: No JVD.     Trachea: No tracheal deviation.  Cardiovascular:     Rate and Rhythm: Normal rate and regular rhythm.     Heart sounds: Normal heart sounds.  Pulmonary:     Effort: No respiratory distress.     Breath sounds: No stridor. No wheezing.  Abdominal:     General: Bowel sounds are normal. There is no distension.     Palpations: Abdomen is soft. There is no mass.     Tenderness: There is no abdominal  tenderness. There is no guarding or rebound.  Musculoskeletal:        General: No tenderness.     Cervical back: Normal range of motion and neck supple. No rigidity.  Lymphadenopathy:     Cervical: No cervical adenopathy.  Skin:    Findings: No erythema or rash.  Neurological:     Cranial Nerves: No cranial nerve deficit.     Motor: No abnormal muscle tone.     Coordination: Coordination normal.     Deep Tendon Reflexes: Reflexes normal.  Psychiatric:        Behavior: Behavior normal.        Thought Content: Thought content normal.        Judgment: Judgment normal.     Lab Results  Component Value Date   WBC 7.6 05/24/2022   HGB 14.6 05/24/2022   HCT 45.3 05/24/2022   PLT 246.0 05/24/2022   GLUCOSE 108 (H) 05/24/2022   CHOL 234 (H) 05/24/2022   TRIG 115.0 05/24/2022   HDL 49.50 05/24/2022   LDLDIRECT 181.4 05/01/2013   LDLCALC 162 (H) 05/24/2022   ALT 36 (H) 05/24/2022   AST 32 05/24/2022   NA 138 05/24/2022   K 4.3 05/24/2022   CL 107 05/24/2022   CREATININE 0.86 05/24/2022   BUN 18 05/24/2022   CO2 25 05/24/2022   TSH 2.82 05/24/2022   INR 0.99 09/19/2018   HGBA1C 6.0 02/06/2008    VAS [place] LOWER EXTREMITY VENOUS (DVT)  Result Date: 05/28/2022  Lower Venous DVT Study Patient Name:  Latasha Hicks  Date of Exam:   05/28/2022 Medical Rec #: [id number]           Accession #:    [id number] Date of Birth: May 05, 1961          Patient Gender: F Patient Age:   40 years Exam Location:  [facility] Procedure:      VAS [place] LOWER EXTREMITY VENOUS (DVT) Referring Phys: Macarthur Critchley Charlynn Salih --------------------------------------------------------------------------------  Indications: Swelling, and Slightly elevated d-dimer.  Comparison Study: No previous exam noted. Performing Technologist: Magdalene River BS, RVT  Examination Guidelines: A complete evaluation includes B-mode imaging, spectral Doppler, color Doppler, and power Doppler as needed of all accessible portions of each  vessel. Bilateral testing is considered an integral part of a complete examination. Limited examinations for reoccurring indications may be performed as noted. The reflux portion of the exam is performed with the patient in reverse Trendelenburg.  +---------+---------------+---------+-----------+----------+--------------+ RIGHT    CompressibilityPhasicitySpontaneityPropertiesThrombus Aging +---------+---------------+---------+-----------+----------+--------------+ CFV      Full  Yes      Yes                                 +---------+---------------+---------+-----------+----------+--------------+ SFJ      Full                                                        +---------+---------------+---------+-----------+----------+--------------+ FV Prox  Full                                                        +---------+---------------+---------+-----------+----------+--------------+ FV Mid   Full                                                        +---------+---------------+---------+-----------+----------+--------------+ FV DistalFull                                                        +---------+---------------+---------+-----------+----------+--------------+ PFV      Full                                                        +---------+---------------+---------+-----------+----------+--------------+ POP      Full           Yes      Yes                                 +---------+---------------+---------+-----------+----------+--------------+ PTV      Full                                                        +---------+---------------+---------+-----------+----------+--------------+ PERO     Full                                                        +---------+---------------+---------+-----------+----------+--------------+   +---------+---------------+---------+-----------+----------+--------------+ LEFT      CompressibilityPhasicitySpontaneityPropertiesThrombus Aging +---------+---------------+---------+-----------+----------+--------------+ CFV      Full           Yes      Yes                                 +---------+---------------+---------+-----------+----------+--------------+ SFJ  Full                                                        +---------+---------------+---------+-----------+----------+--------------+ FV Prox  Full                                                        +---------+---------------+---------+-----------+----------+--------------+ FV Mid   Full                                                        +---------+---------------+---------+-----------+----------+--------------+ FV DistalFull                                                        +---------+---------------+---------+-----------+----------+--------------+ PFV      Full                                                        +---------+---------------+---------+-----------+----------+--------------+ POP      Full           Yes      Yes                                 +---------+---------------+---------+-----------+----------+--------------+ PTV      Full                                                        +---------+---------------+---------+-----------+----------+--------------+ PERO     Full                                                        +---------+---------------+---------+-----------+----------+--------------+     Summary: BILATERAL: - No evidence of deep vein thrombosis seen in the lower extremities, bilaterally. -No evidence of popliteal cyst, bilaterally.   *See table(s) above for measurements and observations. Electronically signed by Waverly Ferrari MD on 05/28/2022 at 4:26:32 PM.    Final     Assessment & Plan:   Problem List Items Addressed This Visit     Vitamin D deficiency    Continue with vitamin D      Allergic  rhinitis    Allegra po      COVID-19 - Primary    New Start Paxlovid Zpack if worse Hycodan syrup      Relevant Medications  nirmatrelvir/ritonavir, renal dosing, (PAXLOVID) 10 x 150 MG & 10 x 100MG  TABS   azithromycin (ZITHROMAX Z-PAK) 250 MG tablet   Other Relevant Orders   POC COVID-19 (Completed)      Meds ordered this encounter  Medications   famotidine (PEPCID) 40 MG tablet    Sig: Take 1 tablet (40 mg total) by mouth daily.    Dispense:  90 tablet    Refill:  1   nirmatrelvir/ritonavir, renal dosing, (PAXLOVID) 10 x 150 MG & 10 x 100MG  TABS    Sig: Take 2 tablets by mouth 2 (two) times daily for 5 days. (Take nirmatrelvir 150 mg one tablet twice daily for 5 days and ritonavir 100 mg one tablet twice daily for 5 days) Patient GFR is 73    Dispense:  20 tablet    Refill:  0   azithromycin (ZITHROMAX Z-PAK) 250 MG tablet    Sig: As directed    Dispense:  6 tablet    Refill:  0   HYDROcodone bit-homatropine (HYCODAN) 5-1.5 MG/5ML syrup    Sig: Take 5 mLs by mouth every 6 (six) hours as needed for up to 10 days for cough.    Dispense:  240 mL    Refill:  0      Follow-up: Return in about 6 months (around 01/15/2024) for Wellness Exam.  Sonda Primes, MD

## 2023-07-15 NOTE — Assessment & Plan Note (Signed)
Allegra po

## 2023-07-15 NOTE — Assessment & Plan Note (Signed)
New Start Paxlovid Zpack if worse Hycodan syrup

## 2023-07-15 NOTE — Assessment & Plan Note (Signed)
Continue with vitamin D 

## 2023-07-31 ENCOUNTER — Other Ambulatory Visit: Payer: Self-pay | Admitting: Sports Medicine

## 2023-08-07 ENCOUNTER — Ambulatory Visit: Payer: 59 | Admitting: Sports Medicine

## 2023-08-16 ENCOUNTER — Other Ambulatory Visit: Payer: Self-pay | Admitting: Sports Medicine

## 2023-09-03 ENCOUNTER — Other Ambulatory Visit: Payer: Self-pay | Admitting: Sports Medicine

## 2024-04-01 ENCOUNTER — Other Ambulatory Visit: Payer: Self-pay | Admitting: Internal Medicine

## 2024-05-11 ENCOUNTER — Ambulatory Visit: Payer: Self-pay

## 2024-05-11 NOTE — Telephone Encounter (Signed)
 FYI Only or Action Required?: FYI only for provider  Patient was last seen in primary care on 07/15/2023 by Plotnikov, Oakley Bellman, MD. Called Nurse Triage reporting Cough. Symptoms began 10 days ago. Interventions attempted: OTC medications: Mucinex, tylenol  and Rest, hydration, or home remedies. Symptoms are: unchanged.  Triage Disposition: See Physician Within 24 Hours-patient is recommended to Urgent Care as no availability to PCP office.   Patient/caregiver understands and will follow disposition?: Yes  Copied from CRM [id number]. Topic: Clinical - Red Word Triage >> May 11, 2024  1:00 PM Chuck Crater wrote: [facility] that prompted transfer to Nurse Triage: Patient is having upper respiratory problems and has been coughing for 10 days. She is weak, dizzy, and chest rattles when coughing.   ----------------------------------------------------------------------- From previous Reason for Contact - Scheduling: Patient/patient representative is calling to schedule an appointment. Refer to attachments for appointment information. Reason for Disposition  SEVERE coughing spells (e.g., whooping sound after coughing, vomiting after coughing)  Answer Assessment - Initial Assessment Questions 1. ONSET: When did the cough begin?      10 days ago 2. SEVERITY: How bad is the cough today?      8 out of 10 3. SPUTUM: Describe the color of your sputum (none, dry cough; clear, white, yellow, green)     yellow 4. HEMOPTYSIS: Are you coughing up any blood? If so ask: How much? (flecks, streaks, tablespoons, etc.)     no 5. DIFFICULTY BREATHING: Are you having difficulty breathing? If Yes, ask: How bad is it? (e.g., mild, moderate, severe)    - MILD: No SOB at rest, mild SOB with walking, speaks normally in sentences, can lie down, no retractions, pulse < 100.    - MODERATE: SOB at rest, SOB with minimal exertion and prefers to sit, cannot lie down flat, speaks in phrases, mild retractions,  audible wheezing, pulse 100-120.    - SEVERE: Very SOB at rest, speaks in single words, struggling to breathe, sitting hunched forward, retractions, pulse > 120      no 6. FEVER: Do you have a fever? If Yes, ask: What is your temperature, how was it measured, and when did it start?     No fever currently 7. CARDIAC HISTORY: Do you have any history of heart disease? (e.g., heart attack, congestive heart failure)      no 8. LUNG HISTORY: Do you have any history of lung disease?  (e.g., pulmonary embolus, asthma, emphysema)     no 9. PE RISK FACTORS: Do you have a history of blood clots? (or: recent major surgery, recent prolonged travel, bedridden)     no 10. OTHER SYMPTOMS: Do you have any other symptoms? (e.g., runny nose, wheezing, chest pain)       Dizziness, weakness 12. TRAVEL: Have you traveled out of the country in the last month? (e.g., travel history, exposures)       no  Protocols used: Cough - Acute Non-Productive-A-AH

## 2024-05-12 ENCOUNTER — Ambulatory Visit: Payer: Self-pay | Admitting: Urgent Care

## 2024-05-12 ENCOUNTER — Ambulatory Visit
Admission: EM | Admit: 2024-05-12 | Discharge: 2024-05-12 | Disposition: A | Discharge status: Home / Self Care | Source: Home / Self Care

## 2024-05-12 ENCOUNTER — Ambulatory Visit (INDEPENDENT_AMBULATORY_CARE_PROVIDER_SITE_OTHER)

## 2024-05-12 DIAGNOSIS — J209 Acute bronchitis, unspecified: Secondary | ICD-10-CM

## 2024-05-12 DIAGNOSIS — J309 Allergic rhinitis, unspecified: Secondary | ICD-10-CM

## 2024-05-12 DIAGNOSIS — R0989 Other specified symptoms and signs involving the circulatory and respiratory systems: Secondary | ICD-10-CM | POA: Diagnosis not present

## 2024-05-12 MED ORDER — AZITHROMYCIN 250 MG PO TABS: ORAL_TABLET | ORAL | 0 refills | Status: DC

## 2024-05-12 MED ORDER — PROMETHAZINE-DM 6.25-15 MG/5ML PO SYRP: 5.0000 mL | ORAL_SOLUTION | Freq: Three times a day (TID) | ORAL | 0 refills | Status: DC | PRN

## 2024-05-12 NOTE — ED Provider Notes (Signed)
 [facility]  Note:  This document was prepared using Conservation officer, historic buildings and may include unintentional dictation errors.  MRN: [id number] DOB: 08-27-1961  Subjective:   Latasha Hicks is a 63 y.o. female presenting for 12-day history of acute onset persistent productive cough with chest congestion and chest discomfort.  Patient has had significant malaise and fatigue.  Has a history of allergic rhinitis and some mild postnasal drainage.  Started using pseudoephedrine for this.  No current facility-administered medications for this encounter.  Current Outpatient Medications:    allopurinol  (ZYLOPRIM ) 100 MG tablet, Take 1 tablet (100 mg total) by mouth daily., Disp: 90 tablet, Rfl: 3   azithromycin  (ZITHROMAX  Z-PAK) 250 MG tablet, As directed, Disp: 6 tablet, Rfl: 0   b complex vitamins tablet, Take 1 tablet by mouth daily., Disp: 100 tablet, Rfl: 3   Cholecalciferol (VITAMIN D3) 50 MCG (2000 UT) capsule, Take 1 capsule (2,000 Units total) by mouth daily., Disp: 100 capsule, Rfl: 3   Cyanocobalamin  (VITAMIN B-12) 1000 MCG SUBL, Place 1 tablet (1,000 mcg total) under the tongue daily., Disp: 100 tablet, Rfl: 3   cyclobenzaprine  (FLEXERIL ) 5 MG tablet, Take 1 tablet (5 mg total) by mouth at bedtime., Disp: 30 tablet, Rfl: 0   famotidine  (PEPCID ) 40 MG tablet, TAKE 1 TABLET BY MOUTH EVERY DAY, Disp: 90 tablet, Rfl: 1   furosemide  (LASIX ) 20 MG tablet, TAKE 1 TABLET BY MOUTH EVERY DAY AS NEEDED, Disp: 90 tablet, Rfl: 1   meloxicam  (MOBIC ) 15 MG tablet, Take 1 tablet (15 mg total) by mouth daily., Disp: 30 tablet, Rfl: 0   potassium citrate  (UROCIT-K ) 5 MEQ (540 MG) SR tablet, Take 1 tablet (5 mEq total) by mouth 3 (three) times daily with meals., Disp: 90 tablet, Rfl: 11   Probiotic Product (PROBIOTIC-10 PO), Take by mouth., Disp: , Rfl:    Allergies  Allergen Reactions   Clarithromycin     REACTION: gi upset   Tetanus Toxoids Other (See Comments)     Right arm large erythem reaction spontaneously improved    Past Medical History:  Diagnosis Date   Allergic rhinitis    Complication of anesthesia    hard to wake up in past   COVID-19 09/2021   Endometriosis    Hematuria    d/t kidney stones    History of kidney stones    History of sleep apnea    NO OSA SINCE WEIGHT LOSS SURGERY AND WEIGHT LOSS 2009   Hydronephrosis    Kidney stones    Neck pain 07/05/2016   Osteoarthritis    Personal history of colonic polyps    Question accuracy, patient report only.   Sleep apnea    Urinary incontinence    VENOUS INSUFFICIENCY, CHRONIC 07/05/2007   Annotation: LE Qualifier: Diagnosis of  By: Dance CMA (AAMA), Kim     Vitamin D  deficiency      Past Surgical History:  Procedure Laterality Date   ABDOMINAL SURGERY     ACHILLES TENDON SURGERY Left 12/2017   BREAST CYST EXCISION Left    COLONOSCOPY N/A 09/04/2013   Procedure: COLONOSCOPY;  Surgeon: Claudette Cue, MD;  Location: [facility] ENDOSCOPY;  Service: Endoscopy;  Laterality: N/A;   COLONOSCOPY W/ POLYPECTOMY     CYSTOSCOPY WITH RETROGRADE PYELOGRAM, URETEROSCOPY AND STENT PLACEMENT Left 01/30/2019   Procedure: CYSTOSCOPY WITH RETROGRADE PYELOGRAM, URETEROSCOPY AND STENT PLACEMENT;  Surgeon: Marco Severs, MD;  Location: [facility];  Service: Urology;  Laterality: Left;  1 HR   CYSTOSCOPY WITH RETROGRADE PYELOGRAM, URETEROSCOPY AND STENT PLACEMENT Bilateral 04/19/2020   Procedure: CYSTOSCOPY WITH RETROGRADE PYELOGRAM, URETEROSCOPY AND STENT PLACEMENT;  Surgeon: Marco Severs, MD;  Location: [facility];  Service: Urology;  Laterality: Bilateral;  2 HRS   CYSTOSCOPY WITH RETROGRADE PYELOGRAM, URETEROSCOPY AND STENT PLACEMENT N/A 05/17/2020   Procedure: CYSTOSCOPY WITH RIGHT RETROGRADE PYELOGRAM, INTRAOPERATIVE FLUOROSCOPY, under one hour, with INTERPRETATION, RIGHT DIAGNOSTIC URETEROSCOPY AND BILATERAL URETERAL STENT REMOVAL;  Surgeon: Marco Severs, MD;   Location: [facility];  Service: Urology;  Laterality: N/A;  30 MINS   DILATION AND CURETTAGE OF UTERUS     HIATAL HERNIA REPAIR     HOLMIUM LASER APPLICATION Left 01/30/2019   Procedure: HOLMIUM LASER APPLICATION;  Surgeon: Marco Severs, MD;  Location: [facility];  Service: Urology;  Laterality: Left;   HOLMIUM LASER APPLICATION Bilateral 04/19/2020   Procedure: HOLMIUM LASER APPLICATION;  Surgeon: Marco Severs, MD;  Location: [facility];  Service: Urology;  Laterality: Bilateral;   lap band replacement  2012   LAPAROSCOPIC CHOLECYSTECTOMY     LAPAROSCOPIC GASTRIC BANDING  2009   TOTAL KNEE ARTHROPLASTY Left 09/29/2018   Procedure: LEFT TOTAL KNEE ARTHROPLASTY;  Surgeon: Wes Hamman, MD;  Location: [facility];  Service: Orthopedics;  Laterality: Left;   VAGINAL HYSTERECTOMY     partial    Family History  Problem Relation Age of Onset   Diabetes Mother    Cervical cancer Mother    Hypertension Mother    Rectal cancer Neg Hx    Stomach cancer Neg Hx    Heart disease Neg Hx    Colon cancer Neg Hx     Social History   Tobacco Use   Smoking status: Never   Smokeless tobacco: Never  Vaping Use   Vaping status: Never Used  Substance Use Topics   Alcohol use: Yes    Comment: occ   Drug use: No    ROS   Objective:   Vitals: BP (P) 119/80 (BP Location: Right Arm)   Pulse (P) 70   Temp (P) 98.9 F (37.2 C) (Oral)   Resp (P) 16   SpO2 (P) 94%   Physical Exam Constitutional:      General: She is not in acute distress.    Appearance: Normal appearance. She is well-developed and normal weight. She is not ill-appearing, toxic-appearing or diaphoretic.  HENT:     Head: Normocephalic and atraumatic.     Right Ear: Tympanic membrane, ear canal and external ear normal. No drainage or tenderness. No middle ear effusion. There is no impacted cerumen. Tympanic membrane is not erythematous or bulging.     Left Ear: Tympanic membrane, ear canal  and external ear normal. No drainage or tenderness.  No middle ear effusion. There is no impacted cerumen. Tympanic membrane is not erythematous or bulging.     Nose: Nose normal. No congestion or rhinorrhea.     Mouth/Throat:     Mouth: Mucous membranes are moist. No oral lesions.     Pharynx: No pharyngeal swelling, oropharyngeal exudate, posterior oropharyngeal erythema or uvula swelling.     Tonsils: No tonsillar exudate or tonsillar abscesses.   Eyes:     General: No scleral icterus.       Right eye: No discharge.        Left eye: No discharge.     Extraocular Movements: Extraocular movements intact.     Right eye: Normal  extraocular motion.     Left eye: Normal extraocular motion.     Conjunctiva/sclera: Conjunctivae normal.    Cardiovascular:     Rate and Rhythm: Normal rate and regular rhythm.     Heart sounds: Normal heart sounds. No murmur heard.    No friction rub. No gallop.  Pulmonary:     Effort: Pulmonary effort is normal. No respiratory distress.     Breath sounds: No stridor. Rhonchi (trace over mid lung fields bilaterally) present. No wheezing or rales.  Chest:     Chest wall: No tenderness.   Musculoskeletal:     Cervical back: Normal range of motion and neck supple.  Lymphadenopathy:     Cervical: No cervical adenopathy.   Skin:    General: Skin is warm and dry.   Neurological:     General: No focal deficit present.     Mental Status: She is alert and oriented to person, place, and time.   Psychiatric:        Mood and Affect: Mood normal.        Behavior: Behavior normal.      Assessment and Plan :   PDMP not reviewed this encounter.  1. Acute bronchitis, unspecified organism   2. Chest congestion   3. Allergic rhinitis, unspecified seasonality, unspecified trigger    X-ray over-read was pending at time of discharge, recommended follow up with only abnormal results. Otherwise will not call for negative over-read. Patient was in agreement.   Recommended managing for acute bronchitis with azithromycin .  Use supportive care otherwise.  Will defer steroid use given her history of GI bleeding, gastric banding.  Counseled patient on potential for adverse effects with medications prescribed/recommended today, ER and return-to-clinic precautions discussed, patient verbalized understanding.    Adolph Hoop, [place] 05/12/24 1445

## 2024-05-12 NOTE — ED Triage Notes (Signed)
 Pt c/o cough, chest congestion x 12 days-NAD-steady gait

## 2024-07-20 ENCOUNTER — Ambulatory Visit
Admission: EM | Admit: 2024-07-20 | Discharge: 2024-07-20 | Disposition: A | Discharge status: Home / Self Care | Attending: Family Medicine | Admitting: Family Medicine

## 2024-07-20 DIAGNOSIS — B9789 Other viral agents as the cause of diseases classified elsewhere: Secondary | ICD-10-CM

## 2024-07-20 DIAGNOSIS — J309 Allergic rhinitis, unspecified: Secondary | ICD-10-CM | POA: Diagnosis not present

## 2024-07-20 DIAGNOSIS — J988 Other specified respiratory disorders: Secondary | ICD-10-CM | POA: Diagnosis not present

## 2024-07-20 MED ORDER — OMEPRAZOLE 20 MG PO CPDR: 20.0000 mg | DELAYED_RELEASE_CAPSULE | Freq: Every day | ORAL | 0 refills | Status: DC

## 2024-07-20 MED ORDER — FAMOTIDINE 20 MG PO TABS: 20.0000 mg | ORAL_TABLET | Freq: Two times a day (BID) | ORAL | 0 refills | Status: DC

## 2024-07-20 MED ORDER — PREDNISONE 10 MG PO TABS: 30.0000 mg | ORAL_TABLET | Freq: Every day | ORAL | 0 refills | Status: DC

## 2024-07-20 NOTE — ED Provider Notes (Signed)
 [facility]  Note:  This document was prepared using Conservation officer, historic buildings and may include unintentional dictation errors.  MRN: [id number] DOB: 1961/06/03  Subjective:   Latasha Hicks is a 63 y.o. female presenting for 4 day history of productive cough, sinus and chest congestion.  No fever, chest pain, wheezing, shob. Has been taking Sudafed. Has allergies, does not take her allergy medications. Patient last had a similar episode 05/12/2024.  Chest x-ray was negative for that visit, underwent a course of azithromycin  for acute bacterial bronchitis. No asthma. No smoking of any kind including cigarettes, cigars, vaping, marijuana use.  Does not want COVID testing. Has history of gastric banding. Has a remote history of GI bleed. Has taken some steroids since then without any issues.   No current facility-administered medications for this encounter.  Current Outpatient Medications:    allopurinol  (ZYLOPRIM ) 100 MG tablet, Take 1 tablet (100 mg total) by mouth daily., Disp: 90 tablet, Rfl: 3   azithromycin  (ZITHROMAX ) 250 MG tablet, Day 1: take 2 tablets. Day 2-5: Take 1 tablet daily., Disp: 6 tablet, Rfl: 0   b complex vitamins tablet, Take 1 tablet by mouth daily., Disp: 100 tablet, Rfl: 3   Cholecalciferol (VITAMIN D3) 50 MCG (2000 UT) capsule, Take 1 capsule (2,000 Units total) by mouth daily., Disp: 100 capsule, Rfl: 3   Cyanocobalamin  (VITAMIN B-12) 1000 MCG SUBL, Place 1 tablet (1,000 mcg total) under the tongue daily., Disp: 100 tablet, Rfl: 3   cyclobenzaprine  (FLEXERIL ) 5 MG tablet, Take 1 tablet (5 mg total) by mouth at bedtime., Disp: 30 tablet, Rfl: 0   famotidine  (PEPCID ) 40 MG tablet, TAKE 1 TABLET BY MOUTH EVERY DAY, Disp: 90 tablet, Rfl: 1   furosemide  (LASIX ) 20 MG tablet, TAKE 1 TABLET BY MOUTH EVERY DAY AS NEEDED, Disp: 90 tablet, Rfl: 1   meloxicam  (MOBIC ) 15 MG tablet, Take 1 tablet (15 mg total) by mouth daily., Disp: 30 tablet,  Rfl: 0   potassium citrate  (UROCIT-K ) 5 MEQ (540 MG) SR tablet, Take 1 tablet (5 mEq total) by mouth 3 (three) times daily with meals., Disp: 90 tablet, Rfl: 11   Probiotic Product (PROBIOTIC-10 PO), Take by mouth., Disp: , Rfl:    promethazine -dextromethorphan (PROMETHAZINE -DM) 6.25-15 MG/5ML syrup, Take 5 mLs by mouth 3 (three) times daily as needed for cough., Disp: 200 mL, Rfl: 0   Allergies  Allergen Reactions   Clarithromycin     REACTION: gi upset   Tetanus Toxoids Other (See Comments)    Right arm large erythem reaction spontaneously improved    Past Medical History:  Diagnosis Date   Allergic rhinitis    Complication of anesthesia    hard to wake up in past   COVID-19 09/2021   Endometriosis    Hematuria    d/t kidney stones    History of kidney stones    History of sleep apnea    NO OSA SINCE WEIGHT LOSS SURGERY AND WEIGHT LOSS 2009   Hydronephrosis    Kidney stones    Neck pain 07/05/2016   Osteoarthritis    Personal history of colonic polyps    Question accuracy, patient report only.   Sleep apnea    Urinary incontinence    VENOUS INSUFFICIENCY, CHRONIC 07/05/2007   Annotation: LE Qualifier: Diagnosis of  By: Dance CMA (AAMA), Kim     Vitamin D  deficiency      Past Surgical History:  Procedure Laterality Date   ABDOMINAL SURGERY  ACHILLES TENDON SURGERY Left 12/2017   BREAST CYST EXCISION Left    COLONOSCOPY N/A 09/04/2013   Procedure: COLONOSCOPY;  Surgeon: Lamar JONETTA Aho, MD;  Location: [facility] ENDOSCOPY;  Service: Endoscopy;  Laterality: N/A;   COLONOSCOPY W/ POLYPECTOMY     CYSTOSCOPY WITH RETROGRADE PYELOGRAM, URETEROSCOPY AND STENT PLACEMENT Left 01/30/2019   Procedure: CYSTOSCOPY WITH RETROGRADE PYELOGRAM, URETEROSCOPY AND STENT PLACEMENT;  Surgeon: Sherrilee Belvie CROME, MD;  Location: [facility];  Service: Urology;  Laterality: Left;  1 HR   CYSTOSCOPY WITH RETROGRADE PYELOGRAM, URETEROSCOPY AND STENT PLACEMENT Bilateral 04/19/2020   Procedure: CYSTOSCOPY  WITH RETROGRADE PYELOGRAM, URETEROSCOPY AND STENT PLACEMENT;  Surgeon: Sherrilee Belvie CROME, MD;  Location: [facility];  Service: Urology;  Laterality: Bilateral;  2 HRS   CYSTOSCOPY WITH RETROGRADE PYELOGRAM, URETEROSCOPY AND STENT PLACEMENT N/A 05/17/2020   Procedure: CYSTOSCOPY WITH RIGHT RETROGRADE PYELOGRAM, INTRAOPERATIVE FLUOROSCOPY, under one hour, with INTERPRETATION, RIGHT DIAGNOSTIC URETEROSCOPY AND BILATERAL URETERAL STENT REMOVAL;  Surgeon: Sherrilee Belvie CROME, MD;  Location: [facility];  Service: Urology;  Laterality: N/A;  30 MINS   DILATION AND CURETTAGE OF UTERUS     HIATAL HERNIA REPAIR     HOLMIUM LASER APPLICATION Left 01/30/2019   Procedure: HOLMIUM LASER APPLICATION;  Surgeon: Sherrilee Belvie CROME, MD;  Location: [facility];  Service: Urology;  Laterality: Left;   HOLMIUM LASER APPLICATION Bilateral 04/19/2020   Procedure: HOLMIUM LASER APPLICATION;  Surgeon: Sherrilee Belvie CROME, MD;  Location: [facility];  Service: Urology;  Laterality: Bilateral;   lap band replacement  2012   LAPAROSCOPIC CHOLECYSTECTOMY     LAPAROSCOPIC GASTRIC BANDING  2009   TOTAL KNEE ARTHROPLASTY Left 09/29/2018   Procedure: LEFT TOTAL KNEE ARTHROPLASTY;  Surgeon: Jerri Kay HERO, MD;  Location: [facility];  Service: Orthopedics;  Laterality: Left;   VAGINAL HYSTERECTOMY     partial    Family History  Problem Relation Age of Onset   Diabetes Mother    Cervical cancer Mother    Hypertension Mother    Rectal cancer Neg Hx    Stomach cancer Neg Hx    Heart disease Neg Hx    Colon cancer Neg Hx     Social History   Tobacco Use   Smoking status: Never   Smokeless tobacco: Never  Vaping Use   Vaping status: Never Used  Substance Use Topics   Alcohol use: Yes    Comment: occ   Drug use: No    ROS   Objective:   Vitals: BP 116/78 (BP Location: Left Arm)   Pulse 65   Temp 98.7 F (37.1 C) (Oral)   Resp 20   SpO2 97%   Physical  Exam Constitutional:      General: She is not in acute distress.    Appearance: Normal appearance. She is well-developed and normal weight. She is not ill-appearing, toxic-appearing or diaphoretic.  HENT:     Head: Normocephalic and atraumatic.     Right Ear: Tympanic membrane, ear canal and external ear normal. No drainage or tenderness. No middle ear effusion. There is no impacted cerumen. Tympanic membrane is not erythematous or bulging.     Left Ear: Tympanic membrane, ear canal and external ear normal. No drainage or tenderness.  No middle ear effusion. There is no impacted cerumen. Tympanic membrane is not erythematous or bulging.     Nose: Congestion present. No rhinorrhea.     Mouth/Throat:     Mouth: Mucous membranes are moist. No  oral lesions.     Pharynx: No pharyngeal swelling, oropharyngeal exudate, posterior oropharyngeal erythema or uvula swelling.     Tonsils: No tonsillar exudate or tonsillar abscesses.     Comments: Thick streaks of postnasal drainage overlying pharynx.  Eyes:     General: No scleral icterus.       Right eye: No discharge.        Left eye: No discharge.     Extraocular Movements: Extraocular movements intact.     Right eye: Normal extraocular motion.     Left eye: Normal extraocular motion.     Conjunctiva/sclera: Conjunctivae normal.  Cardiovascular:     Rate and Rhythm: Normal rate and regular rhythm.     Heart sounds: Normal heart sounds. No murmur heard.    No friction rub. No gallop.  Pulmonary:     Effort: Pulmonary effort is normal. No respiratory distress.     Breath sounds: No stridor. No wheezing, rhonchi or rales.  Chest:     Chest wall: No tenderness.  Musculoskeletal:     Cervical back: Normal range of motion and neck supple.  Lymphadenopathy:     Cervical: No cervical adenopathy.  Skin:    General: Skin is warm and dry.  Neurological:     General: No focal deficit present.     Mental Status: She is alert and oriented to person,  place, and time.  Psychiatric:        Mood and Affect: Mood normal.        Behavior: Behavior normal.     Assessment and Plan :   PDMP not reviewed this encounter.  1. Viral respiratory infection   2. Allergic rhinitis, unspecified seasonality, unspecified trigger     Suspect viral URI. Deferred imaging given clear cardiopulmonary exam, hemodynamically stable vital signs. Recommended an oral prednisone  course for her allergies rhinitis, respiratory symptoms, allergies. Restart oral allergy medication. Will use only 30mg  prednisone  for 5 days due to her history. Use Prilosec and Pepcid  for GI protection. Counseled patient on potential for adverse effects with medications prescribed/recommended today, ER and return-to-clinic precautions discussed, patient verbalized understanding.    Christopher Savannah, [place] 07/20/24 1052

## 2024-07-20 NOTE — Discharge Instructions (Addendum)
 We will manage this as a viral respiratory infection likely worsened by your allergies. For sore throat or cough try using a honey-based tea. Use 3 teaspoons of honey with juice squeezed from half lemon. Place shaved pieces of ginger into 1/2-1 cup of water and warm over stove top. Then mix the ingredients and repeat every 4 hours as needed. Please take Tylenol  500mg -650mg  once every 6 hours for fevers, aches and pains. Hydrate very well with at least 2 liters (64 ounces) of water. Eat light meals such as soups (chicken and noodles, chicken wild rice, vegetable).  Do not eat any foods that you are allergic to.  Start an antihistamine like Zyrtec (10mg  daily) for postnasal drainage, sinus congestion.  You can take this together with prednisone  to help your sinuses, coughing and allergies. Use cough syrup as needed.

## 2024-07-20 NOTE — ED Triage Notes (Addendum)
 Pt c/o prod cough, head/chest congestion x 4 days-feels is r/t allergies-taking advil and sudafed-NAD-steady gait

## 2024-08-04 ENCOUNTER — Ambulatory Visit
Admission: EM | Admit: 2024-08-04 | Discharge: 2024-08-04 | Disposition: A | Discharge status: Home / Self Care | Source: Ambulatory Visit | Attending: Family Medicine | Admitting: Family Medicine

## 2024-08-04 DIAGNOSIS — B3789 Other sites of candidiasis: Secondary | ICD-10-CM | POA: Diagnosis not present

## 2024-08-04 DIAGNOSIS — L249 Irritant contact dermatitis, unspecified cause: Secondary | ICD-10-CM | POA: Diagnosis not present

## 2024-08-04 DIAGNOSIS — B372 Candidiasis of skin and nail: Secondary | ICD-10-CM | POA: Diagnosis not present

## 2024-08-04 MED ORDER — TRIAMCINOLONE ACETONIDE 0.1 % EX CREA: 1.0000 | TOPICAL_CREAM | Freq: Two times a day (BID) | CUTANEOUS | 0 refills | Status: DC

## 2024-08-04 MED ORDER — HYDROXYZINE HCL 25 MG PO TABS: 12.5000 mg | ORAL_TABLET | Freq: Three times a day (TID) | ORAL | 0 refills | Status: DC | PRN

## 2024-08-04 MED ORDER — HYDROCORTISONE 1 % EX CREA: TOPICAL_CREAM | Freq: Two times a day (BID) | CUTANEOUS | 0 refills | Status: DC

## 2024-08-04 MED ORDER — FLUCONAZOLE 150 MG PO TABS: 150.0000 mg | ORAL_TABLET | ORAL | 0 refills | Status: DC

## 2024-08-04 NOTE — Discharge Instructions (Addendum)
 Start oral fluconazole  for the yeast infection of the skin on your torso, breast area. Use hydrocortisone  cream for the irritant dermatitis on the face and triamcinolone  on the left forearm. Use hydroxyzine  for itching as needed.

## 2024-08-04 NOTE — ED Provider Notes (Signed)
 [facility]  Note:  This document was prepared using Conservation officer, historic buildings and may include unintentional dictation errors.  MRN: [id number] DOB: October 17, 1961  Subjective:   Latasha Hicks is a 63 y.o. female presenting for 4-day history of itchy rash over the left wrist, forearm and left side of her face.  Patient is concerned about poison oak.  Was doing a lot of yard work and unfortunately came into contact with it.  She has also had an itching rash under the breasts and lower abdominal area.  No drainage of pus or bleeding, fevers.  Patient did just undergo a prednisone  course given significant respiratory symptoms, severe allergic rhinitis.  She did complete this course without any issues apart from the rashes she is experiencing on her torso.  No current facility-administered medications for this encounter.  Current Outpatient Medications:    allopurinol  (ZYLOPRIM ) 100 MG tablet, Take 1 tablet (100 mg total) by mouth daily., Disp: 90 tablet, Rfl: 3   azithromycin  (ZITHROMAX ) 250 MG tablet, Day 1: take 2 tablets. Day 2-5: Take 1 tablet daily., Disp: 6 tablet, Rfl: 0   b complex vitamins tablet, Take 1 tablet by mouth daily., Disp: 100 tablet, Rfl: 3   Cholecalciferol (VITAMIN D3) 50 MCG (2000 UT) capsule, Take 1 capsule (2,000 Units total) by mouth daily., Disp: 100 capsule, Rfl: 3   Cyanocobalamin  (VITAMIN B-12) 1000 MCG SUBL, Place 1 tablet (1,000 mcg total) under the tongue daily., Disp: 100 tablet, Rfl: 3   cyclobenzaprine  (FLEXERIL ) 5 MG tablet, Take 1 tablet (5 mg total) by mouth at bedtime., Disp: 30 tablet, Rfl: 0   famotidine  (PEPCID ) 20 MG tablet, Take 1 tablet (20 mg total) by mouth 2 (two) times daily., Disp: 30 tablet, Rfl: 0   furosemide  (LASIX ) 20 MG tablet, TAKE 1 TABLET BY MOUTH EVERY DAY AS NEEDED, Disp: 90 tablet, Rfl: 1   meloxicam  (MOBIC ) 15 MG tablet, Take 1 tablet (15 mg total) by mouth daily., Disp: 30 tablet, Rfl: 0    omeprazole  (PRILOSEC) 20 MG capsule, Take 1 capsule (20 mg total) by mouth daily., Disp: 30 capsule, Rfl: 0   potassium citrate  (UROCIT-K ) 5 MEQ (540 MG) SR tablet, Take 1 tablet (5 mEq total) by mouth 3 (three) times daily with meals., Disp: 90 tablet, Rfl: 11   predniSONE  (DELTASONE ) 10 MG tablet, Take 3 tablets (30 mg total) by mouth daily with breakfast., Disp: 15 tablet, Rfl: 0   Probiotic Product (PROBIOTIC-10 PO), Take by mouth., Disp: , Rfl:    promethazine -dextromethorphan (PROMETHAZINE -DM) 6.25-15 MG/5ML syrup, Take 5 mLs by mouth 3 (three) times daily as needed for cough., Disp: 200 mL, Rfl: 0   Allergies  Allergen Reactions   Clarithromycin     REACTION: gi upset   Tetanus Toxoid-Containing Vaccines Other (See Comments)    Right arm large erythem reaction spontaneously improved    Past Medical History:  Diagnosis Date   Allergic rhinitis    Complication of anesthesia    hard to wake up in past   COVID-19 09/2021   Endometriosis    Hematuria    d/t kidney stones    History of kidney stones    History of sleep apnea    NO OSA SINCE WEIGHT LOSS SURGERY AND WEIGHT LOSS 2009   Hydronephrosis    Kidney stones    Neck pain 07/05/2016   Osteoarthritis    Personal history of colonic polyps    Question accuracy, patient report only.  Sleep apnea    Urinary incontinence    VENOUS INSUFFICIENCY, CHRONIC 07/05/2007   Annotation: LE Qualifier: Diagnosis of  By: Dance CMA (AAMA), Kim     Vitamin D  deficiency      Past Surgical History:  Procedure Laterality Date   ABDOMINAL SURGERY     ACHILLES TENDON SURGERY Left 12/2017   BREAST CYST EXCISION Left    COLONOSCOPY N/A 09/04/2013   Procedure: COLONOSCOPY;  Surgeon: Lamar JONETTA Aho, MD;  Location: [facility];  Service: Endoscopy;  Laterality: N/A;   COLONOSCOPY W/ POLYPECTOMY     CYSTOSCOPY WITH RETROGRADE PYELOGRAM, URETEROSCOPY AND STENT PLACEMENT Left 01/30/2019   Procedure: CYSTOSCOPY WITH RETROGRADE PYELOGRAM,  URETEROSCOPY AND STENT PLACEMENT;  Surgeon: Sherrilee Belvie CROME, MD;  Location: [facility];  Service: Urology;  Laterality: Left;  1 HR   CYSTOSCOPY WITH RETROGRADE PYELOGRAM, URETEROSCOPY AND STENT PLACEMENT Bilateral 04/19/2020   Procedure: CYSTOSCOPY WITH RETROGRADE PYELOGRAM, URETEROSCOPY AND STENT PLACEMENT;  Surgeon: Sherrilee Belvie CROME, MD;  Location: [facility];  Service: Urology;  Laterality: Bilateral;  2 HRS   CYSTOSCOPY WITH RETROGRADE PYELOGRAM, URETEROSCOPY AND STENT PLACEMENT N/A 05/17/2020   Procedure: CYSTOSCOPY WITH RIGHT RETROGRADE PYELOGRAM, INTRAOPERATIVE FLUOROSCOPY, under one hour, with INTERPRETATION, RIGHT DIAGNOSTIC URETEROSCOPY AND BILATERAL URETERAL STENT REMOVAL;  Surgeon: Sherrilee Belvie CROME, MD;  Location: [facility];  Service: Urology;  Laterality: N/A;  30 MINS   DILATION AND CURETTAGE OF UTERUS     HIATAL HERNIA REPAIR     HOLMIUM LASER APPLICATION Left 01/30/2019   Procedure: HOLMIUM LASER APPLICATION;  Surgeon: Sherrilee Belvie CROME, MD;  Location: [facility];  Service: Urology;  Laterality: Left;   HOLMIUM LASER APPLICATION Bilateral 04/19/2020   Procedure: HOLMIUM LASER APPLICATION;  Surgeon: Sherrilee Belvie CROME, MD;  Location: [facility];  Service: Urology;  Laterality: Bilateral;   lap band replacement  2012   LAPAROSCOPIC CHOLECYSTECTOMY     LAPAROSCOPIC GASTRIC BANDING  2009   TOTAL KNEE ARTHROPLASTY Left 09/29/2018   Procedure: LEFT TOTAL KNEE ARTHROPLASTY;  Surgeon: Jerri Kay HERO, MD;  Location: [facility];  Service: Orthopedics;  Laterality: Left;   VAGINAL HYSTERECTOMY     partial    Family History  Problem Relation Age of Onset   Diabetes Mother    Cervical cancer Mother    Hypertension Mother    Rectal cancer Neg Hx    Stomach cancer Neg Hx    Heart disease Neg Hx    Colon cancer Neg Hx     Social History   Tobacco Use   Smoking status: Never   Smokeless tobacco: Never  Vaping Use   Vaping status: Never  Used  Substance Use Topics   Alcohol use: Yes    Comment: occ   Drug use: No    ROS   Objective:   Vitals: BP 128/70 (BP Location: Left Arm)   Pulse 64   Temp 98.3 F (36.8 C) (Oral)   Resp 17   SpO2 94%   Physical Exam Constitutional:      General: She is not in acute distress.    Appearance: Normal appearance. She is well-developed. She is not ill-appearing, toxic-appearing or diaphoretic.  HENT:     Head: Normocephalic and atraumatic.     Nose: Nose normal.     Mouth/Throat:     Mouth: Mucous membranes are moist.  Eyes:     General: No scleral icterus.       Right eye: No discharge.  Left eye: No discharge.     Extraocular Movements: Extraocular movements intact.  Cardiovascular:     Rate and Rhythm: Normal rate.  Pulmonary:     Effort: Pulmonary effort is normal.  Skin:    General: Skin is warm and dry.     Findings: Rash (macular prupritic rash in the inferior breast folds and under the pannus extending toward the groin area; maculopapular patches along the left dorsal forearm to a lesser degree the left side of her face) present.  Neurological:     General: No focal deficit present.     Mental Status: She is alert and oriented to person, place, and time.  Psychiatric:        Mood and Affect: Mood normal.        Behavior: Behavior normal.     Assessment and Plan :   PDMP not reviewed this encounter.  1. Candidiasis of breast   2. Yeast infection of the skin   3. Irritant dermatitis    Will manage for 2 distinct rashes.  Use oral fluconazole  for candidiasis of the skin involving the breast and pannus.  Use topical steroids for irritant dermatitis secondary to poison oak, irritant plants.  Hydroxyzine  for itching as needed.  Counseled patient on potential for adverse effects with medications prescribed/recommended today, ER and return-to-clinic precautions discussed, patient verbalized understanding.    Christopher Savannah, [place] 08/04/24 [id number]

## 2024-08-04 NOTE — ED Triage Notes (Signed)
 Pt c/o scattered rash x 4 days-using calamine lotion, anti itching lotion-NAD-steady gait

## 2024-10-06 ENCOUNTER — Other Ambulatory Visit: Payer: Self-pay | Admitting: Internal Medicine

## 2024-10-28 ENCOUNTER — Telehealth: Payer: Self-pay | Admitting: Internal Medicine

## 2024-10-28 NOTE — Telephone Encounter (Unsigned)
 Copied from CRM #[id number]. Topic: Clinical - Medication Refill >> Oct 28, 2024  1:10 PM China J wrote: Medication:  famotidine  (PEPCID ) 20 MG tablet  Has the patient contacted their pharmacy? Yes (Agent: If no, request that the patient contact the pharmacy for the refill. If patient does not wish to contact the pharmacy document the reason why and proceed with request.) (Agent: If yes, when and what did the pharmacy advise?) Pharmacy instructed patient to call primary.  This is the patient's preferred pharmacy:  [id number] - [place], [place] - [address] AT [facility] [address] [place] [address] Phone: [phone] Fax: [phone]  Is this the correct pharmacy for this prescription? Yes If no, delete pharmacy and type the correct one.   Has the prescription been filled recently? No  Is the patient out of the medication? No  Has the patient been seen for an appointment in the last year OR does the patient have an upcoming appointment? Yes  Can we respond through MyChart? Yes  Agent: Please be advised that Rx refills may take up to 3 business days. We ask that you follow-up with your pharmacy.

## 2024-11-01 MED ORDER — FAMOTIDINE 20 MG PO TABS: 20.0000 mg | ORAL_TABLET | Freq: Two times a day (BID) | ORAL | 3 refills | Status: AC

## 2024-12-15 ENCOUNTER — Encounter: Payer: Self-pay | Admitting: Internal Medicine

## 2024-12-15 ENCOUNTER — Ambulatory Visit: Admitting: Internal Medicine

## 2024-12-15 VITALS — BP 122/73 | HR 60 | Ht 65.0 in | Wt 250.2 lb

## 2024-12-15 DIAGNOSIS — Z6841 Body Mass Index (BMI) 40.0 and over, adult: Secondary | ICD-10-CM | POA: Diagnosis not present

## 2024-12-15 DIAGNOSIS — G4733 Obstructive sleep apnea (adult) (pediatric): Secondary | ICD-10-CM | POA: Diagnosis not present

## 2024-12-15 DIAGNOSIS — R739 Hyperglycemia, unspecified: Secondary | ICD-10-CM | POA: Diagnosis not present

## 2024-12-15 DIAGNOSIS — E559 Vitamin D deficiency, unspecified: Secondary | ICD-10-CM

## 2024-12-15 DIAGNOSIS — H00012 Hordeolum externum right lower eyelid: Secondary | ICD-10-CM | POA: Diagnosis not present

## 2024-12-15 DIAGNOSIS — Z13220 Encounter for screening for lipoid disorders: Secondary | ICD-10-CM | POA: Diagnosis not present

## 2024-12-15 DIAGNOSIS — Z0000 Encounter for general adult medical examination without abnormal findings: Secondary | ICD-10-CM

## 2024-12-15 DIAGNOSIS — N200 Calculus of kidney: Secondary | ICD-10-CM | POA: Diagnosis not present

## 2024-12-15 LAB — COMPREHENSIVE METABOLIC PANEL WITH GFR
ALT: 20 U/L (ref 3–35)
AST: 27 U/L (ref 5–37)
Albumin: 3.7 g/dL (ref 3.5–5.2)
Alkaline Phosphatase: 52 U/L (ref 39–117)
BUN: 16 mg/dL (ref 6–23)
CO2: 24 meq/L (ref 19–32)
Calcium: 9.1 mg/dL (ref 8.4–10.5)
Chloride: 110 meq/L (ref 96–112)
Creatinine, Ser: 0.75 mg/dL (ref 0.40–1.20)
GFR: 85.53 mL/min
Glucose, Bld: 97 mg/dL (ref 70–99)
Potassium: 4 meq/L (ref 3.5–5.1)
Sodium: 139 meq/L (ref 135–145)
Total Bilirubin: 0.6 mg/dL (ref 0.2–1.2)
Total Protein: 6.9 g/dL (ref 6.0–8.3)

## 2024-12-15 LAB — CBC WITH DIFFERENTIAL/PLATELET
Basophils Absolute: 0 K/uL (ref 0.0–0.1)
Basophils Relative: 0.4 % (ref 0.0–3.0)
Eosinophils Absolute: 0.1 K/uL (ref 0.0–0.7)
Eosinophils Relative: 1.9 % (ref 0.0–5.0)
HCT: 44.9 % (ref 36.0–46.0)
Hemoglobin: 15 g/dL (ref 12.0–15.0)
Lymphocytes Relative: 21.4 % (ref 12.0–46.0)
Lymphs Abs: 1.4 K/uL (ref 0.7–4.0)
MCHC: 33.4 g/dL (ref 30.0–36.0)
MCV: 91.6 fl (ref 78.0–100.0)
Monocytes Absolute: 0.6 K/uL (ref 0.1–1.0)
Monocytes Relative: 9.9 % (ref 3.0–12.0)
Neutro Abs: 4.2 K/uL (ref 1.4–7.7)
Neutrophils Relative %: 66.4 % (ref 43.0–77.0)
Platelets: 223 K/uL (ref 150.0–400.0)
RBC: 4.9 Mil/uL (ref 3.87–5.11)
RDW: 14.3 % (ref 11.5–15.5)
WBC: 6.3 K/uL (ref 4.0–10.5)

## 2024-12-15 LAB — URINALYSIS
Bilirubin Urine: NEGATIVE
Hgb urine dipstick: NEGATIVE
Ketones, ur: NEGATIVE
Leukocytes,Ua: NEGATIVE
Nitrite: NEGATIVE
Specific Gravity, Urine: 1.025 (ref 1.000–1.030)
Total Protein, Urine: NEGATIVE
Urine Glucose: NEGATIVE
Urobilinogen, UA: 0.2 (ref 0.0–1.0)
pH: 6 (ref 5.0–8.0)

## 2024-12-15 LAB — LIPID PANEL
Cholesterol: 222 mg/dL — ABNORMAL HIGH (ref 28–200)
HDL: 41.9 mg/dL
LDL Cholesterol: 155 mg/dL — ABNORMAL HIGH (ref 10–99)
NonHDL: 179.76
Total CHOL/HDL Ratio: 5
Triglycerides: 126 mg/dL (ref 10.0–149.0)
VLDL: 25.2 mg/dL (ref 0.0–40.0)

## 2024-12-15 LAB — HEMOGLOBIN A1C: Hgb A1c MFr Bld: 5.9 % (ref 4.6–6.5)

## 2024-12-15 LAB — TSH: TSH: 1.52 u[IU]/mL (ref 0.35–5.50)

## 2024-12-15 MED ORDER — FLUCONAZOLE 150 MG PO TABS: 150.0000 mg | ORAL_TABLET | Freq: Once | ORAL | 1 refills | Status: AC

## 2024-12-15 MED ORDER — ERYTHROMYCIN 5 MG/GM OP OINT: 1.0000 | TOPICAL_OINTMENT | Freq: Three times a day (TID) | OPHTHALMIC | 1 refills | Status: AC

## 2024-12-15 MED ORDER — DOXYCYCLINE HYCLATE 100 MG PO TABS: 100.0000 mg | ORAL_TABLET | Freq: Two times a day (BID) | ORAL | 2 refills | Status: AC

## 2024-12-15 NOTE — Progress Notes (Signed)
 "  Subjective:  Patient ID: Latasha Hicks, female    DOB: 1961/04/26  Age: 64 y.o. MRN: [id number]  CC: Annual Exam (Annual Exam with fasting labs)   HPI Latasha Hicks presents for a well exam.  She is worried about cancer.  3 of her relatives died of cancer during past several months (2 of leukemia and one of pancreatic cancer) C/o R eye stye - lower lid C/o OSA, snoring -not using CPAP She is here with her husband  Outpatient Medications Prior to Visit  Medication Sig Dispense Refill   allopurinol  (ZYLOPRIM ) 100 MG tablet Take 1 tablet (100 mg total) by mouth daily. 90 tablet 3   b complex vitamins tablet Take 1 tablet by mouth daily. 100 tablet 3   Cholecalciferol (VITAMIN D3) 50 MCG (2000 UT) capsule Take 1 capsule (2,000 Units total) by mouth daily. 100 capsule 3   Cyanocobalamin  (VITAMIN B-12) 1000 MCG SUBL Place 1 tablet (1,000 mcg total) under the tongue daily. 100 tablet 3   famotidine  (PEPCID ) 20 MG tablet Take 1 tablet (20 mg total) by mouth 2 (two) times daily. 90 tablet 3   Probiotic Product (PROBIOTIC-10 PO) Take by mouth.     cyclobenzaprine  (FLEXERIL ) 5 MG tablet Take 1 tablet (5 mg total) by mouth at bedtime. (Patient not taking: Reported on 12/15/2024) 30 tablet 0   meloxicam  (MOBIC ) 15 MG tablet Take 1 tablet (15 mg total) by mouth daily. (Patient not taking: Reported on 12/15/2024) 30 tablet 0   azithromycin  (ZITHROMAX ) 250 MG tablet Day 1: take 2 tablets. Day 2-5: Take 1 tablet daily. 6 tablet 0   fluconazole  (DIFLUCAN ) 150 MG tablet Take 1 tablet (150 mg total) by mouth every 3 (three) days. 5 tablet 0   furosemide  (LASIX ) 20 MG tablet TAKE 1 TABLET BY MOUTH EVERY DAY AS NEEDED 90 tablet 1   hydrocortisone  cream 1 % Apply topically 2 (two) times daily. 30 g 0   hydrOXYzine  (ATARAX ) 25 MG tablet Take 0.5-1 tablets (12.5-25 mg total) by mouth every 8 (eight) hours as needed for itching. 30 tablet 0   omeprazole  (PRILOSEC) 20 MG capsule Take 1 capsule (20 mg  total) by mouth daily. 30 capsule 0   potassium citrate  (UROCIT-K ) 5 MEQ (540 MG) SR tablet Take 1 tablet (5 mEq total) by mouth 3 (three) times daily with meals. 90 tablet 11   predniSONE  (DELTASONE ) 10 MG tablet Take 3 tablets (30 mg total) by mouth daily with breakfast. 15 tablet 0   promethazine -dextromethorphan (PROMETHAZINE -DM) 6.25-15 MG/5ML syrup Take 5 mLs by mouth 3 (three) times daily as needed for cough. 200 mL 0   triamcinolone  cream (KENALOG ) 0.1 % Apply 1 Application topically 2 (two) times daily. 30 g 0   No facility-administered medications prior to visit.    ROS: Review of Systems  Constitutional:  Negative for activity change, appetite change, chills, fatigue and unexpected weight change.  HENT:  Negative for congestion, mouth sores and sinus pressure.   Eyes:  Negative for visual disturbance.  Respiratory:  Negative for cough and chest tightness.   Gastrointestinal:  Negative for abdominal pain and nausea.  Genitourinary:  Negative for difficulty urinating, frequency and vaginal pain.  Musculoskeletal:  Negative for back pain and gait problem.  Skin:  Negative for pallor and rash.  Neurological:  Negative for dizziness, tremors, weakness, numbness and headaches.  Psychiatric/Behavioral:  Negative for confusion and sleep disturbance.     Objective:  BP 122/73   Pulse 60  Ht 5' 5 (1.651 m)   Wt 250 lb 3.2 oz (113.5 kg)   SpO2 97%   BMI 41.64 kg/m   BP Readings from Last 3 Encounters:  12/15/24 122/73  08/04/24 128/70  07/20/24 116/78    Wt Readings from Last 3 Encounters:  12/15/24 250 lb 3.2 oz (113.5 kg)  07/15/23 248 lb (112.5 kg)  07/02/23 248 lb (112.5 kg)    Physical Exam Constitutional:      General: She is not in acute distress.    Appearance: She is well-developed. She is obese.  HENT:     Head: Normocephalic.     Right Ear: External ear normal.     Left Ear: External ear normal.     Nose: Nose normal.  Eyes:     General:         Right eye: No discharge.        Left eye: No discharge.     Conjunctiva/sclera: Conjunctivae normal.     Pupils: Pupils are equal, round, and reactive to light.  Neck:     Thyroid : No thyromegaly.     Vascular: No JVD.     Trachea: No tracheal deviation.  Cardiovascular:     Rate and Rhythm: Normal rate and regular rhythm.     Heart sounds: Normal heart sounds.  Pulmonary:     Effort: No respiratory distress.     Breath sounds: No stridor. No wheezing.  Abdominal:     General: Bowel sounds are normal. There is no distension.     Palpations: Abdomen is soft. There is no mass.     Tenderness: There is no abdominal tenderness. There is no guarding or rebound.  Musculoskeletal:        General: No tenderness.     Cervical back: Normal range of motion and neck supple. No rigidity.  Lymphadenopathy:     Cervical: No cervical adenopathy.  Skin:    Findings: No erythema or rash.  Neurological:     Cranial Nerves: No cranial nerve deficit.     Motor: No abnormal muscle tone.     Coordination: Coordination normal.     Gait: Gait normal.     Deep Tendon Reflexes: Reflexes normal.  Psychiatric:        Behavior: Behavior normal.        Thought Content: Thought content normal.        Judgment: Judgment normal.     Lab Results  Component Value Date   WBC 7.6 05/24/2022   HGB 14.6 05/24/2022   HCT 45.3 05/24/2022   PLT 246.0 05/24/2022   GLUCOSE 108 (H) 05/24/2022   CHOL 234 (H) 05/24/2022   TRIG 115.0 05/24/2022   HDL 49.50 05/24/2022   LDLDIRECT 181.4 05/01/2013   LDLCALC 162 (H) 05/24/2022   ALT 36 (H) 05/24/2022   AST 32 05/24/2022   NA 138 05/24/2022   K 4.3 05/24/2022   CL 107 05/24/2022   CREATININE 0.86 05/24/2022   BUN 18 05/24/2022   CO2 25 05/24/2022   TSH 2.82 05/24/2022   INR 0.99 09/19/2018   HGBA1C 6.0 02/06/2008    No results found.  Assessment & Plan:   Problem List Items Addressed This Visit     Vitamin D  deficiency   On Vit D      Relevant  Medications   doxycycline  (VIBRA -TABS) 100 MG tablet   erythromycin  ophthalmic ointment   OBESITY, MORBID   Wt Readings from Last 3 Encounters:  12/15/24 250 lb  3.2 oz (113.5 kg)  07/15/23 248 lb (112.5 kg)  07/02/23 248 lb (112.5 kg)         OSA (obstructive sleep apnea)   Was on CPAP - intolerant Will ref to Dr Jude Russel option discussed too      Relevant Orders   Ambulatory referral to Pulmonology   Well adult exam - Primary    We discussed age appropriate health related issues, including available/recomended screening tests and vaccinations. Labs were ordered to be later reviewed . All questions were answered. We discussed one or more of the following - seat belt use, use of sunscreen/sun exposure exercise, fall risk reduction, second hand smoke exposure, firearm use and storage, seat belt use, a need for adhering to healthy diet and exercise. Labs were ordered.  All questions were answered. Colon 2014, 2023  - Dr Avram Mammogram yearly Schedule Pap smear Shingrix vaccine was advised      Relevant Orders   TSH   Urinalysis   CBC with Differential/Platelet   Lipid panel   Comprehensive metabolic panel with GFR   Stye   Doxy Erythro      Relevant Medications   doxycycline  (VIBRA -TABS) 100 MG tablet   erythromycin  ophthalmic ointment   Nephrolithiasis   No recent relapse      Hyperglycemia   Relevant Orders   Hemoglobin A1c      Meds ordered this encounter  Medications   doxycycline  (VIBRA -TABS) 100 MG tablet    Sig: Take 1 tablet (100 mg total) by mouth 2 (two) times daily.    Dispense:  14 tablet    Refill:  2   erythromycin  ophthalmic ointment    Sig: Place 1 Application into the right eye 3 (three) times daily.    Dispense:  3.5 g    Refill:  1   fluconazole  (DIFLUCAN ) 150 MG tablet    Sig: Take 1 tablet (150 mg total) by mouth once for 1 dose.    Dispense:  1 tablet    Refill:  1      Follow-up: Return in about 6 months (around  06/14/2025) for a follow-up visit.  Marolyn Noel, MD "

## 2024-12-15 NOTE — Assessment & Plan Note (Signed)
 Was on CPAP - intolerant Will ref to Dr Jude Russel option discussed too

## 2024-12-15 NOTE — Assessment & Plan Note (Signed)
 On Vit D

## 2024-12-15 NOTE — Assessment & Plan Note (Addendum)
" °  We discussed age appropriate health related issues, including available/recomended screening tests and vaccinations. Labs were ordered to be later reviewed . All questions were answered. We discussed one or more of the following - seat belt use, use of sunscreen/sun exposure exercise, fall risk reduction, second hand smoke exposure, firearm use and storage, seat belt use, a need for adhering to healthy diet and exercise. Labs were ordered.  All questions were answered. Colon 2014, 2023  - Dr Avram Mammogram yearly Schedule Pap smear Shingrix vaccine was advised "

## 2024-12-15 NOTE — Assessment & Plan Note (Signed)
 Wt Readings from Last 3 Encounters:  12/15/24 250 lb 3.2 oz (113.5 kg)  07/15/23 248 lb (112.5 kg)  07/02/23 248 lb (112.5 kg)

## 2024-12-15 NOTE — Assessment & Plan Note (Signed)
 Doxy Erythro

## 2024-12-15 NOTE — Assessment & Plan Note (Signed)
No recent relapse 

## 2024-12-15 NOTE — Patient Instructions (Addendum)
 USEFUL THINGS FOR ARTHRITIS and musculoskeletal pains:    A rice sock heating pad refers to a homemade heating pad created by filling a sock with uncooked rice, which can be heated in a microwave to provide a warm compress for sore muscles, pain relief, or other applications; essentially, it's a simple way to generate heat using readily available materials.  Key points about rice sock heat: How to make it: Fill a clean sock (preferably a tube sock) about 2/3 full with uncooked rice, tie a knot at the top to secure the rice inside.  Heating it up: Place the rice sock in the microwave and heat in short intervals (usually around 30 seconds at a time) until it reaches the desired warmth.  Important considerations: Check temperature before applying: Always test the temperature of the rice sock before applying it to your skin to avoid burns.  Use a towel to protect skin: Wrap the rice sock in a thin towel to distribute the heat evenly and protect your skin.  Uses: Muscle aches and pains  Menstrual cramps  Neck pain  Arthritis discomfort     How Inspire Treatment Works Before retiring for the night, patients activate the [employer] device using a compact, handheld remote control designed for the [employer] system. As the patient rests, Elizabeth diligently tracks each breath taken. It provides gentle, consistent stimulation to the hypoglossal nerve, which in turn prompts the tongue and surrounding soft tissues to shift away from the airway. This action reinstates tone in the upper airway, facilitating unobstructed breathing while the patient sleeps.  The Inspire system is composed of a stimulation lead coupled with a breathing sensor, both of which are energized by a diminutive battery. The entire mechanism is surgically implanted beneath the skin in the region of the neck and chest via two or three minimal incisions. This is an outpatient procedure, typically allowing patients to return home on the  same day and manage any discomfort with over-the-counter pain relief if necessary.  The effectiveness and safety profile of the Inspire system were rigorously evaluated during the STAR clinical trial. The one-year results of the STAR trial were disclosed in the December 04, 2012, issue of the [place] Journal of Medicine. The findings revealed that patients utilizing Inspire experienced notable decreases in sleep apnea episodes and substantial enhancements in quality of life metrics. These positive outcomes were maintained throughout a five-year follow-up duration. To date, Inspire therapy has been the subject of over 150 peer-reviewed articles.    -----------------------------------------------------------------------------------------------  Recent large-scale observational studies provide strong evidence that the shingles vaccine is associated with a reduced risk of developing dementia. It may also help slow cognitive decline in individuals who already have dementia.  Key Findings from Recent Research Multiple natural experiment studies, which leveraged unique vaccination policies to minimize bias, have consistently found a protective link:  Reduced Risk: One large study, published in [place], analyzed health records from over 280,000 older adults in [place] and found that those who received the live-attenuated shingles vaccine (Zostavax, now discontinued in the [place] ) were approximately 20% less likely to develop dementia over a seven-year follow-up period. Slower Progression: A follow-up study published in Cell indicated that for those already living with dementia, the vaccine appeared to slow the progression of the disease and reduced dementia-related deaths by nearly half over nine years. Vascular Dementia Protection: Other research presented at [facility] 2025 indicated that the vaccine lowered the risk of vascular dementia by 50%. Newer Vaccine: A separate study published  in [employer] [facility] suggested  that the newer, more effective recombinant vaccine (Shingrix, currently used in the [place] ) also offers significant protection against dementia, with an association of lower risk than the older vaccine type. Stronger Effect in Women: Several studies noted that the protective effect against dementia was more pronounced in women than in men, possibly due to differences in immune responses or dementia pathogenesis.  Why Might the Vaccine Help? Researchers are still working to determine the exact mechanism, but current theories center on the idea that preventing the shingles virus (varicella-zoster) from reactivating helps protect the brain:  Reduced Inflammation: The most likely explanation is that the vaccine prevents shingles infections and subsequent inflammation in the nervous system, which is a known risk factor for neurodegenerative diseases like dementia. Immune System Modulation: It's also possible that the vaccine boosts the immune system more broadly, helping the body combat other processes related to cognitive decline.  Important Considerations Observational Data: While the evidence is strong, these findings primarily come from observational studies, which show an association, not a definitive cause-and-effect relationship. A large-scale randomized controlled trial is the gold standard for conclusive proof, but such trials are difficult to conduct for dementia due to the time and cost involved. Public Health Recommendation: The [place].S. Centers for Disease Control and Prevention (CDC) already recommends the two-dose Shingrix vaccine for all healthy adults aged 70 and older primarily to prevent shingles and its painful complications. The potential added benefit for dementia prevention provides another compelling reason to get vaccinated.

## 2024-12-17 ENCOUNTER — Ambulatory Visit: Payer: Self-pay | Admitting: Internal Medicine

## 2024-12-17 DIAGNOSIS — E785 Hyperlipidemia, unspecified: Secondary | ICD-10-CM

## 2025-01-07 ENCOUNTER — Other Ambulatory Visit

## 2025-02-03 ENCOUNTER — Ambulatory Visit: Admitting: Primary Care

## 2025-06-14 ENCOUNTER — Ambulatory Visit: Admitting: Internal Medicine
